# Patient Record
Sex: Female | Born: 1945
Health system: Southern US, Community
[De-identification: ages and names within clinical notes are randomized; demographics above are authoritative.]

## PROBLEM LIST (undated history)

## (undated) DIAGNOSIS — C801 Malignant (primary) neoplasm, unspecified: Secondary | ICD-10-CM

## (undated) DIAGNOSIS — E785 Hyperlipidemia, unspecified: Secondary | ICD-10-CM

## (undated) DIAGNOSIS — M1990 Unspecified osteoarthritis, unspecified site: Secondary | ICD-10-CM

## (undated) DIAGNOSIS — Z85828 Personal history of other malignant neoplasm of skin: Secondary | ICD-10-CM

## (undated) DIAGNOSIS — K219 Gastro-esophageal reflux disease without esophagitis: Secondary | ICD-10-CM

## (undated) DIAGNOSIS — K259 Gastric ulcer, unspecified as acute or chronic, without hemorrhage or perforation: Secondary | ICD-10-CM

## (undated) DIAGNOSIS — I499 Cardiac arrhythmia, unspecified: Secondary | ICD-10-CM

## (undated) DIAGNOSIS — J45909 Unspecified asthma, uncomplicated: Secondary | ICD-10-CM

## (undated) DIAGNOSIS — T7840XA Allergy, unspecified, initial encounter: Secondary | ICD-10-CM

## (undated) HISTORY — DX: Malignant (primary) neoplasm, unspecified: C80.1

## (undated) HISTORY — DX: Gastric ulcer, unspecified as acute or chronic, without hemorrhage or perforation: K25.9

## (undated) HISTORY — PX: ABDOMINAL HYSTERECTOMY: SHX81

## (undated) HISTORY — DX: Unspecified osteoarthritis, unspecified site: M19.90

## (undated) HISTORY — PX: COLONOSCOPY: SHX174

## (undated) HISTORY — DX: Allergy, unspecified, initial encounter: T78.40XA

## (undated) HISTORY — DX: Gastro-esophageal reflux disease without esophagitis: K21.9

## (undated) HISTORY — DX: Hyperlipidemia, unspecified: E78.5

## (undated) HISTORY — PX: CATARACT EXTRACTION: SUR2

## (undated) HISTORY — DX: Unspecified asthma, uncomplicated: J45.909

## (undated) HISTORY — DX: Personal history of other malignant neoplasm of skin: Z85.828

---

## 2010-03-05 DIAGNOSIS — M47812 Spondylosis without myelopathy or radiculopathy, cervical region: Secondary | ICD-10-CM | POA: Insufficient documentation

## 2014-12-12 ENCOUNTER — Encounter: Payer: Self-pay | Admitting: Internal Medicine

## 2014-12-12 ENCOUNTER — Ambulatory Visit (INDEPENDENT_AMBULATORY_CARE_PROVIDER_SITE_OTHER): Payer: Medicare PPO | Admitting: Internal Medicine

## 2014-12-12 ENCOUNTER — Telehealth: Payer: Self-pay | Admitting: Internal Medicine

## 2014-12-12 VITALS — BP 138/82 | HR 62 | Temp 98.0°F | Resp 14 | Ht 64.0 in | Wt 136.4 lb

## 2014-12-12 DIAGNOSIS — E785 Hyperlipidemia, unspecified: Secondary | ICD-10-CM

## 2014-12-12 DIAGNOSIS — Z780 Asymptomatic menopausal state: Secondary | ICD-10-CM | POA: Diagnosis not present

## 2014-12-12 DIAGNOSIS — Z23 Encounter for immunization: Secondary | ICD-10-CM | POA: Diagnosis not present

## 2014-12-12 DIAGNOSIS — K219 Gastro-esophageal reflux disease without esophagitis: Secondary | ICD-10-CM

## 2014-12-12 DIAGNOSIS — Z85828 Personal history of other malignant neoplasm of skin: Secondary | ICD-10-CM

## 2014-12-12 MED ORDER — TRETINOIN 0.025 % EX CREA: TOPICAL_CREAM | Freq: Every day | CUTANEOUS | Status: DC

## 2014-12-12 MED ORDER — ESTRADIOL 0.1 MG/GM VA CREA: TOPICAL_CREAM | VAGINAL | Status: DC

## 2014-12-12 MED ORDER — AZELAIC ACID 15 % EX GEL: CUTANEOUS | Status: DC

## 2014-12-12 MED ORDER — SIMVASTATIN 5 MG PO TABS: 5.0000 mg | ORAL_TABLET | Freq: Every day | ORAL | Status: DC

## 2014-12-12 MED ORDER — AZELAIC ACID 15 % EX FOAM: 15.0000 g | Freq: Two times a day (BID) | CUTANEOUS | Status: DC

## 2014-12-12 NOTE — Progress Notes (Signed)
Pre visit review using our clinic review tool, if applicable. No additional management support is needed unless otherwise documented below in the visit note. 

## 2014-12-12 NOTE — Telephone Encounter (Signed)
Pharmacy needs the prescription for Azelaic Acid (FINACEA) 15 % cream [id number] to be a gel or foam. She states the cream doesn't exist. [facility] [employer] [facility] [address]

## 2014-12-12 NOTE — Telephone Encounter (Signed)
Printed and signed.  

## 2014-12-12 NOTE — Patient Instructions (Signed)
We have sent in the refills and will geHealth Maintenance, Female Adopting a healthy lifestyle and getting preventive care can go a long way to promote health and wellness. Talk with your health care provider about what schedule of regular examinations is right for you. This is a good chance for you to check in with your provider about disease prevention and staying healthy. In between checkups, there are plenty of things you can do on your own. Experts have done a lot of research about which lifestyle changes and preventive measures are most likely to keep you healthy. Ask your health care provider for more information. WEIGHT AND DIET  Eat a healthy diet  Be sure to include plenty of vegetables, fruits, low-fat dairy products, and lean protein.  Do not eat a lot of foods high in solid fats, added sugars, or salt.  Get regular exercise. This is one of the most important things you can do for your health.  Most adults should exercise for at least 150 minutes each week. The exercise should increase your heart rate and make you sweat (moderate-intensity exercise).  Most adults should also do strengthening exercises at least twice a week. This is in addition to the moderate-intensity exercise.  Maintain a healthy weight  Body mass index (BMI) is a measurement that can be used to identify possible weight problems. It estimates body fat based on height and weight. Your health care provider can help determine your BMI and help you achieve or maintain a healthy weight.  For females 42 years of age and older:   A BMI below 18.5 is considered underweight.  A BMI of 18.5 to 24.9 is normal.  A BMI of 25 to 29.9 is considered overweight.  A BMI of 30 and above is considered obese.  Watch levels of cholesterol and blood lipids  You should start having your blood tested for lipids and cholesterol at 71 years of age, then have this test every 5 years.  You may need to have your cholesterol levels  checked more often if:  Your lipid or cholesterol levels are high.  You are older than 71 years of age.  You are at high risk for heart disease.  CANCER SCREENING   Lung Cancer  Lung cancer screening is recommended for adults 67-66 years old who are at high risk for lung cancer because of a history of smoking.  A yearly low-dose CT scan of the lungs is recommended for people who:  Currently smoke.  Have quit within the past 15 years.  Have at least a 30-pack-year history of smoking. A pack year is smoking an average of one pack of cigarettes a day for 1 year.  Yearly screening should continue until it has been 15 years since you quit.  Yearly screening should stop if you develop a health problem that would prevent you from having lung cancer treatment.  Breast Cancer  Practice breast self-awareness. This means understanding how your breasts normally appear and feel.  It also means doing regular breast self-exams. Let your health care provider know about any changes, no matter how small.  If you are in your 20s or 30s, you should have a clinical breast exam (CBE) by a health care provider every 1-3 years as part of a regular health exam.  If you are 18 or older, have a CBE every year. Also consider having a breast X-ray (mammogram) every year.  If you have a family history of breast cancer, talk to your health  care provider about genetic screening.  If you are at high risk for breast cancer, talk to your health care provider about having an MRI and a mammogram every year.  Breast cancer gene (BRCA) assessment is recommended for women who have family members with BRCA-related cancers. BRCA-related cancers include:  Breast.  Ovarian.  Tubal.  Peritoneal cancers.  Results of the assessment will determine the need for genetic counseling and BRCA1 and BRCA2 testing. Cervical Cancer Your health care provider may recommend that you be screened regularly for cancer of the  pelvic organs (ovaries, uterus, and vagina). This screening involves a pelvic examination, including checking for microscopic changes to the surface of your cervix (Pap test). You may be encouraged to have this screening done every 3 years, beginning at age 74.  For women ages 37-65, health care providers may recommend pelvic exams and Pap testing every 3 years, or they may recommend the Pap and pelvic exam, combined with testing for human papilloma virus (HPV), every 5 years. Some types of HPV increase your risk of cervical cancer. Testing for HPV may also be done on women of any age with unclear Pap test results.  Other health care providers may not recommend any screening for nonpregnant women who are considered low risk for pelvic cancer and who do not have symptoms. Ask your health care provider if a screening pelvic exam is right for you.  If you have had past treatment for cervical cancer or a condition that could lead to cancer, you need Pap tests and screening for cancer for at least 20 years after your treatment. If Pap tests have been discontinued, your risk factors (such as having a new sexual partner) need to be reassessed to determine if screening should resume. Some women have medical problems that increase the chance of getting cervical cancer. In these cases, your health care provider may recommend more frequent screening and Pap tests. Colorectal Cancer  This type of cancer can be detected and often prevented.  Routine colorectal cancer screening usually begins at 71 years of age and continues through 71 years of age.  Your health care provider may recommend screening at an earlier age if you have risk factors for colon cancer.  Your health care provider may also recommend using home test kits to check for hidden blood in the stool.  A small camera at the end of a tube can be used to examine your colon directly (sigmoidoscopy or colonoscopy). This is done to check for the earliest  forms of colorectal cancer.  Routine screening usually begins at age 61.  Direct examination of the colon should be repeated every 5-10 years through 71 years of age. However, you may need to be screened more often if early forms of precancerous polyps or small growths are found. Skin Cancer  Check your skin from head to toe regularly.  Tell your health care provider about any new moles or changes in moles, especially if there is a change in a mole's shape or color.  Also tell your health care provider if you have a mole that is larger than the size of a pencil eraser.  Always use sunscreen. Apply sunscreen liberally and repeatedly throughout the day.  Protect yourself by wearing long sleeves, pants, a wide-brimmed hat, and sunglasses whenever you are outside. HEART DISEASE, DIABETES, AND HIGH BLOOD PRESSURE   High blood pressure causes heart disease and increases the risk of stroke. High blood pressure is more likely to develop in:  People  who have blood pressure in the high end of the normal range (130-139/85-89 mm Hg).  People who are overweight or obese.  People who are African American.  If you are 73-54 years of age, have your blood pressure checked every 3-5 years. If you are 55 years of age or older, have your blood pressure checked every year. You should have your blood pressure measured twice--once when you are at a hospital or clinic, and once when you are not at a hospital or clinic. Record the average of the two measurements. To check your blood pressure when you are not at a hospital or clinic, you can use:  An automated blood pressure machine at a pharmacy.  A home blood pressure monitor.  If you are between 11 years and 71 years old, ask your health care provider if you should take aspirin to prevent strokes.  Have regular diabetes screenings. This involves taking a blood sample to check your fasting blood sugar level.  If you are at a normal weight and have a low  risk for diabetes, have this test once every three years after 71 years of age.  If you are overweight and have a high risk for diabetes, consider being tested at a younger age or more often. PREVENTING INFECTION  Hepatitis B  If you have a higher risk for hepatitis B, you should be screened for this virus. You are considered at high risk for hepatitis B if:  You were born in a country where hepatitis B is common. Ask your health care provider which countries are considered high risk.  Your parents were born in a high-risk country, and you have not been immunized against hepatitis B (hepatitis B vaccine).  You have HIV or AIDS.  You use needles to inject street drugs.  You live with someone who has hepatitis B.  You have had sex with someone who has hepatitis B.  You get hemodialysis treatment.  You take certain medicines for conditions, including cancer, organ transplantation, and autoimmune conditions. Hepatitis C  Blood testing is recommended for:  Everyone born from [birth year] through 1967.  Anyone with known risk factors for hepatitis C. Sexually transmitted infections (STIs)  You should be screened for sexually transmitted infections (STIs) including gonorrhea and chlamydia if:  You are sexually active and are younger than 71 years of age.  You are older than 71 years of age and your health care provider tells you that you are at risk for this type of infection.  Your sexual activity has changed since you were last screened and you are at an increased risk for chlamydia or gonorrhea. Ask your health care provider if you are at risk.  If you do not have HIV, but are at risk, it may be recommended that you take a prescription medicine daily to prevent HIV infection. This is called pre-exposure prophylaxis (PrEP). You are considered at risk if:  You are sexually active and do not regularly use condoms or know the HIV status of your partner(s).  You take drugs by  injection.  You are sexually active with a partner who has HIV. Talk with your health care provider about whether you are at high risk of being infected with HIV. If you choose to begin PrEP, you should first be tested for HIV. You should then be tested every 3 months for as long as you are taking PrEP.  PREGNANCY   If you are premenopausal and you may become pregnant, ask your health care  provider about preconception counseling.  If you may become pregnant, take 400 to 800 micrograms (mcg) of folic acid every day.  If you want to prevent pregnancy, talk to your health care provider about birth control (contraception). OSTEOPOROSIS AND MENOPAUSE   Osteoporosis is a disease in which the bones lose minerals and strength with aging. This can result in serious bone fractures. Your risk for osteoporosis can be identified using a bone density scan.  If you are 80 years of age or older, or if you are at risk for osteoporosis and fractures, ask your health care provider if you should be screened.  Ask your health care provider whether you should take a calcium or vitamin D supplement to lower your risk for osteoporosis.  Menopause may have certain physical symptoms and risks.  Hormone replacement therapy may reduce some of these symptoms and risks. Talk to your health care provider about whether hormone replacement therapy is right for you.  HOME CARE INSTRUCTIONS   Schedule regular health, dental, and eye exams.  Stay current with your immunizations.   Do not use any tobacco products including cigarettes, chewing tobacco, or electronic cigarettes.  If you are pregnant, do not drink alcohol.  If you are breastfeeding, limit how much and how often you drink alcohol.  Limit alcohol intake to no more than 1 drink per day for nonpregnant women. One drink equals 12 ounces of beer, 5 ounces of wine, or 1 ounces of hard liquor.  Do not use street drugs.  Do not share needles.  Ask your  health care provider for help if you need support or information about quitting drugs.  Tell your health care provider if you often feel depressed.  Tell your health care provider if you have ever been abused or do not feel safe at home.   This information is not intended to replace advice given to you by your health care provider. Make sure you discuss any questions you have with your health care provider.   Document Released: 07/06/2010 Document Revised: 01/11/2014 Document Reviewed: 11/22/2012 Elsevier Interactive Patient Education [employer]. t you in with dermatology.   We have ordered the mammogram and you will hear back about scheduling that.   Come back in about 6-12 months for a physical and please feel free to call us sooner if you have any problems or questions.

## 2014-12-13 DIAGNOSIS — K219 Gastro-esophageal reflux disease without esophagitis: Secondary | ICD-10-CM | POA: Insufficient documentation

## 2014-12-13 DIAGNOSIS — Z780 Asymptomatic menopausal state: Secondary | ICD-10-CM | POA: Insufficient documentation

## 2014-12-13 DIAGNOSIS — Z85828 Personal history of other malignant neoplasm of skin: Secondary | ICD-10-CM | POA: Insufficient documentation

## 2014-12-13 DIAGNOSIS — E785 Hyperlipidemia, unspecified: Secondary | ICD-10-CM | POA: Insufficient documentation

## 2014-12-13 NOTE — Progress Notes (Signed)
   Subjective:    Patient ID: Brittany Harper, female    DOB: 1945/10/16, 69 y.o.   MRN: [id number]  HPI The patient is a 69 YO female coming in 69 y.o. new for continuity of her medical concerns. She moved from [place] to live in [place]. She has had high cholesterol for some time. She is taking simvastatin with good success and no side effects. Last cholesterol checked in the spring of this year. Please see A/P for status and treatment of other chronic medical problems. No new concerns today.   PMH, [facility], social history reviewed and updated.   Review of Systems  Constitutional: Negative for fever, activity change, appetite change, fatigue and unexpected weight change.  HENT: Negative.   Eyes: Negative.   Respiratory: Negative for cough, chest tightness, shortness of breath and wheezing.   Cardiovascular: Negative for chest pain, palpitations and leg swelling.  Gastrointestinal: Negative for nausea, abdominal pain, diarrhea, constipation and abdominal distention.  Musculoskeletal: Negative.   Skin: Negative.   Neurological: Negative.   Psychiatric/Behavioral: Negative.       Objective:   Physical Exam  Constitutional: She is oriented to person, place, and time. She appears well-developed and well-nourished.  HENT:  Head: Normocephalic and atraumatic.  Eyes: EOM are normal.  Neck: Normal range of motion.  Cardiovascular: Normal rate and regular rhythm.   Pulmonary/Chest: Effort normal and breath sounds normal. No respiratory distress. She has no wheezes. She has no rales.  Abdominal: Soft. Bowel sounds are normal. She exhibits no distension. There is no tenderness. There is no rebound.  Musculoskeletal: She exhibits no edema.  Neurological: She is alert and oriented to person, place, and time. Coordination normal.  Skin: Skin is warm and dry.  Psychiatric: She has a normal mood and affect.   Filed Vitals:   12/12/14 0834  BP: 138/82  Pulse: 62  Temp: 98 F (36.7 C)  TempSrc: Oral    Resp: 14  Height: 5\' 4"  (1.626 m)  Weight: 136 lb 6.4 oz (61.871 kg)  SpO2: 98%      Assessment & Plan:  Prevnar 13 given at visit.

## 2014-12-13 NOTE — Assessment & Plan Note (Signed)
Estrogen vaginal cream prescribed at today's visit for urinary symptoms (helps with her incontinence).

## 2014-12-13 NOTE — Assessment & Plan Note (Signed)
Cholesterol controlled on simvastatin 5 mg daily, reviewed most recent labs. No side effects. Refilled at today's visit.

## 2014-12-13 NOTE — Assessment & Plan Note (Signed)
Previously on omeprazole with good results. Now using zantac once daily with okay results (she was concerned about the studies that came out recently about the omeprazole).

## 2014-12-13 NOTE — Assessment & Plan Note (Signed)
No new or worrisome moles but likes to get yearly skin exam, referred to dermatology. She does use sunscreen when outdoors and reminded her of the importance of this.

## 2014-12-20 ENCOUNTER — Other Ambulatory Visit: Payer: Self-pay

## 2014-12-20 DIAGNOSIS — Z1231 Encounter for screening mammogram for malignant neoplasm of breast: Secondary | ICD-10-CM

## 2015-01-17 ENCOUNTER — Ambulatory Visit
Admission: RE | Admit: 2015-01-17 | Discharge: 2015-01-17 | Disposition: A | Discharge status: Home / Self Care | Payer: Medicare PPO | Source: Ambulatory Visit

## 2015-01-17 DIAGNOSIS — Z1231 Encounter for screening mammogram for malignant neoplasm of breast: Secondary | ICD-10-CM | POA: Diagnosis not present

## 2015-01-24 DIAGNOSIS — D2262 Melanocytic nevi of left upper limb, including shoulder: Secondary | ICD-10-CM | POA: Diagnosis not present

## 2015-01-24 DIAGNOSIS — H01001 Unspecified blepharitis right upper eyelid: Secondary | ICD-10-CM | POA: Diagnosis not present

## 2015-01-24 DIAGNOSIS — L820 Inflamed seborrheic keratosis: Secondary | ICD-10-CM | POA: Diagnosis not present

## 2015-01-24 DIAGNOSIS — L309 Dermatitis, unspecified: Secondary | ICD-10-CM | POA: Diagnosis not present

## 2015-01-24 DIAGNOSIS — H04123 Dry eye syndrome of bilateral lacrimal glands: Secondary | ICD-10-CM | POA: Diagnosis not present

## 2015-01-24 DIAGNOSIS — Z85828 Personal history of other malignant neoplasm of skin: Secondary | ICD-10-CM | POA: Diagnosis not present

## 2015-01-24 DIAGNOSIS — D485 Neoplasm of uncertain behavior of skin: Secondary | ICD-10-CM | POA: Diagnosis not present

## 2015-01-24 DIAGNOSIS — L821 Other seborrheic keratosis: Secondary | ICD-10-CM | POA: Diagnosis not present

## 2015-01-24 DIAGNOSIS — L919 Hypertrophic disorder of the skin, unspecified: Secondary | ICD-10-CM | POA: Diagnosis not present

## 2015-01-24 DIAGNOSIS — H1851 Endothelial corneal dystrophy: Secondary | ICD-10-CM | POA: Diagnosis not present

## 2015-01-24 DIAGNOSIS — H43813 Vitreous degeneration, bilateral: Secondary | ICD-10-CM | POA: Diagnosis not present

## 2015-06-12 ENCOUNTER — Ambulatory Visit: Payer: Medicare PPO | Admitting: Internal Medicine

## 2015-06-23 ENCOUNTER — Ambulatory Visit: Payer: Medicare PPO | Admitting: Internal Medicine

## 2015-07-17 ENCOUNTER — Encounter: Payer: Self-pay | Admitting: Internal Medicine

## 2015-07-17 ENCOUNTER — Ambulatory Visit (INDEPENDENT_AMBULATORY_CARE_PROVIDER_SITE_OTHER): Payer: Medicare PPO | Admitting: Internal Medicine

## 2015-07-17 ENCOUNTER — Other Ambulatory Visit (INDEPENDENT_AMBULATORY_CARE_PROVIDER_SITE_OTHER): Payer: Medicare PPO

## 2015-07-17 VITALS — BP 128/76 | HR 62 | Temp 98.7°F | Resp 14 | Ht 64.0 in | Wt 144.0 lb

## 2015-07-17 DIAGNOSIS — E785 Hyperlipidemia, unspecified: Secondary | ICD-10-CM

## 2015-07-17 DIAGNOSIS — Z1159 Encounter for screening for other viral diseases: Secondary | ICD-10-CM

## 2015-07-17 DIAGNOSIS — K219 Gastro-esophageal reflux disease without esophagitis: Secondary | ICD-10-CM

## 2015-07-17 DIAGNOSIS — E2839 Other primary ovarian failure: Secondary | ICD-10-CM

## 2015-07-17 LAB — COMPREHENSIVE METABOLIC PANEL
ALK PHOS: 57 U/L (ref 39–117)
ALT: 13 U/L (ref 0–35)
AST: 16 U/L (ref 0–37)
Albumin: 4.4 g/dL (ref 3.5–5.2)
BUN: 17 mg/dL (ref 6–23)
CHLORIDE: 102 meq/L (ref 96–112)
CO2: 34 mEq/L — ABNORMAL HIGH (ref 19–32)
Calcium: 10.2 mg/dL (ref 8.4–10.5)
Creatinine, Ser: 0.92 mg/dL (ref 0.40–1.20)
GFR: 63.83 mL/min (ref >60.00)
GLUCOSE: 93 mg/dL (ref 70–99)
POTASSIUM: 4.4 meq/L (ref 3.5–5.1)
Sodium: 142 mEq/L (ref 135–145)
Total Bilirubin: 0.4 mg/dL (ref 0.2–1.2)
Total Protein: 7.5 g/dL (ref 6.0–8.3)

## 2015-07-17 LAB — LIPID PANEL
CHOL/HDL RATIO: 3
CHOLESTEROL: 189 mg/dL (ref 0–200)
HDL: 68 mg/dL (ref >39.00)
LDL Cholesterol: 94 mg/dL (ref 0–99)
NonHDL: 121.26
TRIGLYCERIDES: 135 mg/dL (ref 0.0–149.0)
VLDL: 27 mg/dL (ref 0.0–40.0)

## 2015-07-17 NOTE — Patient Instructions (Addendum)
It is okay to use tums or maalox also for the heartburn. It is okay to keep taking the pepcid twice a day. It would also be okay to try taking the omeprazole every 2nd or 3rd day to see if this helps with keeping the heartburn away since we do not want you to suffer daily.   The colon test is called cologuard and we recommend you call the insurance company to see how much it would cost you. This test uses DNA to check for polyps or cancer in the colon. It cannot tell us what a positive test means so if the test comes back positive we do get the colonoscopy to find the problem. It has about a 5% miss rate of not finding polyps that could be present and about a 5% false positive rate of being positive when there are really no problems.   We have ordered the bone density and we do those in the basement of our building down close to the lab.   Food Choices for Gastroesophageal Reflux Disease, Adult When you have gastroesophageal reflux disease (GERD), the foods you eat and your eating habits are very important. Choosing the right foods can help ease the discomfort of GERD. WHAT GENERAL GUIDELINES DO I NEED TO FOLLOW?  Choose fruits, vegetables, whole grains, low-fat dairy products, and low-fat meat, fish, and poultry.  Limit fats such as oils, salad dressings, butter, nuts, and avocado.  Keep a food diary to identify foods that cause symptoms.  Avoid foods that cause reflux. These may be different for different people.  Eat frequent small meals instead of three large meals each day.  Eat your meals slowly, in a relaxed setting.  Limit fried foods.  Cook foods using methods other than frying.  Avoid drinking alcohol.  Avoid drinking large amounts of liquids with your meals.  Avoid bending over or lying down until 2-3 hours after eating. WHAT FOODS ARE NOT RECOMMENDED? The following are some foods and drinks that may worsen your symptoms: Vegetables Tomatoes. Tomato juice. Tomato and  spaghetti sauce. Chili peppers. Onion and garlic. Horseradish. Fruits Oranges, grapefruit, and lemon (fruit and juice). Meats High-fat meats, fish, and poultry. This includes hot dogs, ribs, ham, sausage, salami, and bacon. Dairy Whole milk and chocolate milk. Sour cream. Cream. Butter. Ice cream. Cream cheese.  Beverages Coffee and tea, with or without caffeine. Carbonated beverages or energy drinks. Condiments Hot sauce. Barbecue sauce.  Sweets/Desserts Chocolate and cocoa. Donuts. Peppermint and spearmint. Fats and Oils High-fat foods, including [place] fries and potato chips. Other Vinegar. Strong spices, such as black pepper, white pepper, red pepper, cayenne, curry powder, cloves, ginger, and chili powder. The items listed above may not be a complete list of foods and beverages to avoid. Contact your dietitian for more information.   This information is not intended to replace advice given to you by your health care provider. Make sure you discuss any questions you have with your health care provider.   Document Released: 12/21/2004 Document Revised: 01/11/2014 Document Reviewed: 10/25/2012 Elsevier Interactive Patient Education [employer].

## 2015-07-17 NOTE — Assessment & Plan Note (Signed)
She is using pepcid BID and can use tums prn as well. She wishes to avoid omeprazole but we talked today about taking every 2nd or 3rd day to see if this helps.

## 2015-07-17 NOTE — Assessment & Plan Note (Signed)
Checking lipid panel and CMP today. Taking simvastatin 5 mg daily. Adjust as needed.

## 2015-07-17 NOTE — Progress Notes (Signed)
   Subjective:    Patient ID: Brittany Harper, female    DOB: [birth year], 70 y.o.   MRN: [id number]  HPI The patient is a 70 YO female coming in for follow up of her cholesterol. She normally gets it checked this time of year. Doing well with the simvastatin and no problems or side effects. No new complaints. No chest pains, headaches, muscle aches. Having some mild GERD symptoms. Has tried otc zantac and pepcid with partial relief. Does not want to go back on omeprazole due to negative perception of the medicine class and side effects.   Review of Systems  Constitutional: Negative for fever, activity change, appetite change, fatigue and unexpected weight change.  HENT: Negative.   Eyes: Negative.   Respiratory: Negative for cough, chest tightness, shortness of breath and wheezing.   Cardiovascular: Negative for chest pain, palpitations and leg swelling.  Gastrointestinal: Negative for nausea, abdominal pain, diarrhea, constipation and abdominal distention.  Musculoskeletal: Negative.   Skin: Negative.   Neurological: Negative.   Psychiatric/Behavioral: Negative.       Objective:   Physical Exam  Constitutional: She is oriented to person, place, and time. She appears well-developed and well-nourished.  HENT:  Head: Normocephalic and atraumatic.  Eyes: EOM are normal.  Neck: Normal range of motion.  Cardiovascular: Normal rate and regular rhythm.   Pulmonary/Chest: Effort normal and breath sounds normal. No respiratory distress. She has no wheezes. She has no rales.  Abdominal: Soft. Bowel sounds are normal. She exhibits no distension. There is no tenderness. There is no rebound.  Musculoskeletal: She exhibits no edema.  Neurological: She is alert and oriented to person, place, and time. Coordination normal.  Skin: Skin is warm and dry.   Filed Vitals:   07/17/15 0828  BP: 128/76  Pulse: 62  Temp: 98.7 F (37.1 C)  TempSrc: Oral  Resp: 14  Height: 5\' 4"  (1.626 m)  Weight: 144 lb  (65.318 kg)  SpO2: 97%      Assessment & Plan:

## 2015-07-17 NOTE — Progress Notes (Signed)
Pre visit review using our clinic review tool, if applicable. No additional management support is needed unless otherwise documented below in the visit note. 

## 2015-07-18 LAB — HEPATITIS C ANTIBODY: HCV Ab: NEGATIVE

## 2015-08-19 ENCOUNTER — Telehealth: Payer: Self-pay | Admitting: Internal Medicine

## 2015-08-19 NOTE — Telephone Encounter (Signed)
Pt stated Dr. Sharlet Salina suggested her to get cologaurd done. Her insurance will cover for this. Please order the cologaurd for pt.

## 2015-08-19 NOTE — Telephone Encounter (Signed)
Thanks, we will get that ordered.

## 2015-08-28 ENCOUNTER — Ambulatory Visit (INDEPENDENT_AMBULATORY_CARE_PROVIDER_SITE_OTHER)
Admission: RE | Admit: 2015-08-28 | Discharge: 2015-08-28 | Disposition: A | Discharge status: Home / Self Care | Payer: Medicare PPO | Source: Ambulatory Visit | Attending: Internal Medicine | Admitting: Internal Medicine

## 2015-08-28 DIAGNOSIS — E2839 Other primary ovarian failure: Secondary | ICD-10-CM | POA: Diagnosis not present

## 2015-09-01 DIAGNOSIS — Z1212 Encounter for screening for malignant neoplasm of rectum: Secondary | ICD-10-CM | POA: Diagnosis not present

## 2015-09-01 DIAGNOSIS — Z1211 Encounter for screening for malignant neoplasm of colon: Secondary | ICD-10-CM | POA: Diagnosis not present

## 2015-09-01 LAB — COLOGUARD: Cologuard: NEGATIVE

## 2015-10-24 ENCOUNTER — Telehealth: Payer: Self-pay | Admitting: Internal Medicine

## 2015-10-24 NOTE — Telephone Encounter (Signed)
Patient states that she has not gotten a call with cologuard results from August.  Please follow up in regard.

## 2015-10-28 NOTE — Telephone Encounter (Signed)
Tried to reach patient, no answer

## 2015-11-12 ENCOUNTER — Telehealth: Payer: Self-pay | Admitting: Emergency Medicine

## 2015-11-12 NOTE — Telephone Encounter (Signed)
Pt called wants to know if you can call her in regards to her cologuard test. Please advise thanks.

## 2015-11-14 NOTE — Telephone Encounter (Signed)
Tried to reach patient, no answer

## 2015-12-11 ENCOUNTER — Other Ambulatory Visit: Payer: Self-pay | Admitting: Internal Medicine

## 2015-12-11 DIAGNOSIS — Z1231 Encounter for screening mammogram for malignant neoplasm of breast: Secondary | ICD-10-CM

## 2015-12-15 ENCOUNTER — Ambulatory Visit (INDEPENDENT_AMBULATORY_CARE_PROVIDER_SITE_OTHER): Payer: Medicare PPO | Admitting: Internal Medicine

## 2015-12-15 ENCOUNTER — Encounter: Payer: Self-pay | Admitting: Internal Medicine

## 2015-12-15 VITALS — BP 132/78 | HR 59 | Temp 98.1°F | Resp 14 | Ht 63.5 in | Wt 142.8 lb

## 2015-12-15 DIAGNOSIS — E785 Hyperlipidemia, unspecified: Secondary | ICD-10-CM | POA: Diagnosis not present

## 2015-12-15 DIAGNOSIS — Z23 Encounter for immunization: Secondary | ICD-10-CM | POA: Diagnosis not present

## 2015-12-15 DIAGNOSIS — K219 Gastro-esophageal reflux disease without esophagitis: Secondary | ICD-10-CM | POA: Diagnosis not present

## 2015-12-15 DIAGNOSIS — Z0000 Encounter for general adult medical examination without abnormal findings: Secondary | ICD-10-CM | POA: Diagnosis not present

## 2015-12-15 DIAGNOSIS — Z780 Asymptomatic menopausal state: Secondary | ICD-10-CM | POA: Diagnosis not present

## 2015-12-15 MED ORDER — SIMVASTATIN 5 MG PO TABS: 5.0000 mg | ORAL_TABLET | Freq: Every day | ORAL | 3 refills | Status: DC

## 2015-12-15 MED ORDER — APOAEQUORIN 10 MG PO CAPS: 10.0000 mg | ORAL_CAPSULE | Freq: Every day | ORAL | 3 refills | Status: DC

## 2015-12-15 MED ORDER — RANITIDINE HCL 300 MG PO TABS: 300.0000 mg | ORAL_TABLET | Freq: Two times a day (BID) | ORAL | 3 refills | Status: DC

## 2015-12-15 NOTE — Assessment & Plan Note (Signed)
Using estrace cream twice a week for symptoms and this is doing well.

## 2015-12-15 NOTE — Assessment & Plan Note (Signed)
Taking simvastatin 5 mg daily and will continue, last LDL at goal and no side effects.

## 2015-12-15 NOTE — Progress Notes (Signed)
Pre visit review using our clinic review tool, if applicable. No additional management support is needed unless otherwise documented below in the visit note. 

## 2015-12-15 NOTE — Patient Instructions (Signed)
We have sent in the prescription strength of the zantac.   We have sent in the prevagen to try (1 pill daily) for 1 month to see if you notice a difference.   Keep up the good work with the walking.   Health Maintenance, Female Introduction Adopting a healthy lifestyle and getting preventive care can go a long way to promote health and wellness. Talk with your health care provider about what schedule of regular examinations is right for you. This is a good chance for you to check in with your provider about disease prevention and staying healthy. In between checkups, there are plenty of things you can do on your own. Experts have done a lot of research about which lifestyle changes and preventive measures are most likely to keep you healthy. Ask your health care provider for more information. Weight and diet Eat a healthy diet  Be sure to include plenty of vegetables, fruits, low-fat dairy products, and lean protein.  Do not eat a lot of foods high in solid fats, added sugars, or salt.  Get regular exercise. This is one of the most important things you can do for your health.  Most adults should exercise for at least 150 minutes each week. The exercise should increase your heart rate and make you sweat (moderate-intensity exercise).  Most adults should also do strengthening exercises at least twice a week. This is in addition to the moderate-intensity exercise. Maintain a healthy weight  Body mass index (BMI) is a measurement that can be used to identify possible weight problems. It estimates body fat based on height and weight. Your health care provider can help determine your BMI and help you achieve or maintain a healthy weight.  For females 50 years of age and older:  A BMI below 18.5 is considered underweight.  A BMI of 18.5 to 24.9 is normal.  A BMI of 25 to 29.9 is considered overweight.  A BMI of 30 and above is considered obese. Watch levels of cholesterol and blood  lipids  You should start having your blood tested for lipids and cholesterol at 72 years of age, then have this test every 5 years.  You may need to have your cholesterol levels checked more often if:  Your lipid or cholesterol levels are high.  You are older than 72 years of age.  You are at high risk for heart disease. Cancer screening Lung Cancer  Lung cancer screening is recommended for adults 77-80 years old who are at high risk for lung cancer because of a history of smoking.  A yearly low-dose CT scan of the lungs is recommended for people who:  Currently smoke.  Have quit within the past 15 years.  Have at least a 30-pack-year history of smoking. A pack year is smoking an average of one pack of cigarettes a day for 1 year.  Yearly screening should continue until it has been 15 years since you quit.  Yearly screening should stop if you develop a health problem that would prevent you from having lung cancer treatment. Breast Cancer  Practice breast self-awareness. This means understanding how your breasts normally appear and feel.  It also means doing regular breast self-exams. Let your health care provider know about any changes, no matter how small.  If you are in your 20s or 30s, you should have a clinical breast exam (CBE) by a health care provider every 1-3 years as part of a regular health exam.  If you are 40  or older, have a CBE every year. Also consider having a breast X-ray (mammogram) every year.  If you have a family history of breast cancer, talk to your health care provider about genetic screening.  If you are at high risk for breast cancer, talk to your health care provider about having an MRI and a mammogram every year.  Breast cancer gene (BRCA) assessment is recommended for women who have family members with BRCA-related cancers. BRCA-related cancers include:  Breast.  Ovarian.  Tubal.  Peritoneal cancers.  Results of the assessment will  determine the need for genetic counseling and BRCA1 and BRCA2 testing. Cervical Cancer  Your health care provider may recommend that you be screened regularly for cancer of the pelvic organs (ovaries, uterus, and vagina). This screening involves a pelvic examination, including checking for microscopic changes to the surface of your cervix (Pap test). You may be encouraged to have this screening done every 3 years, beginning at age 41.  For women ages 41-65, health care providers may recommend pelvic exams and Pap testing every 3 years, or they may recommend the Pap and pelvic exam, combined with testing for human papilloma virus (HPV), every 5 years. Some types of HPV increase your risk of cervical cancer. Testing for HPV may also be done on women of any age with unclear Pap test results.  Other health care providers may not recommend any screening for nonpregnant women who are considered low risk for pelvic cancer and who do not have symptoms. Ask your health care provider if a screening pelvic exam is right for you.  If you have had past treatment for cervical cancer or a condition that could lead to cancer, you need Pap tests and screening for cancer for at least 20 years after your treatment. If Pap tests have been discontinued, your risk factors (such as having a new sexual partner) need to be reassessed to determine if screening should resume. Some women have medical problems that increase the chance of getting cervical cancer. In these cases, your health care provider may recommend more frequent screening and Pap tests. Colorectal Cancer  This type of cancer can be detected and often prevented.  Routine colorectal cancer screening usually begins at 72 years of age and continues through 72 years of age.  Your health care provider may recommend screening at an earlier age if you have risk factors for colon cancer.  Your health care provider may also recommend using home test kits to check for  hidden blood in the stool.  A small camera at the end of a tube can be used to examine your colon directly (sigmoidoscopy or colonoscopy). This is done to check for the earliest forms of colorectal cancer.  Routine screening usually begins at age 33.  Direct examination of the colon should be repeated every 5-10 years through 72 years of age. However, you may need to be screened more often if early forms of precancerous polyps or small growths are found. Skin Cancer  Check your skin from head to toe regularly.  Tell your health care provider about any new moles or changes in moles, especially if there is a change in a mole's shape or color.  Also tell your health care provider if you have a mole that is larger than the size of a pencil eraser.  Always use sunscreen. Apply sunscreen liberally and repeatedly throughout the day.  Protect yourself by wearing long sleeves, pants, a wide-brimmed hat, and sunglasses whenever you are outside. Heart  disease, diabetes, and high blood pressure  High blood pressure causes heart disease and increases the risk of stroke. High blood pressure is more likely to develop in:  People who have blood pressure in the high end of the normal range (130-139/85-89 mm Hg).  People who are overweight or obese.  People who are African American.  If you are 52-58 years of age, have your blood pressure checked every 3-5 years. If you are 83 years of age or older, have your blood pressure checked every year. You should have your blood pressure measured twice-once when you are at a hospital or clinic, and once when you are not at a hospital or clinic. Record the average of the two measurements. To check your blood pressure when you are not at a hospital or clinic, you can use:  An automated blood pressure machine at a pharmacy.  A home blood pressure monitor.  If you are between 6 years and 76 years old, ask your health care provider if you should take aspirin to  prevent strokes.  Have regular diabetes screenings. This involves taking a blood sample to check your fasting blood sugar level.  If you are at a normal weight and have a low risk for diabetes, have this test once every three years after 72 years of age.  If you are overweight and have a high risk for diabetes, consider being tested at a younger age or more often. Preventing infection Hepatitis B  If you have a higher risk for hepatitis B, you should be screened for this virus. You are considered at high risk for hepatitis B if:  You were born in a country where hepatitis B is common. Ask your health care provider which countries are considered high risk.  Your parents were born in a high-risk country, and you have not been immunized against hepatitis B (hepatitis B vaccine).  You have HIV or AIDS.  You use needles to inject street drugs.  You live with someone who has hepatitis B.  You have had sex with someone who has hepatitis B.  You get hemodialysis treatment.  You take certain medicines for conditions, including cancer, organ transplantation, and autoimmune conditions. Hepatitis C  Blood testing is recommended for:  Everyone born from [birth year] through 1967.  Anyone with known risk factors for hepatitis C. Sexually transmitted infections (STIs)  You should be screened for sexually transmitted infections (STIs) including gonorrhea and chlamydia if:  You are sexually active and are younger than 72 years of age.  You are older than 72 years of age and your health care provider tells you that you are at risk for this type of infection.  Your sexual activity has changed since you were last screened and you are at an increased risk for chlamydia or gonorrhea. Ask your health care provider if you are at risk.  If you do not have HIV, but are at risk, it may be recommended that you take a prescription medicine daily to prevent HIV infection. This is called pre-exposure  prophylaxis (PrEP). You are considered at risk if:  You are sexually active and do not regularly use condoms or know the HIV status of your partner(s).  You take drugs by injection.  You are sexually active with a partner who has HIV. Talk with your health care provider about whether you are at high risk of being infected with HIV. If you choose to begin PrEP, you should first be tested for HIV. You should then be  tested every 3 months for as long as you are taking PrEP. Pregnancy  If you are premenopausal and you may become pregnant, ask your health care provider about preconception counseling.  If you may become pregnant, take 400 to 800 micrograms (mcg) of folic acid every day.  If you want to prevent pregnancy, talk to your health care provider about birth control (contraception). Osteoporosis and menopause  Osteoporosis is a disease in which the bones lose minerals and strength with aging. This can result in serious bone fractures. Your risk for osteoporosis can be identified using a bone density scan.  If you are 49 years of age or older, or if you are at risk for osteoporosis and fractures, ask your health care provider if you should be screened.  Ask your health care provider whether you should take a calcium or vitamin D supplement to lower your risk for osteoporosis.  Menopause may have certain physical symptoms and risks.  Hormone replacement therapy may reduce some of these symptoms and risks. Talk to your health care provider about whether hormone replacement therapy is right for you. Follow these instructions at home:  Schedule regular health, dental, and eye exams.  Stay current with your immunizations.  Do not use any tobacco products including cigarettes, chewing tobacco, or electronic cigarettes.  If you are pregnant, do not drink alcohol.  If you are breastfeeding, limit how much and how often you drink alcohol.  Limit alcohol intake to no more than 1 drink  per day for nonpregnant women. One drink equals 12 ounces of beer, 5 ounces of wine, or 1 ounces of hard liquor.  Do not use street drugs.  Do not share needles.  Ask your health care provider for help if you need support or information about quitting drugs.  Tell your health care provider if you often feel depressed.  Tell your health care provider if you have ever been abused or do not feel safe at home. This information is not intended to replace advice given to you by your health care provider. Make sure you discuss any questions you have with your health care provider. Document Released: 07/06/2010 Document Revised: 05/29/2015 Document Reviewed: 09/24/2014  2017 [employer]vier

## 2015-12-15 NOTE — Progress Notes (Signed)
   Subjective:    Patient ID: Brittany Harper, female    DOB: 1945/07/19, 70 y.o.   MRN: [id number]  HPI Here for medicare wellness and physical, no new complaints. Please see A/P for status and treatment of chronic medical problems.   Diet: heart healthy Physical activity: walks 2 miles per day Depression/mood screen: negative Hearing: intact to whispered voice Visual acuity: grossly normal with lens, performs annual eye exam  ADLs: capable Fall risk: none Home safety: good Cognitive evaluation: intact to orientation, naming, recall and repetition EOL planning: adv directives discussed, in place  I have personally reviewed and have noted 1. [name] medical and social history - reviewed today no changes 2. Their use of alcohol, tobacco or illicit drugs 3. Their current medications and supplements 4. [name] functional ability including ADL's, fall risks, home safety risks and hearing or visual impairment. 5. Diet and physical activities 6. Evidence for depression or mood disorders 7. Care team reviewed and updated (available in snapshot)  Review of Systems  Constitutional: Negative.   HENT: Negative.   Eyes: Negative.   Respiratory: Negative for cough, chest tightness and shortness of breath.   Cardiovascular: Negative for chest pain, palpitations and leg swelling.  Gastrointestinal: Negative for abdominal distention, abdominal pain, constipation, diarrhea, nausea and vomiting.       Mild GERD  Musculoskeletal: Negative.   Skin: Negative.   Neurological: Negative.   Psychiatric/Behavioral: Negative.       Objective:   Physical Exam  Constitutional: She is oriented to person, place, and time. She appears well-developed and well-nourished.  HENT:  Head: Normocephalic and atraumatic.  Eyes: EOM are normal.  Neck: Normal range of motion.  Cardiovascular: Normal rate and regular rhythm.   Pulmonary/Chest: Effort normal and breath sounds normal. No respiratory  distress. She has no wheezes. She has no rales.  Abdominal: Soft. Bowel sounds are normal. She exhibits no distension. There is no tenderness. There is no rebound.  Musculoskeletal: She exhibits no edema.  Neurological: She is alert and oriented to person, place, and time. Coordination normal.  Skin: Skin is warm and dry.  Psychiatric: She has a normal mood and affect.   Vitals:   12/15/15 0830  BP: 132/78  Pulse: (!) 59  Resp: 14  Temp: 98.1 F (36.7 C)  TempSrc: Oral  SpO2: 98%  Weight: 142 lb 12.8 oz (64.8 kg)  Height: 5' 3.5" (1.613 m)      Assessment & Plan:  Tdap given at visit.

## 2015-12-15 NOTE — Assessment & Plan Note (Signed)
Given tdap at visit. Flu shot up to date. Pneumonia and shingles completed. Mammogram up to date. Counseled on sun safety and mole surveillance as well as the dangers of distracted driving. Given 10 year screening recommendations.

## 2015-12-15 NOTE — Assessment & Plan Note (Signed)
Wants rx for zantac which she is using for GERD along with modification of her diet.

## 2016-01-19 ENCOUNTER — Ambulatory Visit
Admission: RE | Admit: 2016-01-19 | Discharge: 2016-01-19 | Disposition: A | Discharge status: Home / Self Care | Payer: Medicare PPO | Source: Ambulatory Visit | Attending: Internal Medicine | Admitting: Internal Medicine

## 2016-01-19 DIAGNOSIS — Z1231 Encounter for screening mammogram for malignant neoplasm of breast: Secondary | ICD-10-CM | POA: Diagnosis not present

## 2016-01-26 DIAGNOSIS — H1851 Endothelial corneal dystrophy: Secondary | ICD-10-CM | POA: Diagnosis not present

## 2016-01-26 DIAGNOSIS — Z961 Presence of intraocular lens: Secondary | ICD-10-CM | POA: Diagnosis not present

## 2016-01-26 DIAGNOSIS — H43813 Vitreous degeneration, bilateral: Secondary | ICD-10-CM | POA: Diagnosis not present

## 2016-01-26 DIAGNOSIS — H52203 Unspecified astigmatism, bilateral: Secondary | ICD-10-CM | POA: Diagnosis not present

## 2016-02-03 ENCOUNTER — Encounter: Payer: Self-pay | Admitting: Internal Medicine

## 2016-02-03 ENCOUNTER — Ambulatory Visit (INDEPENDENT_AMBULATORY_CARE_PROVIDER_SITE_OTHER): Payer: Medicare PPO | Admitting: Internal Medicine

## 2016-02-03 VITALS — BP 144/90 | HR 62 | Temp 97.9°F | Resp 12 | Ht 63.5 in | Wt 146.0 lb

## 2016-02-03 DIAGNOSIS — M25511 Pain in right shoulder: Secondary | ICD-10-CM | POA: Insufficient documentation

## 2016-02-03 MED ORDER — TIZANIDINE HCL 2 MG PO TABS: 2.0000 mg | ORAL_TABLET | Freq: Four times a day (QID) | ORAL | 0 refills | Status: DC | PRN

## 2016-02-03 NOTE — Patient Instructions (Signed)
We have sent in tizanidine for the pain which you can take up to 3 times per day as needed.   It is okay to keep taking the advil as well on top of the tizandine.

## 2016-02-03 NOTE — Progress Notes (Signed)
Pre visit review using our clinic review tool, if applicable. No additional management support is needed unless otherwise documented below in the visit note. 

## 2016-02-03 NOTE — Progress Notes (Signed)
   Subjective:    Patient ID: Brittany Harper, female    DOB: Jan 20, 1945, 71 y.o.   MRN: [id number]  HPI The patient is a 71 YO female coming in for right shoulder pain s/p fall. She was walking on brick sidewalk over the weekend and was drinking coffee instead of watching the road and she tripped and fell. She landed on her right knee and right shoulder. She denies significant head injury or LOC. She was able to ambulate afterwards. She is not having problems with her knee. She is having pain in her shoulder. Limiting her ROM as it hurts significantly to move. Especially with over the head motions. She denies neck pain or limitation of ROM in her neck. No numbness or weakness in her arm. No swelling now or after the incident. No rash or bruising on her shoulder.   Review of Systems  Constitutional: Positive for activity change. Negative for appetite change, chills, fatigue, fever and unexpected weight change.  Respiratory: Negative.   Cardiovascular: Negative.   Gastrointestinal: Negative.   Musculoskeletal: Positive for arthralgias and myalgias. Negative for back pain, gait problem, joint swelling, neck pain and neck stiffness.  Skin: Positive for color change.       Some bruising on the right elbow and right knee.       Objective:   Physical Exam  Constitutional: She is oriented to person, place, and time. She appears well-developed and well-nourished.  HENT:  Head: Normocephalic and atraumatic.  Eyes: EOM are normal.  Cardiovascular: Normal rate and regular rhythm.   Pulmonary/Chest: Effort normal and breath sounds normal.  Abdominal: Soft.  Musculoskeletal: She exhibits tenderness.  Pain in the right shoulder, no radiation down the arm. Some pain as well in the right scapular region. No pain in the neck or breast muscle.   Neurological: She is alert and oriented to person, place, and time.  Skin: Skin is warm and dry.   Vitals:   02/03/16 1030  BP: (!) 144/90  Pulse: 62  Resp: 12   Temp: 97.9 F (36.6 C)  TempSrc: Oral  SpO2: 99%  Weight: 146 lb (66.2 kg)  Height: 5' 3.5" (1.613 m)      Assessment & Plan:

## 2016-02-03 NOTE — Assessment & Plan Note (Signed)
Suspect muscular strain but she is concerned about rotator cuff. She is having limitation of ROM in the arm. Preserved ROM of the elbow and wrist without pain with stabilization of the shoulder. No numbness or weakness in the arm.

## 2016-02-14 NOTE — Progress Notes (Signed)
[name] [address], [place] [address] Phone: [phone] Subjective:    I'm seeing this patient by the request  of:  Hoyt Koch, MD   CC: right shoulder pain    [id number]  Brittany Harper is a 71 y.o. female coming in with complaint of right shoulder pain.  Patient discussed the pain as more of a dull, throbbing aching pain. Fell 2 weeks. Patient try to get her separate the right arm. Discuss it as a dull, throbbing aching sensation still. Does have weakness. States that certain movements cause a severe 9 out of 10 pain. States that cannot lift above her shoulder at this time without severe pain. Waking her up at night. No significant radiation down the arm. Does respond well to anti-inflammatories.     Past Medical History:  Diagnosis Date  . Allergy   . Arthritis   . Asthma   . Cancer ([place])    basal cell  . GERD (gastroesophageal reflux disease)   . Hyperlipidemia    Past Surgical History:  Procedure Laterality Date  . ABDOMINAL HYSTERECTOMY     Social History   Social History  . Marital status: Widowed    Spouse name: N/A  . Number of children: N/A  . Years of education: N/A   Social History Main Topics  . Smoking status: Never Smoker  . Smokeless tobacco: Never Used  . Alcohol use No  . Drug use: No  . Sexual activity: Not on file   Other Topics Concern  . Not on file   Social History Narrative  . No narrative on file   Allergies  Allergen Reactions  . Augmentin [Amoxicillin-Pot Clavulanate]   . Demerol [Meperidine]   . Ivp Dye [Iodinated Diagnostic Agents]   . Morphine And Related    Family History  Problem Relation Age of Onset  . Arthritis Mother   . Cancer Mother     breast and ovarian, and basal cell  . Arthritis Father   . Cancer Father     basal cell  . Diabetes Sister   . Diabetes Paternal Uncle   . Diabetes Paternal Grandmother   . Diabetes Paternal Grandfather     Past  medical history, social, surgical and family history all reviewed in electronic medical record.  No pertanent information unless stated regarding to the chief complaint.   Review of Systems:Review of systems updated and as accurate as of 02/14/16  No headache, visual changes, nausea, vomiting, diarrhea, constipation, dizziness, abdominal pain, skin rash, fevers, chills, night sweats, weight loss, swollen lymph nodes, body aches, joint swelling, , chest pain, shortness of breath, mood changes.  Positive for muscle aches  Objective  There were no vitals taken for this visit. Systems examined below as of 02/14/16   General: No apparent distress alert and oriented x3 mood and affect normal, dressed appropriately.  HEENT: Pupils equal, extraocular movements intact  Respiratory: Patient's speak in full sentences and does not appear short of breath  Cardiovascular: No lower extremity edema, non tender, no erythema  Skin: Warm dry intact with no signs of infection or rash on extremities or on axial skeleton.  Abdomen: Soft nontender  Neuro: Cranial nerves II through XII are intact, neurovascularly intact in all extremities with 2+ DTRs and 2+ pulses.  Lymph: No lymphadenopathy of posterior or anterior cervical chain or axillae bilaterally.  Gait normal with good balance and coordination.  MSK:  Non tender with full range of motion  and good stability and symmetric strength and tone of  elbows, wrist, hip, knee and ankles bilaterally.  Shoulder: Right Inspection reveals no abnormalities, atrophy or asymmetry. Palpation is normal with no tenderness over AC joint or bicipital groove. ROM is full in all planes passively. Rotator cuff strength normal throughout. signs of impingement with positive Neer and Hawkin's tests, but negative empty can sign. Speeds and Yergason's tests normal. No labral pathology noted with negative Obrien's, negative clunk and good stability. Normal scapular function  observed. No painful arc and no drop arm sign. No apprehension sign  MSK US performed of: Right This study was ordered, performed, and interpreted by Charlann Boxer D.O.  Shoulder:   Supraspinatus:  Intersubstance tearing noted but no true retraction Bursal bulge seen with shoulder abduction on impingement view. Infraspinatus:  Appears normal on long and transverse views. Significant increase in Doppler flow Subscapularis:  Appears normal on long and transverse views. Positive bursa. AC joint:  Capsule undistended, no geyser sign. Glenohumeral Joint:  Appears normal without effusion. 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  This included exercises focusing on stretching, strengthening, with significant focus on eccentric aspects.  Shoulder Exercises that included:  Basic scapular stabilization to include adduction and depression of scapula Scaption, focusing on proper movement and good control Internal and External rotation utilizing a theraband, with elbow tucked at side entire time Rows with theraband   Proper technique shown and discussed handout in great detail with ATC.  All questions were discussed and answered.      Impression and Recommendations:     This case required medical decision making of moderate complexity.      Note: This dictation was prepared with Dragon dictation along with smaller phrase technology. Any transcriptional errors that result from this process are unintentional.

## 2016-02-16 ENCOUNTER — Ambulatory Visit (INDEPENDENT_AMBULATORY_CARE_PROVIDER_SITE_OTHER): Payer: Medicare PPO | Admitting: Family Medicine

## 2016-02-16 ENCOUNTER — Ambulatory Visit: Payer: Self-pay

## 2016-02-16 ENCOUNTER — Encounter: Payer: Self-pay | Admitting: Family Medicine

## 2016-02-16 VITALS — BP 140/90 | HR 62 | Ht 63.5 in | Wt 146.0 lb

## 2016-02-16 DIAGNOSIS — M75111 Incomplete rotator cuff tear or rupture of right shoulder, not specified as traumatic: Secondary | ICD-10-CM

## 2016-02-16 DIAGNOSIS — M25519 Pain in unspecified shoulder: Secondary | ICD-10-CM

## 2016-02-16 DIAGNOSIS — M75101 Unspecified rotator cuff tear or rupture of right shoulder, not specified as traumatic: Secondary | ICD-10-CM | POA: Insufficient documentation

## 2016-02-16 NOTE — Assessment & Plan Note (Signed)
Asian has what appears to be an intersubstance tear but no true retraction. Seems to be incomplete. Patient does have some strength but hopefully patient will respond well to conservative therapy. We discussed icing regimen, home exercises. Patient work with Product/process development scientist. Topical anti-inflammatories given. Discussed which activities to avoid. Patient will come back and see me in 4 weeks.

## 2016-02-16 NOTE — Patient Instructions (Signed)
Good to see you.  Ice 20 minutes 2 times daily. Usually after activity and before bed. pennsaid pinkie amount topically 2 times daily as needed.  Exercises 3 times a week.  No lifting heavy with that arm  2000 IU daily of vitamin D Try to keep hands within peripheral vision.  See me again in 3 weeks to make sure you are doing well and no injection is needed Have a great trip!

## 2016-03-06 NOTE — Progress Notes (Signed)
[name] [address] [place], [place] [address] Phone: [phone] Subjective:    I'm seeing this patient by the request  of:  Hoyt Koch, MD   CC: right shoulder pain f/u   [id number]  Brittany Harper is a 71 y.o. female coming in with complaint of right shoulder pain.  Patient discussed the pain as more of a dull, throbbing aching pain.Patient did have a traumatic fall and in counseling intersubstance tear of the rotator cuff. Patient states that she has made some improvement. States that she is 60% better. Pain is not as severe. Still though seems to be waking her up at night. Patient denies any numbness down the fingertips. Patient states though that she has been doing the exercises with some moderate improvement.    Past Medical History:  Diagnosis Date  . Allergy   . Arthritis   . Asthma   . Cancer ([place])    basal cell  . GERD (gastroesophageal reflux disease)   . Hyperlipidemia    Past Surgical History:  Procedure Laterality Date  . ABDOMINAL HYSTERECTOMY     Social History   Social History  . Marital status: Widowed    Spouse name: N/A  . Number of children: N/A  . Years of education: N/A   Social History Main Topics  . Smoking status: Never Smoker  . Smokeless tobacco: Never Used  . Alcohol use No  . Drug use: No  . Sexual activity: Not Asked   Other Topics Concern  . None   Social History Narrative  . None   Allergies  Allergen Reactions  . Augmentin [Amoxicillin-Pot Clavulanate]   . Demerol [Meperidine]   . Ivp Dye [Iodinated Diagnostic Agents]   . Morphine And Related    Family History  Problem Relation Age of Onset  . Arthritis Mother   . Cancer Mother     breast and ovarian, and basal cell  . Arthritis Father   . Cancer Father     basal cell  . Diabetes Sister   . Diabetes Paternal Uncle   . Diabetes Paternal Grandmother   . Diabetes Paternal Grandfather     Past medical history, social,  surgical and family history all reviewed in electronic medical record.  No pertanent information unless stated regarding to the chief complaint.   Review of Systems: No headache, visual changes, nausea, vomiting, diarrhea, constipation, dizziness, abdominal pain, skin rash, fevers, chills, night sweats, weight loss, swollen lymph nodes, body aches, joint swelling, muscle aches, chest pain, shortness of breath, mood changes.    Objective  Blood pressure 128/84, pulse 60, height 5' 3.5" (1.613 m), weight 143 lb (64.9 kg), SpO2 98 %.   Systems examined below as of 03/08/16 General: NAD A&O x3 mood, affect normal  HEENT: Pupils equal, extraocular movements intact no nystagmus Respiratory: not short of breath at rest or with speaking Cardiovascular: No lower extremity edema, non tender Skin: Warm dry intact with no signs of infection or rash on extremities or on axial skeleton. Abdomen: Soft nontender, no masses Neuro: Cranial nerves  intact, neurovascularly intact in all extremities with 2+ DTRs and 2+ pulses. Lymph: No lymphadenopathy appreciated today  Gait normal with good balance and coordination.  MSK: Non tender with full range of motion and good stability and symmetric strength and tone of  elbows, wrist,  knee hips and ankles bilaterally.   Shoulder: Right Inspection reveals no abnormalities, atrophy or asymmetry. Palpation is normal with no tenderness  over [facility] joint or bicipital groove. ROM is full in all planes passively. Rotator cuff strength normal throughout. Mild impingement sign still remaining. Possibly positive empty can Speeds and Yergason's tests normal. No labral pathology noted with negative Obrien's, negative clunk and good stability. Normal scapular function observed. No painful arc and no drop arm sign. No apprehension sign Hunter lateral shoulder unremarkable.  MSK US performed of: Right This study was ordered, performed, and interpreted by Charlann Boxer  D.O.  Shoulder:   Supraspinatus: Intersubstance tearing still noted. No significant retraction noted. Patient does have what appears to be a cortical defect now seen that there is not as much hypoechoic changes. Infraspinatus:  Appears normal on long and transverse views. Subscapularis:  Appears normal on long and transverse views. Teres Minor:  Appears normal on long and transverse views. AC joint:  Capsule undistended, no geyser sign. Glenohumeral Joint:  Trace arthritis and trace effusion Glenoid Labrum:  Intact without visualized tears. Biceps Tendon:  Appears normal on long and transverse views, no fraying of tendon, tendon located in intertubercular groove, no subluxation with shoulder internal or external rotation. No increased power doppler signal. 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      Impression and Recommendations:     This case required medical decision making of moderate complexity.      Note: This dictation was prepared with Dragon dictation along with smaller phrase technology. Any transcriptional errors that result from this process are unintentional.

## 2016-03-08 ENCOUNTER — Ambulatory Visit: Payer: Self-pay

## 2016-03-08 ENCOUNTER — Encounter: Payer: Self-pay | Admitting: Family Medicine

## 2016-03-08 ENCOUNTER — Ambulatory Visit (INDEPENDENT_AMBULATORY_CARE_PROVIDER_SITE_OTHER): Payer: Medicare PPO | Admitting: Family Medicine

## 2016-03-08 VITALS — BP 128/84 | HR 60 | Ht 63.5 in | Wt 143.0 lb

## 2016-03-08 DIAGNOSIS — M75111 Incomplete rotator cuff tear or rupture of right shoulder, not specified as traumatic: Secondary | ICD-10-CM

## 2016-03-08 MED ORDER — VITAMIN D (ERGOCALCIFEROL) 1.25 MG (50000 UNIT) PO CAPS: 50000.0000 [IU] | ORAL_CAPSULE | ORAL | 0 refills | Status: DC

## 2016-03-08 MED ORDER — NITROGLYCERIN 0.2 MG/HR TD PT24: MEDICATED_PATCH | TRANSDERMAL | 1 refills | Status: DC

## 2016-03-08 NOTE — Patient Instructions (Signed)
Great to see you  Brittany Harper is your friend.  Keep doing what you are doing.  Once weekly vitamin D Nitroglycerin Protocol   Apply 1/4 nitroglycerin patch to affected area daily.  Change position of patch within the affected area every 24 hours.  You may experience a headache during the first 1-2 weeks of using the patch, these should subside.  If you experience headaches after beginning nitroglycerin patch treatment, you may take your preferred over the counter pain reliever.  Another side effect of the nitroglycerin patch is skin irritation or rash related to patch adhesive.  Please notify our office if you develop more severe headaches or rash, and stop the patch.  Tendon healing with nitroglycerin patch may require 12 to 24 weeks depending on the extent of injury.  Men should not use if taking Viagra, Cialis, or Levitra.   Do not use if you have migraines or rosacea.  Start PT 2 times a week at [facility].  See em again in 3-4 weeks.

## 2016-03-08 NOTE — Assessment & Plan Note (Signed)
Patient does have a rotator cuff tear but has good strength and has made some improvement. Will be sent to formal physical therapy. We also discussed different treatment options and patient elected to try the nitroglycerin. Patient was started on this and warned tremendously in multiple times of different potential side effects. We discussed icing regimen, home exercises, discussed which activities to avoid. Follow-up again in 4 weeks. Worsening symptoms I do believe that an MRI would be necessary.  Spent  25 minutes with patient face-to-face and had greater than 50% of counseling including as described above in assessment and plan.

## 2016-03-18 DIAGNOSIS — W101XXS Fall (on)(from) sidewalk curb, sequela: Secondary | ICD-10-CM | POA: Diagnosis not present

## 2016-03-18 DIAGNOSIS — M75111 Incomplete rotator cuff tear or rupture of right shoulder, not specified as traumatic: Secondary | ICD-10-CM | POA: Diagnosis not present

## 2016-03-18 DIAGNOSIS — M62511 Muscle wasting and atrophy, not elsewhere classified, right shoulder: Secondary | ICD-10-CM | POA: Diagnosis not present

## 2016-03-18 DIAGNOSIS — M25511 Pain in right shoulder: Secondary | ICD-10-CM | POA: Diagnosis not present

## 2016-03-22 DIAGNOSIS — M75111 Incomplete rotator cuff tear or rupture of right shoulder, not specified as traumatic: Secondary | ICD-10-CM | POA: Diagnosis not present

## 2016-03-22 DIAGNOSIS — W101XXS Fall (on)(from) sidewalk curb, sequela: Secondary | ICD-10-CM | POA: Diagnosis not present

## 2016-03-22 DIAGNOSIS — M25511 Pain in right shoulder: Secondary | ICD-10-CM | POA: Diagnosis not present

## 2016-03-22 DIAGNOSIS — M62511 Muscle wasting and atrophy, not elsewhere classified, right shoulder: Secondary | ICD-10-CM | POA: Diagnosis not present

## 2016-03-24 DIAGNOSIS — M25511 Pain in right shoulder: Secondary | ICD-10-CM | POA: Diagnosis not present

## 2016-03-24 DIAGNOSIS — W101XXS Fall (on)(from) sidewalk curb, sequela: Secondary | ICD-10-CM | POA: Diagnosis not present

## 2016-03-24 DIAGNOSIS — M62511 Muscle wasting and atrophy, not elsewhere classified, right shoulder: Secondary | ICD-10-CM | POA: Diagnosis not present

## 2016-03-24 DIAGNOSIS — M75111 Incomplete rotator cuff tear or rupture of right shoulder, not specified as traumatic: Secondary | ICD-10-CM | POA: Diagnosis not present

## 2016-03-26 DIAGNOSIS — M25511 Pain in right shoulder: Secondary | ICD-10-CM | POA: Diagnosis not present

## 2016-03-26 DIAGNOSIS — M75111 Incomplete rotator cuff tear or rupture of right shoulder, not specified as traumatic: Secondary | ICD-10-CM | POA: Diagnosis not present

## 2016-03-26 DIAGNOSIS — M62511 Muscle wasting and atrophy, not elsewhere classified, right shoulder: Secondary | ICD-10-CM | POA: Diagnosis not present

## 2016-03-26 DIAGNOSIS — W101XXS Fall (on)(from) sidewalk curb, sequela: Secondary | ICD-10-CM | POA: Diagnosis not present

## 2016-03-29 DIAGNOSIS — M62511 Muscle wasting and atrophy, not elsewhere classified, right shoulder: Secondary | ICD-10-CM | POA: Diagnosis not present

## 2016-03-29 DIAGNOSIS — M75111 Incomplete rotator cuff tear or rupture of right shoulder, not specified as traumatic: Secondary | ICD-10-CM | POA: Diagnosis not present

## 2016-03-29 DIAGNOSIS — W101XXS Fall (on)(from) sidewalk curb, sequela: Secondary | ICD-10-CM | POA: Diagnosis not present

## 2016-03-29 DIAGNOSIS — M25511 Pain in right shoulder: Secondary | ICD-10-CM | POA: Diagnosis not present

## 2016-03-31 DIAGNOSIS — W101XXS Fall (on)(from) sidewalk curb, sequela: Secondary | ICD-10-CM | POA: Diagnosis not present

## 2016-03-31 DIAGNOSIS — M25511 Pain in right shoulder: Secondary | ICD-10-CM | POA: Diagnosis not present

## 2016-03-31 DIAGNOSIS — M62511 Muscle wasting and atrophy, not elsewhere classified, right shoulder: Secondary | ICD-10-CM | POA: Diagnosis not present

## 2016-03-31 DIAGNOSIS — M75111 Incomplete rotator cuff tear or rupture of right shoulder, not specified as traumatic: Secondary | ICD-10-CM | POA: Diagnosis not present

## 2016-04-02 DIAGNOSIS — M75111 Incomplete rotator cuff tear or rupture of right shoulder, not specified as traumatic: Secondary | ICD-10-CM | POA: Diagnosis not present

## 2016-04-02 DIAGNOSIS — W101XXS Fall (on)(from) sidewalk curb, sequela: Secondary | ICD-10-CM | POA: Diagnosis not present

## 2016-04-02 DIAGNOSIS — M25511 Pain in right shoulder: Secondary | ICD-10-CM | POA: Diagnosis not present

## 2016-04-02 DIAGNOSIS — M62511 Muscle wasting and atrophy, not elsewhere classified, right shoulder: Secondary | ICD-10-CM | POA: Diagnosis not present

## 2016-04-03 NOTE — Progress Notes (Signed)
[name] [address] [place], [place] [address] Phone: [phone] Subjective:    I'm seeing this patient by the request  of:  Hoyt Koch, MD   CC: right shoulder pain f/u   [id number]  Brittany Harper is a 71 y.o. female coming in with complaint of right shoulder pain.  Patient discussed the pain as more of a dull, throbbing aching pain.Patient did have a traumatic fall and in counseling intersubstance tear of the rotator cuff. Patient at last follow-up was doing 60% better and we did started on nitroglycerin. Patient was to do other conservative therapy measures. Patient states Shoulder is feeling approximately 80-90% better. Still having some mild discomfort with certain range of motion but overall seems to be doing well.  Patient is concerned though because she is also having some memory difficulty. Patient states that this seemed to be since the fall as well. Concern that she may be having some type of bleed in her head. Denies any visual changes, just having trouble finding the right words, denies any true headaches but does have headaches intermittently. Nothing that significantly worse than her baseline though.    Past Medical History:  Diagnosis Date  . Allergy   . Arthritis   . Asthma   . Cancer ([place])    basal cell  . GERD (gastroesophageal reflux disease)   . Hyperlipidemia    Past Surgical History:  Procedure Laterality Date  . ABDOMINAL HYSTERECTOMY     Social History   Social History  . Marital status: Widowed    Spouse name: N/A  . Number of children: N/A  . Years of education: N/A   Social History Main Topics  . Smoking status: Never Smoker  . Smokeless tobacco: Never Used  . Alcohol use No  . Drug use: No  . Sexual activity: Not on file   Other Topics Concern  . Not on file   Social History Narrative  . No narrative on file   Allergies  Allergen Reactions  . Augmentin [Amoxicillin-Pot Clavulanate]     . Demerol [Meperidine]   . Ivp Dye [Iodinated Diagnostic Agents]   . Morphine And Related    Family History  Problem Relation Age of Onset  . Arthritis Mother   . Cancer Mother     breast and ovarian, and basal cell  . Arthritis Father   . Cancer Father     basal cell  . Diabetes Sister   . Diabetes Paternal Uncle   . Diabetes Paternal Grandmother   . Diabetes Paternal Grandfather     Past medical history, social, surgical and family history all reviewed in electronic medical record.  No pertanent information unless stated regarding to the chief complaint.   Review of Systems: No headache, visual changes, nausea, vomiting, diarrhea, constipation, dizziness, abdominal pain, skin rash, fevers, chills, night sweats, weight loss, swollen lymph nodes, body aches, joint swelling, muscle aches, chest pain, shortness of breath, mood changes.     Objective  There were no vitals taken for this visit.   Systems examined below as of 04/05/16 General: NAD A&O x3 mood, affect normal  HEENT: Pupils equal, extraocular movements intact no nystagmus Respiratory: not short of breath at rest or with speaking Cardiovascular: No lower extremity edema, non tender Skin: Warm dry intact with no signs of infection or rash on extremities or on axial skeleton. Abdomen: Soft nontender, no masses Neuro: Cranial nerves  intact, neurovascularly intact in all extremities with 2+  DTRs and 2+ pulses. Lymph: No lymphadenopathy appreciated today  Gait normal with good balance and coordination.  MSK: Non tender with full range of motion and good stability and symmetric strength and tone of  elbows, wrist,  knee hips and ankles bilaterally.  Arthritic changes of multiple joints Shoulder: Right Inspection reveals no abnormalities, atrophy or asymmetry. Palpation is normal with no tenderness over AC joint or bicipital groove. ROM is full in all planes passively. Rotator cuff strength normal throughout. Very mild  impingement still remaining.. No labral pathology noted with negative Obrien's, negative clunk and good stability. Normal scapular function observed. No painful arc and no drop arm sign. No apprehension sign Contralateral shoulder unremarkable        Impression and Recommendations:     This case required medical decision making of moderate complexity.      Note: This dictation was prepared with Dragon dictation along with smaller phrase technology. Any transcriptional errors that result from this process are unintentional.

## 2016-04-05 ENCOUNTER — Encounter: Payer: Self-pay | Admitting: Family Medicine

## 2016-04-05 ENCOUNTER — Ambulatory Visit (INDEPENDENT_AMBULATORY_CARE_PROVIDER_SITE_OTHER): Payer: Medicare PPO | Admitting: Family Medicine

## 2016-04-05 ENCOUNTER — Other Ambulatory Visit (INDEPENDENT_AMBULATORY_CARE_PROVIDER_SITE_OTHER): Payer: Medicare PPO

## 2016-04-05 DIAGNOSIS — M2550 Pain in unspecified joint: Secondary | ICD-10-CM

## 2016-04-05 DIAGNOSIS — R413 Other amnesia: Secondary | ICD-10-CM | POA: Insufficient documentation

## 2016-04-05 DIAGNOSIS — W101XXS Fall (on)(from) sidewalk curb, sequela: Secondary | ICD-10-CM | POA: Diagnosis not present

## 2016-04-05 DIAGNOSIS — M25511 Pain in right shoulder: Secondary | ICD-10-CM

## 2016-04-05 DIAGNOSIS — M62511 Muscle wasting and atrophy, not elsewhere classified, right shoulder: Secondary | ICD-10-CM | POA: Diagnosis not present

## 2016-04-05 DIAGNOSIS — M75111 Incomplete rotator cuff tear or rupture of right shoulder, not specified as traumatic: Secondary | ICD-10-CM | POA: Diagnosis not present

## 2016-04-05 HISTORY — DX: Other amnesia: R41.3

## 2016-04-05 LAB — CBC WITH DIFFERENTIAL/PLATELET
BASOS PCT: 0.8 % (ref 0.0–3.0)
Basophils Absolute: 0 10*3/uL (ref 0.0–0.1)
EOS ABS: 0.2 10*3/uL (ref 0.0–0.7)
EOS PCT: 3.6 % (ref 0.0–5.0)
HCT: 45 % (ref 36.0–46.0)
HEMOGLOBIN: 15.3 g/dL — AB (ref 12.0–15.0)
LYMPHS PCT: 28.4 % (ref 12.0–46.0)
Lymphs Abs: 1.7 10*3/uL (ref 0.7–4.0)
MCHC: 34 g/dL (ref 30.0–36.0)
MCV: 90.5 fl (ref 78.0–100.0)
MONO ABS: 0.5 10*3/uL (ref 0.1–1.0)
MONOS PCT: 8.3 % (ref 3.0–12.0)
NEUTROS ABS: 3.5 10*3/uL (ref 1.4–7.7)
NEUTROS PCT: 58.9 % (ref 43.0–77.0)
Platelets: 262 10*3/uL (ref 150.0–400.0)
RBC: 4.97 Mil/uL (ref 3.87–5.11)
RDW: 12.8 % (ref 11.5–15.5)
WBC: 5.9 10*3/uL (ref 4.0–10.5)

## 2016-04-05 LAB — VITAMIN B12: Vitamin B-12: 637 pg/mL (ref 211–911)

## 2016-04-05 LAB — COMPREHENSIVE METABOLIC PANEL
ALT: 20 U/L (ref 0–35)
AST: 21 U/L (ref 0–37)
Albumin: 4.6 g/dL (ref 3.5–5.2)
Alkaline Phosphatase: 57 U/L (ref 39–117)
BUN: 16 mg/dL (ref 6–23)
CALCIUM: 10.2 mg/dL (ref 8.4–10.5)
CO2: 33 meq/L — AB (ref 19–32)
Chloride: 103 mEq/L (ref 96–112)
Creatinine, Ser: 0.86 mg/dL (ref 0.40–1.20)
GFR: 68.85 mL/min (ref >60.00)
GLUCOSE: 101 mg/dL — AB (ref 70–99)
POTASSIUM: 4.3 meq/L (ref 3.5–5.1)
Sodium: 143 mEq/L (ref 135–145)
Total Bilirubin: 0.4 mg/dL (ref 0.2–1.2)
Total Protein: 7.6 g/dL (ref 6.0–8.3)

## 2016-04-05 LAB — TSH: TSH: 1.36 u[IU]/mL (ref 0.35–4.50)

## 2016-04-05 LAB — CALCIUM, IONIZED: Calcium, Ion: 5.4 mg/dL (ref 4.8–5.6)

## 2016-04-05 LAB — IBC PANEL
Iron: 139 ug/dL (ref 42–145)
SATURATION RATIOS: 36.5 % (ref 20.0–50.0)
TRANSFERRIN: 272 mg/dL (ref 212.0–360.0)

## 2016-04-05 LAB — FOLATE

## 2016-04-05 LAB — SEDIMENTATION RATE: SED RATE: 12 mm/h (ref 0–30)

## 2016-04-05 NOTE — Assessment & Plan Note (Signed)
Improve with conservative therapy. Declined any type of injection. Patient will make adjustments still with ergonomics. Worsening symptoms we'll consider injection as well as formal physical therapy.

## 2016-04-05 NOTE — Progress Notes (Signed)
Pre-visit discussion using our clinic review tool. No additional management support is needed unless otherwise documented below in the visit note.  

## 2016-04-05 NOTE — Assessment & Plan Note (Signed)
Improvement with conservative therapy. We'll continue to monitor.

## 2016-04-05 NOTE — Assessment & Plan Note (Signed)
Patient past quick screening test but is concern for potential memory issues. We will get labs to light any other organic causes. Patient knows if any worsening headache, visual changes, nausea or vomiting that she needs to seek medical attention immediately. Do not feel that advance imaging is warranted at this time but if continuing to have difficult he will consider it as well as potential referral to neurology.

## 2016-04-05 NOTE — Progress Notes (Unsigned)
So far all labs are great.  We will await 2 more.

## 2016-04-05 NOTE — Patient Instructions (Signed)
Good to see you  Ice on the shoulder still  For the nitro patch still daily for 1 week then every other day for 2 weeks then stop  We will get labs today and make sure everything is ok.  See me again in 3-4 weeks and we will make sure the memory is doing better Tart cherry extract any dose at night

## 2016-04-06 LAB — PTH, INTACT AND CALCIUM
CALCIUM: 10.1 mg/dL (ref 8.6–10.4)
PTH: 22 pg/mL (ref 14–64)

## 2016-04-07 DIAGNOSIS — M75111 Incomplete rotator cuff tear or rupture of right shoulder, not specified as traumatic: Secondary | ICD-10-CM | POA: Diagnosis not present

## 2016-04-07 DIAGNOSIS — W101XXS Fall (on)(from) sidewalk curb, sequela: Secondary | ICD-10-CM | POA: Diagnosis not present

## 2016-04-07 DIAGNOSIS — M25511 Pain in right shoulder: Secondary | ICD-10-CM | POA: Diagnosis not present

## 2016-04-07 DIAGNOSIS — M62511 Muscle wasting and atrophy, not elsewhere classified, right shoulder: Secondary | ICD-10-CM | POA: Diagnosis not present

## 2016-04-12 DIAGNOSIS — W101XXS Fall (on)(from) sidewalk curb, sequela: Secondary | ICD-10-CM | POA: Diagnosis not present

## 2016-04-12 DIAGNOSIS — M62511 Muscle wasting and atrophy, not elsewhere classified, right shoulder: Secondary | ICD-10-CM | POA: Diagnosis not present

## 2016-04-12 DIAGNOSIS — M75111 Incomplete rotator cuff tear or rupture of right shoulder, not specified as traumatic: Secondary | ICD-10-CM | POA: Diagnosis not present

## 2016-04-12 DIAGNOSIS — M25511 Pain in right shoulder: Secondary | ICD-10-CM | POA: Diagnosis not present

## 2016-04-15 DIAGNOSIS — M25511 Pain in right shoulder: Secondary | ICD-10-CM | POA: Diagnosis not present

## 2016-04-15 DIAGNOSIS — M75111 Incomplete rotator cuff tear or rupture of right shoulder, not specified as traumatic: Secondary | ICD-10-CM | POA: Diagnosis not present

## 2016-04-15 DIAGNOSIS — W101XXS Fall (on)(from) sidewalk curb, sequela: Secondary | ICD-10-CM | POA: Diagnosis not present

## 2016-04-15 DIAGNOSIS — M62511 Muscle wasting and atrophy, not elsewhere classified, right shoulder: Secondary | ICD-10-CM | POA: Diagnosis not present

## 2016-04-20 ENCOUNTER — Other Ambulatory Visit: Payer: Self-pay | Admitting: Internal Medicine

## 2016-04-22 ENCOUNTER — Telehealth: Payer: Self-pay

## 2016-04-22 DIAGNOSIS — M62511 Muscle wasting and atrophy, not elsewhere classified, right shoulder: Secondary | ICD-10-CM | POA: Diagnosis not present

## 2016-04-22 DIAGNOSIS — M25511 Pain in right shoulder: Secondary | ICD-10-CM | POA: Diagnosis not present

## 2016-04-22 DIAGNOSIS — W101XXS Fall (on)(from) sidewalk curb, sequela: Secondary | ICD-10-CM | POA: Diagnosis not present

## 2016-04-22 DIAGNOSIS — M75111 Incomplete rotator cuff tear or rupture of right shoulder, not specified as traumatic: Secondary | ICD-10-CM | POA: Diagnosis not present

## 2016-04-22 NOTE — Telephone Encounter (Signed)
Trying to start PA for tretinoin, what is the reson for prescribing this? Please advise

## 2016-04-22 NOTE — Telephone Encounter (Signed)
Wrinkles

## 2016-04-22 NOTE — Telephone Encounter (Signed)
PA Started for Tretinoin Key: [id number]

## 2016-04-23 ENCOUNTER — Telehealth: Payer: Self-pay

## 2016-04-23 NOTE — Telephone Encounter (Signed)
PA for Tretinoin denied

## 2016-04-30 DIAGNOSIS — M62511 Muscle wasting and atrophy, not elsewhere classified, right shoulder: Secondary | ICD-10-CM | POA: Diagnosis not present

## 2016-04-30 DIAGNOSIS — W101XXS Fall (on)(from) sidewalk curb, sequela: Secondary | ICD-10-CM | POA: Diagnosis not present

## 2016-04-30 DIAGNOSIS — M75111 Incomplete rotator cuff tear or rupture of right shoulder, not specified as traumatic: Secondary | ICD-10-CM | POA: Diagnosis not present

## 2016-04-30 DIAGNOSIS — M25511 Pain in right shoulder: Secondary | ICD-10-CM | POA: Diagnosis not present

## 2016-05-03 ENCOUNTER — Encounter: Payer: Self-pay | Admitting: Family Medicine

## 2016-05-03 ENCOUNTER — Ambulatory Visit (INDEPENDENT_AMBULATORY_CARE_PROVIDER_SITE_OTHER): Payer: Medicare PPO | Admitting: Family Medicine

## 2016-05-03 DIAGNOSIS — M75111 Incomplete rotator cuff tear or rupture of right shoulder, not specified as traumatic: Secondary | ICD-10-CM

## 2016-05-03 NOTE — Progress Notes (Signed)
[facility] [address] [place], [place] [address] Phone: [phone] Subjective:     CC: right shoulder pain f/u   [id number]  Brittany Harper is a 71 y.o. female coming in with complaint of right shoulder pain.  Patient discussed the pain as more of a dull, throbbing aching pain.Patient was found to have a intersubstance tear of the rotator cuff. Seem to be traumatic. Patient has been doing formal physical therapy and states that she is 95% better. Patient is very happy with the results of far. Patient states that very minimal discomfort from time to time but nothing severe.  Patient is concerned though because she is also having some memory difficulty. Patient did have laboratory workup that was fairly unremarkable. Patient states that from time to time does still have some difficulty with word finding. States that maybe it is getting somewhat better.    Past Medical History:  Diagnosis Date  . Allergy   . Arthritis   . Asthma   . Cancer ([place])    basal cell  . GERD (gastroesophageal reflux disease)   . Hyperlipidemia    Past Surgical History:  Procedure Laterality Date  . ABDOMINAL HYSTERECTOMY     Social History   Social History  . Marital status: Widowed    Spouse name: N/A  . Number of children: N/A  . Years of education: N/A   Social History Main Topics  . Smoking status: Never Smoker  . Smokeless tobacco: Never Used  . Alcohol use No  . Drug use: No  . Sexual activity: Not Asked   Other Topics Concern  . None   Social History Narrative  . None   Allergies  Allergen Reactions  . Augmentin [Amoxicillin-Pot Clavulanate]   . Demerol [Meperidine]   . Ivp Dye [Iodinated Diagnostic Agents]   . Morphine And Related    Family History  Problem Relation Age of Onset  . Arthritis Mother   . Cancer Mother     breast and ovarian, and basal cell  . Arthritis Father   . Cancer Father     basal cell  . Diabetes Sister   .  Diabetes Paternal Uncle   . Diabetes Paternal Grandmother   . Diabetes Paternal Grandfather     Past medical history, social, surgical and family history all reviewed in electronic medical record.  No pertanent information unless stated regarding to the chief complaint.   Review of Systems: No headache, visual changes, nausea, vomiting, diarrhea, constipation, dizziness, abdominal pain, skin rash, fevers, chills, night sweats, weight loss, swollen lymph nodes, body aches, joint swelling, muscle aches, chest pain, shortness of breath, mood changes.  Positive difficulty with memory   Objective  Blood pressure 126/72, pulse 71, resp. rate 16, weight 146 lb 6 oz (66.4 kg), SpO2 97 %.   Systems examined below as of 05/03/16 General: NAD A&O x3 mood, affect normal  HEENT: Pupils equal, extraocular movements intact no nystagmus Respiratory: not short of breath at rest or with speaking Cardiovascular: No lower extremity edema, non tender Skin: Warm dry intact with no signs of infection or rash on extremities or on axial skeleton. Abdomen: Soft nontender, no masses Neuro: Cranial nerves  intact, neurovascularly intact in all extremities with 2+ DTRs and 2+ pulses. Lymph: No lymphadenopathy appreciated today  Gait normal with good balance and coordination.  MSK: Non tender with full range of motion and good stability and symmetric strength and tone of  elbows, wrist,  knee  hips and ankles bilaterally.  Arthritic changes of multiple joints Shoulder:Right Inspection reveals no abnormalities, atrophy or asymmetry. Palpation is normal with no tenderness over AC joint or bicipital groove. ROM is full in all planes. Rotator cuff strength normal throughout. No signs of impingement with negative Neer and Hawkin's tests, empty can sign. Speeds and Yergason's tests normal. No labral pathology noted with negative Obrien's, negative clunk and good stability. Normal scapular function observed. No painful  arc and no drop arm sign. No apprehension sign Contralateral shoulder unremarkable.          Impression and Recommendations:     This case required medical decision making of moderate complexity.      Note: This dictation was prepared with Dragon dictation along with smaller phrase technology. Any transcriptional errors that result from this process are unintentional.

## 2016-05-03 NOTE — Assessment & Plan Note (Signed)
Patient is mostly fully healed at this time. We discussed icing regimen and home exercises. We discussed as long as patient does well follow-up as needed.

## 2016-05-03 NOTE — Patient Instructions (Signed)
Good to see you  You are doing great and give yourself a pat on the back  I would consider choline 500mg  daily for 2 months and see how it goes.  All the other vitamins are safe Continue home exercises on the shoulder 1-2 times a week.  See me again in 6-8 weeks if not perfect or otherwise as needed.

## 2016-05-06 DIAGNOSIS — L821 Other seborrheic keratosis: Secondary | ICD-10-CM | POA: Diagnosis not present

## 2016-05-06 DIAGNOSIS — L728 Other follicular cysts of the skin and subcutaneous tissue: Secondary | ICD-10-CM | POA: Diagnosis not present

## 2016-05-06 DIAGNOSIS — D1800 Hemangioma unspecified site: Secondary | ICD-10-CM | POA: Diagnosis not present

## 2016-05-06 DIAGNOSIS — D225 Melanocytic nevi of trunk: Secondary | ICD-10-CM | POA: Diagnosis not present

## 2016-05-06 DIAGNOSIS — L814 Other melanin hyperpigmentation: Secondary | ICD-10-CM | POA: Diagnosis not present

## 2016-05-07 DIAGNOSIS — W101XXS Fall (on)(from) sidewalk curb, sequela: Secondary | ICD-10-CM | POA: Diagnosis not present

## 2016-05-07 DIAGNOSIS — M62511 Muscle wasting and atrophy, not elsewhere classified, right shoulder: Secondary | ICD-10-CM | POA: Diagnosis not present

## 2016-05-07 DIAGNOSIS — M25511 Pain in right shoulder: Secondary | ICD-10-CM | POA: Diagnosis not present

## 2016-05-07 DIAGNOSIS — M75111 Incomplete rotator cuff tear or rupture of right shoulder, not specified as traumatic: Secondary | ICD-10-CM | POA: Diagnosis not present

## 2016-05-12 DIAGNOSIS — M62511 Muscle wasting and atrophy, not elsewhere classified, right shoulder: Secondary | ICD-10-CM | POA: Diagnosis not present

## 2016-05-12 DIAGNOSIS — W101XXS Fall (on)(from) sidewalk curb, sequela: Secondary | ICD-10-CM | POA: Diagnosis not present

## 2016-05-12 DIAGNOSIS — M25511 Pain in right shoulder: Secondary | ICD-10-CM | POA: Diagnosis not present

## 2016-05-12 DIAGNOSIS — M75111 Incomplete rotator cuff tear or rupture of right shoulder, not specified as traumatic: Secondary | ICD-10-CM | POA: Diagnosis not present

## 2016-06-12 ENCOUNTER — Other Ambulatory Visit: Payer: Self-pay | Admitting: Internal Medicine

## 2016-06-14 ENCOUNTER — Ambulatory Visit: Payer: Medicare PPO | Admitting: Family Medicine

## 2016-06-24 ENCOUNTER — Telehealth: Payer: Self-pay | Admitting: *Deleted

## 2016-06-24 MED ORDER — ESTROGENS, CONJUGATED 0.625 MG/GM VA CREA: 1.0000 | TOPICAL_CREAM | VAGINAL | 1 refills | Status: DC

## 2016-06-24 NOTE — Telephone Encounter (Signed)
New prescription sent to pharmacy 

## 2016-06-24 NOTE — Telephone Encounter (Signed)
Rec'd call from pharmacist stating pt is wanting to change her Estradiol cream to the Premarin Vaginal cream due to cost. MD is out of office pls advise,.../lmb

## 2016-10-10 ENCOUNTER — Other Ambulatory Visit: Payer: Self-pay | Admitting: Family

## 2016-12-09 ENCOUNTER — Other Ambulatory Visit: Payer: Self-pay | Admitting: Internal Medicine

## 2016-12-16 ENCOUNTER — Other Ambulatory Visit: Payer: Self-pay | Admitting: Internal Medicine

## 2016-12-20 ENCOUNTER — Encounter: Payer: Medicare PPO | Admitting: Internal Medicine

## 2016-12-24 ENCOUNTER — Encounter: Payer: Self-pay | Admitting: Internal Medicine

## 2016-12-24 ENCOUNTER — Ambulatory Visit (INDEPENDENT_AMBULATORY_CARE_PROVIDER_SITE_OTHER): Payer: Medicare PPO | Admitting: Internal Medicine

## 2016-12-24 ENCOUNTER — Other Ambulatory Visit (INDEPENDENT_AMBULATORY_CARE_PROVIDER_SITE_OTHER): Payer: Medicare PPO

## 2016-12-24 VITALS — BP 130/80 | HR 55 | Temp 97.9°F | Ht 63.5 in | Wt 147.0 lb

## 2016-12-24 DIAGNOSIS — E785 Hyperlipidemia, unspecified: Secondary | ICD-10-CM | POA: Diagnosis not present

## 2016-12-24 DIAGNOSIS — Z780 Asymptomatic menopausal state: Secondary | ICD-10-CM

## 2016-12-24 DIAGNOSIS — Z0000 Encounter for general adult medical examination without abnormal findings: Secondary | ICD-10-CM | POA: Diagnosis not present

## 2016-12-24 DIAGNOSIS — K219 Gastro-esophageal reflux disease without esophagitis: Secondary | ICD-10-CM

## 2016-12-24 LAB — COMPREHENSIVE METABOLIC PANEL
ALT: 13 U/L (ref 0–35)
AST: 16 U/L (ref 0–37)
Albumin: 4.4 g/dL (ref 3.5–5.2)
Alkaline Phosphatase: 57 U/L (ref 39–117)
BILIRUBIN TOTAL: 0.3 mg/dL (ref 0.2–1.2)
BUN: 19 mg/dL (ref 6–23)
CALCIUM: 9.3 mg/dL (ref 8.4–10.5)
CO2: 31 meq/L (ref 19–32)
Chloride: 104 mEq/L (ref 96–112)
Creatinine, Ser: 0.87 mg/dL (ref 0.40–1.20)
GFR: 67.8 mL/min (ref >60.00)
GLUCOSE: 92 mg/dL (ref 70–99)
POTASSIUM: 3.8 meq/L (ref 3.5–5.1)
Sodium: 142 mEq/L (ref 135–145)
Total Protein: 7.3 g/dL (ref 6.0–8.3)

## 2016-12-24 LAB — CBC
HEMATOCRIT: 44.3 % (ref 36.0–46.0)
HEMOGLOBIN: 14.8 g/dL (ref 12.0–15.0)
MCHC: 33.4 g/dL (ref 30.0–36.0)
MCV: 91.6 fl (ref 78.0–100.0)
PLATELETS: 251 10*3/uL (ref 150.0–400.0)
RBC: 4.84 Mil/uL (ref 3.87–5.11)
RDW: 12.8 % (ref 11.5–15.5)
WBC: 6.8 10*3/uL (ref 4.0–10.5)

## 2016-12-24 LAB — LIPID PANEL
CHOL/HDL RATIO: 3
Cholesterol: 161 mg/dL (ref 0–200)
HDL: 61.8 mg/dL (ref >39.00)
LDL Cholesterol: 81 mg/dL (ref 0–99)
NONHDL: 98.88
TRIGLYCERIDES: 88 mg/dL (ref 0.0–149.0)
VLDL: 17.6 mg/dL (ref 0.0–40.0)

## 2016-12-24 LAB — VITAMIN D 25 HYDROXY (VIT D DEFICIENCY, FRACTURES): VITD: 37.28 ng/mL (ref 30.00–100.00)

## 2016-12-24 MED ORDER — ESTROGENS, CONJUGATED 0.625 MG/GM VA CREA: 1.0000 | TOPICAL_CREAM | VAGINAL | 11 refills | Status: DC

## 2016-12-24 MED ORDER — ZOSTER VAC RECOMB ADJUVANTED 50 MCG/0.5ML IM SUSR: 0.5000 mL | Freq: Once | INTRAMUSCULAR | 1 refills | Status: AC

## 2016-12-24 MED ORDER — SIMVASTATIN 5 MG PO TABS: 5.0000 mg | ORAL_TABLET | Freq: Every day | ORAL | 3 refills | Status: DC

## 2016-12-24 NOTE — Assessment & Plan Note (Signed)
Cologuard done in 2017 and we will get a copy of results. Tetanus and flu up to date. Counseled about shingrix and given a prescription for that today. Counseled about sun safety and mole surveillance. Given 10 year screening recommendations.

## 2016-12-24 NOTE — Progress Notes (Signed)
Abstracted and sent to scan  

## 2016-12-24 NOTE — Patient Instructions (Signed)

## 2016-12-24 NOTE — Progress Notes (Signed)
   Subjective:    Patient ID: Brittany Harper, female    DOB: 01-Aug-1945, 71 y.o.   MRN: [id number]  HPI Here for medicare wellness and physical, no new complaints. Please see A/P for status and treatment of chronic medical problems.   Diet: heart healthy Physical activity: sedentary Depression/mood screen: negative Hearing: intact to whispered voice Visual acuity: grossly normal with lens, performs annual eye exam  ADLs: capable Fall risk: none Home safety: good Cognitive evaluation: intact to orientation, naming, recall and repetition EOL planning: adv directives discussed, in place MOST and DNR done today  I have personally reviewed and have noted 1. [name] medical and social history - reviewed today no changes 2. Their use of alcohol, tobacco or illicit drugs 3. Their current medications and supplements 4. [name] functional ability including ADL's, fall risks, home safety risks and hearing or visual impairment. 5. Diet and physical activities 6. Evidence for depression or mood disorders 7. Care team reviewed and updated (available in snapshot)  Review of Systems  Constitutional: Negative.   HENT: Negative.   Eyes: Negative.   Respiratory: Negative for cough, chest tightness and shortness of breath.   Cardiovascular: Negative for chest pain, palpitations and leg swelling.  Gastrointestinal: Negative for abdominal distention, abdominal pain, constipation, diarrhea, nausea and vomiting.  Musculoskeletal: Negative.   Skin: Negative.   Neurological: Negative.   Psychiatric/Behavioral: Negative.       Objective:   Physical Exam  Constitutional: She is oriented to person, place, and time. She appears well-developed and well-nourished.  HENT:  Head: Normocephalic and atraumatic.  Eyes: EOM are normal.  Neck: Normal range of motion.  Cardiovascular: Normal rate and regular rhythm.  Pulmonary/Chest: Effort normal and breath sounds normal. No respiratory distress.  She has no wheezes. She has no rales.  Abdominal: Soft. Bowel sounds are normal. She exhibits no distension. There is no tenderness. There is no rebound.  Musculoskeletal: She exhibits no edema.  Neurological: She is alert and oriented to person, place, and time. Coordination normal.  Skin: Skin is warm and dry.  Psychiatric: She has a normal mood and affect.   Vitals:   12/24/16 0757  BP: 130/80  Pulse: (!) 55  Temp: 97.9 F (36.6 C)  TempSrc: Oral  SpO2: 99%  Weight: 147 lb (66.7 kg)  Height: 5' 3.5" (1.613 m)      Assessment & Plan:

## 2016-12-24 NOTE — Assessment & Plan Note (Signed)
Refilled premarin today.

## 2016-12-24 NOTE — Assessment & Plan Note (Signed)
Checking lipid panel, simvastatin refilled today. Adjust as needed.

## 2016-12-24 NOTE — Assessment & Plan Note (Signed)
Taking zantac when needed and tries to follow the diet. Doing well overall.

## 2017-01-17 MED FILL — SHINGRIX VIAL KIT: 50 | 1 days supply | Qty: 1 | Fill #0

## 2017-01-18 ENCOUNTER — Telehealth: Payer: Self-pay | Admitting: *Deleted

## 2017-01-18 NOTE — Telephone Encounter (Signed)
error 

## 2017-03-03 DIAGNOSIS — H26493 Other secondary cataract, bilateral: Secondary | ICD-10-CM | POA: Diagnosis not present

## 2017-03-03 DIAGNOSIS — H04123 Dry eye syndrome of bilateral lacrimal glands: Secondary | ICD-10-CM | POA: Diagnosis not present

## 2017-03-03 DIAGNOSIS — H1851 Endothelial corneal dystrophy: Secondary | ICD-10-CM | POA: Diagnosis not present

## 2017-03-03 DIAGNOSIS — H52203 Unspecified astigmatism, bilateral: Secondary | ICD-10-CM | POA: Diagnosis not present

## 2017-03-07 ENCOUNTER — Other Ambulatory Visit: Payer: Self-pay | Admitting: Internal Medicine

## 2017-03-07 DIAGNOSIS — Z1231 Encounter for screening mammogram for malignant neoplasm of breast: Secondary | ICD-10-CM

## 2017-03-10 ENCOUNTER — Ambulatory Visit
Admission: RE | Admit: 2017-03-10 | Discharge: 2017-03-10 | Disposition: A | Discharge status: Home / Self Care | Payer: Medicare PPO | Source: Ambulatory Visit | Attending: Internal Medicine | Admitting: Internal Medicine

## 2017-03-10 DIAGNOSIS — Z1231 Encounter for screening mammogram for malignant neoplasm of breast: Secondary | ICD-10-CM

## 2017-03-21 MED FILL — SHINGRIX VIAL KIT: 50 | 1 days supply | Qty: 1 | Fill #1

## 2017-04-01 ENCOUNTER — Encounter: Payer: Self-pay | Admitting: Internal Medicine

## 2017-04-01 ENCOUNTER — Ambulatory Visit: Payer: Medicare PPO | Admitting: Internal Medicine

## 2017-04-01 DIAGNOSIS — L719 Rosacea, unspecified: Secondary | ICD-10-CM | POA: Insufficient documentation

## 2017-04-01 MED ORDER — METRONIDAZOLE 0.75 % EX GEL: 1.0000 "application " | Freq: Two times a day (BID) | CUTANEOUS | 0 refills | Status: DC

## 2017-04-01 NOTE — Assessment & Plan Note (Signed)
Rx for metronidazole gel to use on the face for likely rosacea.

## 2017-04-01 NOTE — Progress Notes (Signed)
   Subjective:    Patient ID: Brittany Harper, female    DOB: April 16, 1945, 72 y.o.   MRN: [id number]  HPI The patient is a 72 YO female coming in for facial rash. Going on for about 6 weeks. Called her dermatologist and they could not get her in until almost May. It has worsened and she wanted to be seen sooner. Has history of rosacea but stopped using her meds for this (retin a) worried about reaction. She denies change to linens, soaps, detergents. She did have rash start around the time of getting shingrix but does not think it is related. Got the second one recently. Denies fevers or chills. Some red spots which are itchy and then come to a head and then open and heal. Denies anything that makes it better. Avoiding sunlight and alcohol since this started.   Review of Systems  Constitutional: Negative.   Respiratory: Negative for cough, chest tightness and shortness of breath.   Cardiovascular: Negative for chest pain, palpitations and leg swelling.  Gastrointestinal: Negative for abdominal distention, abdominal pain, constipation, diarrhea, nausea and vomiting.  Musculoskeletal: Negative.   Skin: Positive for rash.  Neurological: Negative.       Objective:   Physical Exam  Constitutional: She is oriented to person, place, and time. She appears well-developed and well-nourished.  HENT:  Head: Normocephalic and atraumatic.  Face with various stages of lesion including healed, white head, scab, lesions are pinpoint and scattered on the face with redness, no cellulitis appearing, blanchable.   Eyes: EOM are normal.  Neck: Normal range of motion.  Cardiovascular: Normal rate and regular rhythm.  Pulmonary/Chest: Effort normal and breath sounds normal. No respiratory distress. She has no wheezes. She has no rales.  Abdominal: Soft.  Neurological: She is alert and oriented to person, place, and time. Coordination normal.  Skin: Skin is warm and dry.   Vitals:   04/01/17 1600  BP: 118/82    Pulse: (!) 59  Temp: 98 F (36.7 C)  TempSrc: Oral  SpO2: 96%  Weight: 146 lb (66.2 kg)  Height: 5' 3.5" (1.613 m)      Assessment & Plan:

## 2017-04-01 NOTE — Patient Instructions (Signed)
We have sent in the metronidazole cream to use on the face twice a day for 1-2 weeks.  If this does not help let us know and we can try the pill medicine to help.

## 2017-04-27 DIAGNOSIS — L929 Granulomatous disorder of the skin and subcutaneous tissue, unspecified: Secondary | ICD-10-CM | POA: Diagnosis not present

## 2017-04-27 DIAGNOSIS — L718 Other rosacea: Secondary | ICD-10-CM | POA: Diagnosis not present

## 2017-05-09 DIAGNOSIS — Z85828 Personal history of other malignant neoplasm of skin: Secondary | ICD-10-CM | POA: Diagnosis not present

## 2017-05-09 DIAGNOSIS — D1801 Hemangioma of skin and subcutaneous tissue: Secondary | ICD-10-CM | POA: Diagnosis not present

## 2017-05-09 DIAGNOSIS — L814 Other melanin hyperpigmentation: Secondary | ICD-10-CM | POA: Diagnosis not present

## 2017-05-09 DIAGNOSIS — L821 Other seborrheic keratosis: Secondary | ICD-10-CM | POA: Diagnosis not present

## 2017-06-12 ENCOUNTER — Other Ambulatory Visit: Payer: Self-pay | Admitting: Internal Medicine

## 2017-08-11 ENCOUNTER — Other Ambulatory Visit: Payer: Self-pay | Admitting: Internal Medicine

## 2018-01-06 ENCOUNTER — Other Ambulatory Visit (INDEPENDENT_AMBULATORY_CARE_PROVIDER_SITE_OTHER): Payer: Medicare PPO

## 2018-01-06 ENCOUNTER — Encounter: Payer: Self-pay | Admitting: Internal Medicine

## 2018-01-06 ENCOUNTER — Ambulatory Visit (INDEPENDENT_AMBULATORY_CARE_PROVIDER_SITE_OTHER): Payer: Medicare PPO | Admitting: Internal Medicine

## 2018-01-06 VITALS — BP 118/82 | HR 66 | Temp 97.9°F | Ht 63.5 in | Wt 143.0 lb

## 2018-01-06 DIAGNOSIS — K449 Diaphragmatic hernia without obstruction or gangrene: Secondary | ICD-10-CM

## 2018-01-06 DIAGNOSIS — Z0000 Encounter for general adult medical examination without abnormal findings: Secondary | ICD-10-CM

## 2018-01-06 DIAGNOSIS — K219 Gastro-esophageal reflux disease without esophagitis: Secondary | ICD-10-CM | POA: Diagnosis not present

## 2018-01-06 DIAGNOSIS — Z780 Asymptomatic menopausal state: Secondary | ICD-10-CM

## 2018-01-06 DIAGNOSIS — E785 Hyperlipidemia, unspecified: Secondary | ICD-10-CM | POA: Diagnosis not present

## 2018-01-06 LAB — CBC
HCT: 44.5 % (ref 36.0–46.0)
Hemoglobin: 15.1 g/dL — ABNORMAL HIGH (ref 12.0–15.0)
MCHC: 34 g/dL (ref 30.0–36.0)
MCV: 89.6 fl (ref 78.0–100.0)
PLATELETS: 247 10*3/uL (ref 150.0–400.0)
RBC: 4.96 Mil/uL (ref 3.87–5.11)
RDW: 12.5 % (ref 11.5–15.5)
WBC: 6.4 10*3/uL (ref 4.0–10.5)

## 2018-01-06 LAB — COMPREHENSIVE METABOLIC PANEL
ALBUMIN: 4.4 g/dL (ref 3.5–5.2)
ALK PHOS: 61 U/L (ref 39–117)
ALT: 14 U/L (ref 0–35)
AST: 15 U/L (ref 0–37)
BUN: 18 mg/dL (ref 6–23)
CHLORIDE: 104 meq/L (ref 96–112)
CO2: 28 mEq/L (ref 19–32)
CREATININE: 0.92 mg/dL (ref 0.40–1.20)
Calcium: 9.5 mg/dL (ref 8.4–10.5)
GFR: 63.39 mL/min (ref >60.00)
Glucose, Bld: 97 mg/dL (ref 70–99)
Potassium: 3.9 mEq/L (ref 3.5–5.1)
Sodium: 141 mEq/L (ref 135–145)
TOTAL PROTEIN: 7.1 g/dL (ref 6.0–8.3)
Total Bilirubin: 0.5 mg/dL (ref 0.2–1.2)

## 2018-01-06 LAB — LIPID PANEL
CHOLESTEROL: 178 mg/dL (ref 0–200)
HDL: 62.4 mg/dL (ref >39.00)
LDL CALC: 93 mg/dL (ref 0–99)
NonHDL: 115.75
TRIGLYCERIDES: 115 mg/dL (ref 0.0–149.0)
Total CHOL/HDL Ratio: 3
VLDL: 23 mg/dL (ref 0.0–40.0)

## 2018-01-06 NOTE — Assessment & Plan Note (Signed)
Flu shot up to date. Pneumonia complete. Shingrix complete. Tetanus up to date. Colonoscopy needs cologuard but she deferred until next year. Mammogram up to date, pap smear aged out and dexa up to date. Counseled about sun safety and mole surveillance. Counseled about the dangers of distracted driving. Given 10 year screening recommendations.

## 2018-01-06 NOTE — Assessment & Plan Note (Signed)
Takes simvastatin 5 mg daily. Checking lipid panel and adjust as needed.

## 2018-01-06 NOTE — Patient Instructions (Signed)

## 2018-01-06 NOTE — Assessment & Plan Note (Signed)
Premarin cream continued.

## 2018-01-06 NOTE — Assessment & Plan Note (Signed)
Has hiatal hernia in the past and wants to see GI as symptoms are much worse and sister recently had hers fixed and is doing very well.

## 2018-01-06 NOTE — Progress Notes (Signed)
   Subjective:   Patient ID: Brittany Harper, female    DOB: 05/29/1945, 73 y.o.   MRN: [id number]  HPI Here for medicare wellness and physical, no new complaints. Please see A/P for status and treatment of chronic medical problems.   Diet: heart healthy Physical activity: sedentary Depression/mood screen: negative Hearing: intact to whispered voice Visual acuity: grossly normal, performs annual eye exam  ADLs: capable Fall risk: none Home safety: good Cognitive evaluation: intact to orientation, naming, recall and repetition EOL planning: adv directives discussed, MOST updated  I have personally reviewed and have noted 1. [name] medical and social history - reviewed today no changes 2. Their use of alcohol, tobacco or illicit drugs 3. Their current medications and supplements 4. [name] functional ability including ADL's, fall risks, home safety risks and hearing or visual impairment. 5. Diet and physical activities 6. Evidence for depression or mood disorders 7. Care team reviewed and updated (available in snapshot)  Review of Systems  Constitutional: Negative.   HENT: Negative.   Eyes: Negative.   Respiratory: Negative for cough, chest tightness and shortness of breath.   Cardiovascular: Negative for chest pain, palpitations and leg swelling.  Gastrointestinal: Negative for abdominal distention, abdominal pain, constipation, diarrhea, nausea and vomiting.       GERD  Musculoskeletal: Negative.   Skin: Negative.   Neurological: Negative.   Psychiatric/Behavioral: Negative.     Objective:  Physical Exam Constitutional:      Appearance: She is well-developed.  HENT:     Head: Normocephalic and atraumatic.  Neck:     Musculoskeletal: Normal range of motion.  Cardiovascular:     Rate and Rhythm: Normal rate and regular rhythm.  Pulmonary:     Effort: Pulmonary effort is normal. No respiratory distress.     Breath sounds: Normal breath sounds. No wheezing  or rales.  Abdominal:     General: Bowel sounds are normal. There is no distension.     Palpations: Abdomen is soft.     Tenderness: There is no abdominal tenderness. There is no rebound.  Skin:    General: Skin is warm and dry.  Neurological:     Mental Status: She is alert and oriented to person, place, and time.     Coordination: Coordination normal.     Vitals:   01/06/18 0757  BP: 118/82  Pulse: 66  Temp: 97.9 F (36.6 C)  TempSrc: Oral  SpO2: 98%  Weight: 143 lb (64.9 kg)  Height: 5' 3.5" (1.613 m)    Assessment & Plan:

## 2018-01-13 ENCOUNTER — Encounter: Payer: Self-pay | Admitting: Gastroenterology

## 2018-02-20 ENCOUNTER — Other Ambulatory Visit: Payer: Self-pay | Admitting: Internal Medicine

## 2018-02-20 DIAGNOSIS — Z1231 Encounter for screening mammogram for malignant neoplasm of breast: Secondary | ICD-10-CM

## 2018-02-22 NOTE — Progress Notes (Signed)
Brittany Harper:  History: Brittany Harper 02/23/2018  Referring physician: Hoyt Koch, MD  Reason for consult/chief complaint: Gastroesophageal Reflux (last egd in 2007, has been on several PPIs throughout the last 20 years. Has hiatal hernia she is also concerned about. )   Subjective  HPI:  Brittany Harper is a very pleasant 73 year old woman referred by primary care for longstanding GERD.  She has had at least 15 years of symptoms with regurgitation and intermittent chest pain, often after meals.  She had an upper endoscopy in [place] in 2007, and brought me photos from that report.  It appears to show a small hiatal hernia, widely patent Schatzki ring, and otherwise normal exam. She was initially on Nexium, then omeprazole, which she then stopped out of concerns that it may increase the risk of dementia.  She was then on ranitidine for many years until it was taken off the market, and she switched to famotidine.  That like the PPIs, has given fair improvement in symptoms that has waned over time.  She denies dysphagia or odynophagia, nausea, vomiting, early satiety or weight loss.  She will get either epigastric or lower sternal discomfort, sometimes simultaneously, usually after meals but sometimes at night.  Recent routine primary care appointment reviewed.  Colon cancer screening advised, patient deferred until a 3-year interval August of this year.  ROS:  Review of Systems  Constitutional: Negative for appetite change and unexpected weight change.  HENT: Negative for mouth sores and voice change.   Eyes: Negative for pain and redness.  Respiratory: Negative for cough and shortness of breath.   Cardiovascular: Negative for chest pain and palpitations.  Genitourinary: Negative for dysuria and hematuria.  Musculoskeletal: Negative for arthralgias and myalgias.  Skin: Negative for pallor and rash.  Neurological: Negative for weakness and headaches.    Hematological: Negative for adenopathy.   She denies change in bowel habits or rectal bleeding.  Past Medical History: Past Medical History:  Diagnosis Date  . Allergy   . Arthritis   . Asthma   . Cancer ([place])    basal cell  . GERD (gastroesophageal reflux disease)   . Hyperlipidemia      Past Surgical History: Past Surgical History:  Procedure Laterality Date  . ABDOMINAL HYSTERECTOMY       Family History: Family History  Problem Relation Age of Onset  . Arthritis Mother   . Breast cancer Mother        breast and ovarian, and basal cell  . Ovarian cancer Mother   . Basal cell carcinoma Mother   . Arthritis Father   . Basal cell carcinoma Father        basal cell  . Diabetes Sister   . Diabetes Paternal Uncle   . Diabetes Paternal Grandmother   . Diabetes Paternal Grandfather   . Colon cancer Neg Hx   . Stomach cancer Neg Hx   . Pancreatic cancer Neg Hx     Social History: Social History   Socioeconomic History  . Marital status: Widowed    Spouse name: Not on file  . Number of children: 4  . Years of education: Not on file  . Highest education level: Not on file  Occupational History  . Occupation: retired Management consultant  . Financial resource strain: Not on file  . Food insecurity:    Worry: Not on file    Inability: Not on file  . Transportation needs:    Medical: Not  on file    Non-medical: Not on file  Tobacco Use  . Smoking status: Never Smoker  . Smokeless tobacco: Never Used  Substance and Sexual Activity  . Alcohol use: No    Alcohol/week: 0.0 standard drinks  . Drug use: No  . Sexual activity: Not on file  Lifestyle  . Physical activity:    Days per week: Not on file    Minutes per session: Not on file  . Stress: Not on file  Relationships  . Social connections:    Talks on phone: Not on file    Gets together: Not on file    Attends religious service: Not on file    Active member of club or organization: Not on file     Attends meetings of clubs or organizations: Not on file    Relationship status: Not on file  Other Topics Concern  . Not on file  Social History Narrative  . Not on file   Originally from [place], she and her husband had a summer place in [place], spent 2 years in [place] and then moved to this area.  She lost her husband to brain cancer several years ago. She is a member of [employer].  Allergies: Allergies  Allergen Reactions  . Augmentin [Amoxicillin-Pot Clavulanate]   . Demerol [Meperidine]   . Ivp Dye [Iodinated Diagnostic Agents]   . Morphine And Related     Outpatient Meds: Current Outpatient Medications  Medication Sig Dispense Refill  . Azelaic Acid 15 % cream After skin is thoroughly washed and patted dry, gently but thoroughly massage a thin film of azelaic acid cream into the affected area twice daily, in the morning and evening. 150 g 3  . Calcium Citrate-Vitamin D (CITRACAL + D PO) Take by mouth.    . conjugated estrogens (PREMARIN) vaginal cream Place 1 Applicatorful vaginally once a week. 30 g 11  . famotidine (PEPCID) 10 MG tablet Take 10 mg by mouth 2 (two) times daily.    . fexofenadine (ALLEGRA) 180 MG tablet Take 180 mg by mouth daily.    . fluticasone (FLONASE) 50 MCG/ACT nasal spray Place into both nostrils daily.    . metroNIDAZOLE (METROGEL) 0.75 % gel Apply 1 application topically 2 (two) times daily. 45 g 0  . Misc Natural Products (OSTEO BI-FLEX ADV JOINT SHIELD PO) Take by mouth.    . Multiple Vitamins-Minerals (CENTRUM SILVER ULTRA WOMENS PO) Take by mouth.    Marland Kitchen PREMARIN vaginal cream PLACE 1 APPLICATORFUL VAGINALLY ONCE A WEEK. 30 g 1  . simvastatin (ZOCOR) 5 MG tablet TAKE 1 TABLET BY MOUTH EVERY DAY 90 tablet 3   No current facility-administered medications for this visit.       ___________________________________________________________________ Objective   Exam:  BP 122/70   Pulse 60   Ht 5'  3.5" (1.613 m)   Wt 142 lb (64.4 kg)   BMI 24.76 kg/m    General: Well-appearing, normal vocal quality  Eyes: sclera anicteric, no redness  ENT: oral mucosa moist without lesions, no cervical or supraclavicular lymphadenopathy  CV: RRR without murmur, S1/S2, no JVD, no peripheral edema  Resp: clear to auscultation bilaterally, normal RR and effort noted  GI: soft, no tenderness, with active bowel sounds. No guarding or palpable organomegaly noted.  Skin; warm and dry, no rash or jaundice noted  Neuro: awake, alert and oriented x 3. Normal gross motor function and fluent speech  Labs:  Negative Cologuard August 2017.  Assessment: Encounter Diagnoses  Name Primary?  . Gastroesophageal reflux disease without esophagitis Yes  . Hiatal hernia   . Schatzki's ring     Many years of reflux symptoms, though it is not entirely clear if the upper abdominal pain is also from that.  Previously small hiatal hernia, unknown if changed in the interval.  Known widely patent Schatzki ring, no dysphagia at present. She feels that she has engaged in usual diet and lifestyle measures, but has persistent symptoms despite that and acid suppression medicine.  Plan:  Upper endoscopy.  She is agreeable after discussion of procedure and risks.  The benefits and risks of the planned procedure were described in detail with the patient or (when appropriate) their health care proxy.  Risks were outlined as including, but not limited to, bleeding, infection, perforation, adverse medication reaction leading to cardiac or pulmonary decompensation, or pancreatitis (if ERCP).  The limitation of incomplete mucosal visualization was also discussed.  No guarantees or warranties were given.  She was given possible dates because she needs to arrange a ride, and plans to contact us soon about that.  Continue current management in the meantime.  I also reassured her that initial studies suggesting a correlation  between PPI and dementia have been disproven.  We may consider that seems she needs increased acid suppression therapy depending on endoscopic findings.  Thank you for the courtesy of this consult.  Please call me with any questions or concerns.  Nelida Meuse III  CC: Referring provider noted above

## 2018-02-23 ENCOUNTER — Encounter: Payer: Self-pay | Admitting: Gastroenterology

## 2018-02-23 ENCOUNTER — Ambulatory Visit: Payer: Medicare PPO | Admitting: Gastroenterology

## 2018-02-23 VITALS — BP 122/70 | HR 60 | Ht 63.5 in | Wt 142.0 lb

## 2018-02-23 DIAGNOSIS — K222 Esophageal obstruction: Secondary | ICD-10-CM

## 2018-02-23 DIAGNOSIS — K219 Gastro-esophageal reflux disease without esophagitis: Secondary | ICD-10-CM | POA: Diagnosis not present

## 2018-02-23 DIAGNOSIS — K449 Diaphragmatic hernia without obstruction or gangrene: Secondary | ICD-10-CM | POA: Diagnosis not present

## 2018-02-23 NOTE — Patient Instructions (Signed)
If you are age 75 or older, your body mass index should be between 23-30. Your Body mass index is 24.76 kg/m. If this is out of the aforementioned range listed, please consider follow up with your Primary Care Provider.  If you are age 42 or younger, your body mass index should be between 19-25. Your Body mass index is 24.76 kg/m. If this is out of the aformentioned range listed, please consider follow up with your Primary Care Provider.   You have been scheduled for an endoscopy. Please follow written instructions given to you at your visit today. If you use inhalers (even only as needed), please bring them with you on the day of your procedure. Your physician has requested that you go to www.startemmi.com and enter the access code given to you at your visit today. This web site gives a general overview about your procedure. However, you should still follow specific instructions given to you by our office regarding your preparation for the procedure.  It was a pleasure to see you today!  Dr. Loletha Carrow

## 2018-03-06 DIAGNOSIS — H26493 Other secondary cataract, bilateral: Secondary | ICD-10-CM | POA: Diagnosis not present

## 2018-03-06 DIAGNOSIS — H04123 Dry eye syndrome of bilateral lacrimal glands: Secondary | ICD-10-CM | POA: Diagnosis not present

## 2018-03-06 DIAGNOSIS — H52203 Unspecified astigmatism, bilateral: Secondary | ICD-10-CM | POA: Diagnosis not present

## 2018-03-06 DIAGNOSIS — H1851 Endothelial corneal dystrophy: Secondary | ICD-10-CM | POA: Diagnosis not present

## 2018-03-22 ENCOUNTER — Ambulatory Visit: Payer: Medicare PPO

## 2018-03-30 ENCOUNTER — Encounter: Payer: Medicare PPO | Admitting: Gastroenterology

## 2018-04-20 ENCOUNTER — Ambulatory Visit: Payer: Medicare PPO

## 2018-05-01 ENCOUNTER — Encounter: Payer: Medicare PPO | Admitting: Gastroenterology

## 2018-05-09 ENCOUNTER — Telehealth: Payer: Self-pay | Admitting: *Deleted

## 2018-05-09 NOTE — Telephone Encounter (Signed)
Called Brittany Harper To RS her EGD with Dr Loletha Carrow for GERD_ she states she wishes to wait until June to RS- I informed her the schedule for June will be out later this week or early next and I will CB then to RS her- she verbalized understanding   Lelan Pons pV

## 2018-05-15 NOTE — Telephone Encounter (Signed)
RS EGD for GERD with pt today for  WED 06-07-2018 at 10 am - verified address to mail new instructions to pt- pt aware here 9 am for 10 am EGD_ Lelan Pons PV

## 2018-06-05 ENCOUNTER — Other Ambulatory Visit: Payer: Self-pay

## 2018-06-05 ENCOUNTER — Ambulatory Visit
Admission: RE | Admit: 2018-06-05 | Discharge: 2018-06-05 | Disposition: A | Discharge status: Home / Self Care | Payer: Medicare PPO | Source: Ambulatory Visit | Attending: Internal Medicine | Admitting: Internal Medicine

## 2018-06-05 DIAGNOSIS — Z1231 Encounter for screening mammogram for malignant neoplasm of breast: Secondary | ICD-10-CM | POA: Diagnosis not present

## 2018-06-07 ENCOUNTER — Encounter: Payer: Medicare PPO | Admitting: Gastroenterology

## 2018-06-23 ENCOUNTER — Telehealth: Payer: Self-pay | Admitting: Gastroenterology

## 2018-06-23 NOTE — Telephone Encounter (Signed)

## 2018-06-26 ENCOUNTER — Ambulatory Visit (AMBULATORY_SURGERY_CENTER): Payer: Medicare PPO | Admitting: Gastroenterology

## 2018-06-26 ENCOUNTER — Other Ambulatory Visit: Payer: Self-pay

## 2018-06-26 ENCOUNTER — Encounter: Payer: Self-pay | Admitting: Gastroenterology

## 2018-06-26 VITALS — BP 149/75 | HR 66 | Temp 98.3°F | Resp 16 | Ht 63.5 in | Wt 142.0 lb

## 2018-06-26 DIAGNOSIS — K2100 Gastro-esophageal reflux disease with esophagitis, without bleeding: Secondary | ICD-10-CM

## 2018-06-26 DIAGNOSIS — K222 Esophageal obstruction: Secondary | ICD-10-CM | POA: Diagnosis not present

## 2018-06-26 DIAGNOSIS — J45909 Unspecified asthma, uncomplicated: Secondary | ICD-10-CM | POA: Diagnosis not present

## 2018-06-26 DIAGNOSIS — K449 Diaphragmatic hernia without obstruction or gangrene: Secondary | ICD-10-CM

## 2018-06-26 DIAGNOSIS — K317 Polyp of stomach and duodenum: Secondary | ICD-10-CM

## 2018-06-26 DIAGNOSIS — K219 Gastro-esophageal reflux disease without esophagitis: Secondary | ICD-10-CM | POA: Diagnosis not present

## 2018-06-26 DIAGNOSIS — E7800 Pure hypercholesterolemia, unspecified: Secondary | ICD-10-CM | POA: Diagnosis not present

## 2018-06-26 MED ORDER — SODIUM CHLORIDE 0.9 % IV SOLN: 500.0000 mL | Freq: Once | INTRAVENOUS | Status: DC

## 2018-06-26 NOTE — Progress Notes (Signed)
Report given to PACU, vss 

## 2018-06-26 NOTE — Op Note (Signed)
[facility] Patient Name: Brittany Harper Procedure Date: 06/26/2018 9:12 AM MRN: [id number] Endoscopist: Mallie Mussel L. Loletha Carrow , MD Age: 73 Referring MD:  Date of Birth: April 18, 1945 Gender: Female Account #: [id number] Procedure:                Upper GI endoscopy Indications:              Esophageal reflux symptoms that persist despite                            appropriate therapy (patient on H2RA, had concerns                            about possible long-term PPI side effects) Medicines:                Monitored Anesthesia Care Procedure:                Pre-Anesthesia Assessment:                           - Prior to the procedure, a History and Physical                            was performed, and patient medications and                            allergies were reviewed. [name] tolerance of                            previous anesthesia was also reviewed. The risks                            and benefits of the procedure and the sedation                            options and risks were discussed with the patient.                            All questions were answered, and informed consent                            was obtained. Prior Anticoagulants: The patient has                            taken no previous anticoagulant or antiplatelet                            agents. ASA Grade Assessment: II - A patient with                            mild systemic disease. After reviewing the risks                            and benefits, the patient was deemed in  satisfactory condition to undergo the procedure.                           After obtaining informed consent, the endoscope was                            passed under direct vision. Throughout the                            procedure, the patient's blood pressure, pulse, and                            oxygen saturations were monitored continuously. The                            Endoscope was  introduced through the mouth, and                            advanced to the second part of duodenum. The upper                            GI endoscopy was accomplished without difficulty.                            The patient tolerated the procedure well. Scope In: Scope Out: Findings:                 Savary-Miller Grade II (multiple lesions and folds,                            noncircumferential, with or without confluence)                            esophagitis with no bleeding was found at the                            gastroesophageal junction. Small, clean-based                            reflux ulcers.                           A widely patent Schatzki ring was found at the                            gastroesophageal junction.                           A 2-3 cm hiatal hernia was present.                           The exam of the esophagus was otherwise normal.                           Multiple diminutive sessile fundic gland polyps  were found in the gastric body.                           The exam of the stomach was otherwise normal,                            including on retroflexion.                           The examined duodenum was normal. Complications:            No immediate complications. Estimated Blood Loss:     Estimated blood loss: none. Impression:               Common and benign finding from acid-suppression                            medicine.                           - Savary-Miller Grade II reflux esophagitis.                           - Widely patent Schatzki ring.                           - 2 cm hiatal hernia.                           - Multiple fundic gland polyps.                           - Normal examined duodenum.                           - No specimens collected. Recommendation:           - Patient has a contact number available for                            emergencies. The signs and symptoms of potential                             delayed complications were discussed with the                            patient. Return to normal activities tomorrow.                            Written discharge instructions were provided to the                            patient.                           - Resume previous diet.                           -  Continue present medications.                           Patient advised to consider PPI. Anti-reflux                            therapy such as TIF or fundoplication could also be                            considered. Henry L. Loletha Carrow, MD 06/26/2018 9:48:45 AM This report has been signed electronically.

## 2018-06-26 NOTE — Progress Notes (Signed)
Pt's states no medical or surgical changes since previsit or office visit.  Temp-cw  Vital signs-jb 

## 2018-06-26 NOTE — Patient Instructions (Signed)
Handouts given for esophagitis and hiatal hernia.  YOU HAD AN ENDOSCOPIC PROCEDURE TODAY AT [facility]:   Refer to the procedure report that was given to you for any specific questions about what was found during the examination.  If the procedure report does not answer your questions, please call your gastroenterologist to clarify.  If you requested that your care partner not be given the details of your procedure findings, then the procedure report has been included in a sealed envelope for you to review at your convenience later.  YOU SHOULD EXPECT: Some feelings of bloating in the abdomen. Passage of more gas than usual.  Walking can help get rid of the air that was put into your GI tract during the procedure and reduce the bloating. If you had a lower endoscopy (such as a colonoscopy or flexible sigmoidoscopy) you may notice spotting of blood in your stool or on the toilet paper. If you underwent a bowel prep for your procedure, you may not have a normal bowel movement for a few days.  Please Note:  You might notice some irritation and congestion in your nose or some drainage.  This is from the oxygen used during your procedure.  There is no need for concern and it should clear up in a day or so.  SYMPTOMS TO REPORT IMMEDIATELY:   Following upper endoscopy (EGD)  Vomiting of blood or coffee ground material  New chest pain or pain under the shoulder blades  Painful or persistently difficult swallowing  New shortness of breath  Fever of 100F or higher  Black, tarry-looking stools  For urgent or emergent issues, a gastroenterologist can be reached at any hour by calling [phone].   DIET:  We do recommend a small meal at first, but then you may proceed to your regular diet.  Drink plenty of fluids but you should avoid alcoholic beverages for 24 hours.  ACTIVITY:  You should plan to take it easy for the rest of today and you should NOT DRIVE or use heavy machinery  until tomorrow (because of the sedation medicines used during the test).    FOLLOW UP: Our staff will call the number listed on your records 48-72 hours following your procedure to check on you and address any questions or concerns that you may have regarding the information given to you following your procedure. If we do not reach you, we will leave a message.  We will attempt to reach you two times.  During this call, we will ask if you have developed any symptoms of COVID 19. If you develop any symptoms (ie: fever, flu-like symptoms, shortness of breath, cough etc.) before then, please call [phone].  If you test positive for Covid 19 in the 2 weeks post procedure, please call and report this information to [place].    If any biopsies were taken you will be contacted by phone or by letter within the next 1-3 weeks.  Please call us at [phone] if you have not heard about the biopsies in 3 weeks.    SIGNATURES/CONFIDENTIALITY: You and/or your care partner have signed paperwork which will be entered into your electronic medical record.  These signatures attest to the fact that that the information above on your After Visit Summary has been reviewed and is understood.  Full responsibility of the confidentiality of this discharge information lies with you and/or your care-partner.

## 2018-06-28 ENCOUNTER — Telehealth: Payer: Self-pay | Admitting: *Deleted

## 2018-06-28 NOTE — Telephone Encounter (Signed)
  Follow up Call-  Call back number 06/26/2018  Post procedure Call Back phone  # [phone]  Permission to leave phone message Yes  Some recent data might be hidden     Patient questions:  Do you have a fever, pain , or abdominal swelling? No. Pain Score  0 *  Have you tolerated food without any problems? Yes.    Have you been able to return to your normal activities? Yes.    Do you have any questions about your discharge instructions: Diet   No. Medications  No. Follow up visit  No.  Do you have questions or concerns about your Care? No.  1. Actions:Have you developed a fever since your procedure? no  2.   Have you had an respiratory symptoms (SOB or cough) since your procedure? no  3.   Have you tested positive for COVID 19 since your procedure no  4.   Have you had any family members/close contacts diagnosed with the COVID 19 since your procedure?  no   If yes to any of these questions please route to Joylene John, RN and Alphonsa Gin, RN.  * If pain score is 4 or above: No action needed, pain <4.

## 2018-06-28 NOTE — Telephone Encounter (Signed)
Left message for first post procedure call will call back. SM

## 2018-07-31 ENCOUNTER — Encounter

## 2018-07-31 ENCOUNTER — Encounter: Payer: Self-pay | Admitting: Gastroenterology

## 2018-07-31 ENCOUNTER — Ambulatory Visit (INDEPENDENT_AMBULATORY_CARE_PROVIDER_SITE_OTHER): Payer: Medicare PPO | Admitting: Gastroenterology

## 2018-07-31 VITALS — Ht 63.5 in | Wt 142.0 lb

## 2018-07-31 DIAGNOSIS — K449 Diaphragmatic hernia without obstruction or gangrene: Secondary | ICD-10-CM | POA: Diagnosis not present

## 2018-07-31 DIAGNOSIS — K219 Gastro-esophageal reflux disease without esophagitis: Secondary | ICD-10-CM

## 2018-07-31 DIAGNOSIS — K2100 Gastro-esophageal reflux disease with esophagitis, without bleeding: Secondary | ICD-10-CM

## 2018-07-31 NOTE — Addendum Note (Signed)
Addended by: Elias Else on: 07/31/2018 03:18 PM   Modules accepted: Orders

## 2018-07-31 NOTE — Progress Notes (Signed)
This patient contacted our office requesting a physician telemedicine consultation regarding clinical questions and/or test results. Due to COVID restrictions, this was felt to be the most appropriate method of patient evaluation.  Participants on the conference : myself and patient   The patient consented to this consultation and was aware that a charge will be placed through their insurance.  They were also made aware of the limitations of telemedicine.  I was in my office and the patient was at home.   Encounter time:  Total time 22 minutes, with 18 minutes spent with patient on telephone   _____________________________________________________________________________________________   Chronic GERD symptoms.  Patient has been on H2 blocker, reluctant to take PPI due to concern for side effects.  Upper endoscopy recently showed 2 to 3 cm hiatal hernia, widely patent Schatzki ring, fundic gland polyps and reflux esophagitis.  Brittany Harper took Prilosec for about 2 weeks after the upper endoscopy, and had improvement in heartburn, but still persistent regurgitation and epigastric discomfort.  She also reports chronic early satiety and nausea unless she has very small portions.  She has rare dysphagia that is usually relieved after drinking water.  We discussed GERD, and how she has esophagitis and persistent symptoms despite acid suppression therapy.  She has had either insufficient relief from, or intolerance to, PPI therapy.  Her epigastric discomfort and early satiety are nonspecific, but suggest the need to rule out gastroparesis, especially if endoscopic or surgical therapy for reflux is to be considered.  We discussed the possibility of traditional surgical hernia repair and fundoplication or, if she is a good candidate, TIF procedure.  I explained the basics of both of those, and she is interested to learn more.   My office staff will schedule a gastric emptying study.  After those results,  I will speak with my partner Dr. Bryan Lemma about a consultation to consider TIF. Meanwhile, Brittany Harper will stay on her H2 blocker since it seems to be the best tolerated therapy for her.  Diet and lifestyle antireflux measures were reviewed.   Wilfrid Lund, MD    [facility]

## 2018-07-31 NOTE — Patient Instructions (Signed)
If you are age 75 or older, your body mass index should be between 23-30. Your Body mass index is 24.76 kg/m. If this is out of the aforementioned range listed, please consider follow up with your Primary Care Provider.  If you are age 5 or younger, your body mass index should be between 19-25. Your Body mass index is 24.76 kg/m. If this is out of the aformentioned range listed, please consider follow up with your Primary Care Provider.   You have been scheduled for a gastric emptying scan at [facility] on 08-15-2018 at 830am. Please arrive at least 15 minutes prior to your appointment for registration. Please make certain not to have anything to eat or drink after midnight the night before your test. Hold all stomach medications (ex: Zofran, phenergan, Reglan) 48 hours prior to your test. If you need to reschedule your appointment, please contact radiology scheduling at [phone]. _____________________________________________________________________ A gastric-emptying study measures how long it takes for food to move through your stomach. There are several ways to measure stomach emptying. In the most common test, you eat food that contains a small amount of radioactive material. A scanner that detects the movement of the radioactive material is placed over your abdomen to monitor the rate at which food leaves your stomach. This test normally takes about 4 hours to complete. _____________________________________________________________________

## 2018-08-15 ENCOUNTER — Other Ambulatory Visit: Payer: Self-pay

## 2018-08-15 ENCOUNTER — Ambulatory Visit (HOSPITAL_COMMUNITY)
Admission: RE | Admit: 2018-08-15 | Discharge: 2018-08-15 | Disposition: A | Discharge status: Home / Self Care | Payer: Medicare PPO | Source: Ambulatory Visit | Attending: Gastroenterology | Admitting: Gastroenterology

## 2018-08-15 DIAGNOSIS — R110 Nausea: Secondary | ICD-10-CM | POA: Diagnosis not present

## 2018-08-15 DIAGNOSIS — K2100 Gastro-esophageal reflux disease with esophagitis, without bleeding: Secondary | ICD-10-CM

## 2018-08-15 DIAGNOSIS — K449 Diaphragmatic hernia without obstruction or gangrene: Secondary | ICD-10-CM | POA: Diagnosis not present

## 2018-08-15 DIAGNOSIS — R1013 Epigastric pain: Secondary | ICD-10-CM | POA: Diagnosis not present

## 2018-08-15 DIAGNOSIS — R6881 Early satiety: Secondary | ICD-10-CM | POA: Diagnosis not present

## 2018-08-15 MED ORDER — TECHNETIUM TC 99M SULFUR COLLOID
2.1000 | Freq: Once | INTRAVENOUS | Status: AC | PRN
  Administered 2018-08-15: 2.1 via INTRAVENOUS

## 2018-08-16 ENCOUNTER — Telehealth: Payer: Self-pay

## 2018-08-16 NOTE — Telephone Encounter (Signed)
-----   Message from Dalene Seltzer, RN sent at 08/16/2018  1:36 PM EDT ----- Lorenza Cambridge, this is from a result note that I copied and pasted into a staff message. I didn't want to schedule the patient if Dr. Loletha Grayer wanted to speak to Dr. Loletha Carrow first about what's been going on with the patient. I just wanted you to be aware in case she needed to be scheduled with Dr. Loletha Grayer for the TIF procedure.    Doran Stabler, MD  Dalene Seltzer, RN  Cc: Lavena Bullion, DO    Brittani,   Please let her know that the stomach empties normally on this study. So, per our recent discussion, I would like her to have a consultation with Dr. Bryan Lemma to discuss possible TIF procedure.   Vito,  Let me know if you would like to have a dialogue about this patient before she meets with you.   Thanks very much.   - HD

## 2018-08-16 NOTE — Telephone Encounter (Signed)
Please review previous messages and advise on next step for RN to complete for patient care;

## 2018-08-17 NOTE — Telephone Encounter (Signed)
RN has spoken with the patient-patient has been scheduled for an appt to discuss TIF procedure with Dr. Bryan Lemma on 08/31/2018 at 8:50 am; please review previous documentation concerning this patient if needed;

## 2018-08-17 NOTE — Telephone Encounter (Signed)
Please see my separate forwarded note, but yes, okay to schedule an appointment with me to discuss TIF.

## 2018-08-22 LAB — FECAL OCCULT BLOOD, IMMUNOCHEMICAL: IFOBT: NEGATIVE

## 2018-08-31 ENCOUNTER — Encounter: Payer: Self-pay | Admitting: Gastroenterology

## 2018-08-31 ENCOUNTER — Ambulatory Visit (INDEPENDENT_AMBULATORY_CARE_PROVIDER_SITE_OTHER): Payer: Medicare PPO | Admitting: Gastroenterology

## 2018-08-31 ENCOUNTER — Other Ambulatory Visit: Payer: Self-pay

## 2018-08-31 VITALS — BP 138/84 | HR 76 | Temp 98.3°F | Ht 63.75 in | Wt 149.1 lb

## 2018-08-31 DIAGNOSIS — K2100 Gastro-esophageal reflux disease with esophagitis, without bleeding: Secondary | ICD-10-CM

## 2018-08-31 DIAGNOSIS — K449 Diaphragmatic hernia without obstruction or gangrene: Secondary | ICD-10-CM

## 2018-08-31 DIAGNOSIS — R110 Nausea: Secondary | ICD-10-CM

## 2018-08-31 NOTE — Progress Notes (Addendum)
P  Chief Complaint:    GERD with erosive esophagitis, evaluation for Transoral Incisionless Fundoplication (TIF)  Referring physician: Dr. Wilfrid Lund  GI History: Brittany Harper is a 73 year old female referred to me by Dr. Loletha Carrow for evaluation of possible antireflux intervention with Transoral Incisionless Fundoplication (TIF) with a goal to stop or significantly reduce acid suppression therapy.  She has a longstanding history of reflux for 20+ years, characterized by heartburn, regurgitation, epigastric discomfort.  Will now have postprandial heartburn with most meals despite acid suppression therapy.  Additionally with chronic early satiety and nausea.  She has rare episodes of dysphagia which resolve with water.    Has trialed multiple H2 RA and PPI medications in the past, starting with Omeprazole for 12 years, then eventually lost efficacy. Has also trialed ranitidine (no improvement), Tums, Pepcid. Retrialed Prilosec recently with improvement, but again eventual loss of efficacy.  Additionally, she does not want to take medications long-term, especially PPIs.  Recent GES was normal through 3 hours, with mildly delayed emptying at 85% (normal >90%) at 4 hours.  GERD history: -Index symptoms: Heartburn, regurgitation, epigastric discomfort, nausea -Medications trialed: Prilosec, Tums, Pepcid -Current medications: Pepcid 10 mg bid -Complications: Erosive esophagitis -GERD-HRQL score: 33/50  GERD evaluation: -EGD (6/20, Dr. Loletha Carrow): Grade 2 esophagitis, patent Schatzki's ring, 2 to 3 cm HH, fundic gland polyps - Prior EGD in [place] approx 12 years ago also with EE per patient   HPI:    Patient is a 73 y.o. female presenting to the Gastroenterology Clinic to discuss ongoing management of her reflux ad discuss TIF. She has a long standing hx of GERD as outlined above, currently incompletely controlled with Pepcid. She is very interested in TIF as a means to control her reflux and stop or  significantly reduce need for acid suppression therapy.   Review of systems:     No chest pain, no SOB, no fevers, no urinary sx   Past Medical History:  Diagnosis Date   Allergy    Arthritis    Asthma    Cancer ([place])    basal cell   GERD (gastroesophageal reflux disease)    Hyperlipidemia     Patient's surgical history, family medical history, social history, medications and allergies were all reviewed in Epic    Current Outpatient Medications  Medication Sig Dispense Refill   AMBULATORY NON FORMULARY MEDICATION 1 tablet 2 (two) times daily. Viviscal     Azelaic Acid 15 % cream After skin is thoroughly washed and patted dry, gently but thoroughly massage a thin film of azelaic acid cream into the affected area twice daily, in the morning and evening. 150 g 3   Calcium Citrate-Vitamin D (CITRACAL + D PO) Take by mouth.     conjugated estrogens (PREMARIN) vaginal cream Place 1 Applicatorful vaginally once a week. 30 g 11   famotidine (PEPCID) 10 MG tablet Take 10 mg by mouth 2 (two) times daily.     fexofenadine (ALLEGRA) 180 MG tablet Take 180 mg by mouth daily.     metroNIDAZOLE (METROGEL) 0.75 % gel Apply 1 application topically 2 (two) times daily. 45 g 0   Misc Natural Products (OSTEO BI-FLEX ADV JOINT SHIELD PO) Take by mouth.     Multiple Vitamins-Minerals (CENTRUM SILVER ULTRA WOMENS PO) Take by mouth.     simvastatin (ZOCOR) 5 MG tablet TAKE 1 TABLET BY MOUTH EVERY DAY 90 tablet 3   No current facility-administered medications for this visit.  Physical Exam:     BP 138/84    Pulse 76    Temp 98.3 F (36.8 C)    Ht 5' 3.75" (1.619 m)    Wt 149 lb 2 oz (67.6 kg)    BMI 25.80 kg/m   GENERAL:  Pleasant female in NAD PSYCH: : Cooperative, normal affect EENT:  conjunctiva pink, mucous membranes moist, neck supple without masses CARDIAC:  RRR, no murmur heard, no peripheral edema PULM: Normal respiratory effort, lungs CTA bilaterally, no  wheezing ABDOMEN:  Nondistended, soft, nontender. No obvious masses, no hepatomegaly,  normal bowel sounds SKIN:  turgor, no lesions seen Musculoskeletal:  Normal muscle tone, normal strength NEURO: Alert and oriented x 3, no focal neurologic deficits   IMPRESSION and PLAN:    1) GERD with Erosive Esophagitis 2) Hiatal Hernia 3) Heartburn 4) Nausea without vomiting  Brittany Harper is a 72 y.o. female with a long-standing history of GERD and erosive esophagitis, incompletely responsive to acid suppression therapy.  She is requesting antireflux surgery with goal of improved/resolved symptoms, resolution of esophagitis, and stopping acid suppression medications. Discussed the pathophysiology of GERD at length, to include the risks of untreated reflux (ie, strictures, Barretts Esophagus, EAC, etc) as well as the possible treatment with medications vs antireflux surgery. In particular, we discussed the risks, benefits, and alternatives of Transoral Incisionless Fundoplication (TIF), to include Nissen fundoplication, and the patient wishes to proceed with TIF.  - Will schedule for TIF at [facility] she currently resides there is a small inpatient rehabilitation facility.  Given her concerns regarding overnight hospital admission and COVID-19, she is requesting consideration of same-day discharge with overnight observation in her local rehabilitation facility.  There is certainly literature to support same-day discharge and select patient population.  If that facility is able to manage an IV, provide IV antiemetics, and can call me directly with any issues, I think this is a reasonable request.  She will get back to me with those answers. -2-3 cm HH measured on recent EGD.  Did discuss the possibility that if the hernia is larger than previously measured, could preclude TIF, and would instead need cTIF.  Discussed further evaluation with either repeat EGD, esophagram, but  she would like to proceed with scheduling for TIF. - NPO at [place] prior to the procedure - Has an allergy to Augmentin.  Plan for for Levaquin 750 mg IV x1 - Has donewell with Codeine - Reviewed postoperative dietary and activity restrictions with patient and provided handout - Additional recommendations regarding medications and post-operative diet to follow TIF completion - Additionally discussed her DNR status.  She has a clear DNR, and discussed this today.  Explained that the DNR will be temporarily suspended in the perioperative period.  Additionally plan for intubation with general anesthesia.  She understands and is okay with proceeding. - All questions answered          As above, the indications, risks, and benefits of EGD and TIF were explained to the patient in detail. Risks include but are not limited to bleeding, perforation, adverse reaction to medications, and cardiopulmonary compromise. Sequelae include but are not limited to the possibility of surgery, hositalization, and mortality. The patient verbalized understanding and wished to proceed.   I spent over 40 minutes of face-to-face time with the patient. Greater than 50% of the time was spent counseling and coordinating care.    Brittany Harper ,DO, FACG 08/31/2018, 9:18 AM  Cc: Wilfrid Lund,  MD

## 2018-08-31 NOTE — Patient Instructions (Addendum)
If you are age 75 or older, your body mass index should be between 23-30. Your Body mass index is 25.8 kg/m. If this is out of the aforementioned range listed, please consider follow up with your Primary Care Provider.  If you are age 64 or younger, your body mass index should be between 19-25. Your Body mass index is 25.8 kg/m. If this is out of the aformentioned range listed, please consider follow up with your Primary Care Provider.   To help prevent the possible spread of infection to our patients, communities, and staff; we will be implementing the following measures:  As of now we are not allowing any visitors/family members to accompany you to any upcoming appointments with [facility]. If you have any concerns about this please contact our office to discuss prior to the appointment.   It has been recommended to you by your physician that you have a(n) TIF completed. We are planning to schedule your TIF for October 2020 after speaking with your insurance and benefits.  For more information on the TIF procedure please visit the website at [email or web address]  It was a pleasure to see you today!  Vito Cirigliano, D.O.

## 2018-09-01 ENCOUNTER — Telehealth: Payer: Self-pay | Admitting: Gastroenterology

## 2018-09-01 DIAGNOSIS — K449 Diaphragmatic hernia without obstruction or gangrene: Secondary | ICD-10-CM

## 2018-09-01 DIAGNOSIS — R110 Nausea: Secondary | ICD-10-CM

## 2018-09-01 DIAGNOSIS — K219 Gastro-esophageal reflux disease without esophagitis: Secondary | ICD-10-CM

## 2018-09-01 DIAGNOSIS — K2100 Gastro-esophageal reflux disease with esophagitis, without bleeding: Secondary | ICD-10-CM

## 2018-09-04 ENCOUNTER — Other Ambulatory Visit: Payer: Self-pay | Admitting: Internal Medicine

## 2018-09-04 NOTE — Telephone Encounter (Signed)
Great, thank you!

## 2018-09-04 NOTE — Telephone Encounter (Signed)
Called and spoke with patient- patient is requesting to know what kind of diet she will be needing post TIF procedure as she is needing to notify the dietary department at the facility she lives at currently;  Patient also wanting to let Dr. Bryan Lemma know that she feels staying the night at the hospital after her surgery is the best option for her at this time;

## 2018-09-04 NOTE — Telephone Encounter (Signed)
Patient returning phone call states she needs to speak with someone today regarding her AVS and has questions about Tif.

## 2018-09-04 NOTE — Telephone Encounter (Signed)
Post TIF diet printed and mailed to patient; address verified with patient;

## 2018-09-06 ENCOUNTER — Other Ambulatory Visit: Payer: Self-pay

## 2018-09-06 DIAGNOSIS — R110 Nausea: Secondary | ICD-10-CM

## 2018-09-06 DIAGNOSIS — K449 Diaphragmatic hernia without obstruction or gangrene: Secondary | ICD-10-CM

## 2018-09-06 DIAGNOSIS — K2100 Gastro-esophageal reflux disease with esophagitis, without bleeding: Secondary | ICD-10-CM

## 2018-09-06 NOTE — Telephone Encounter (Signed)
Great. Thank you.

## 2018-09-06 NOTE — Telephone Encounter (Signed)
Called and spoke with patient concerning TIF procedure; patient is agreeable to have procedure on 10/16/2018 at 9:30 am; patient was informed that instructions for COVID screening, prep prior to procedure, and post op diet and f/u appt would be mailed to her and sent through [place]; patient verbalized understanding and was also informed to call back to the office should questions/concerns arise;

## 2018-09-13 DIAGNOSIS — D1801 Hemangioma of skin and subcutaneous tissue: Secondary | ICD-10-CM | POA: Diagnosis not present

## 2018-09-13 DIAGNOSIS — L821 Other seborrheic keratosis: Secondary | ICD-10-CM | POA: Diagnosis not present

## 2018-09-13 DIAGNOSIS — D225 Melanocytic nevi of trunk: Secondary | ICD-10-CM | POA: Diagnosis not present

## 2018-09-13 DIAGNOSIS — L814 Other melanin hyperpigmentation: Secondary | ICD-10-CM | POA: Diagnosis not present

## 2018-09-13 DIAGNOSIS — L570 Actinic keratosis: Secondary | ICD-10-CM | POA: Diagnosis not present

## 2018-09-13 DIAGNOSIS — L718 Other rosacea: Secondary | ICD-10-CM | POA: Diagnosis not present

## 2018-09-21 ENCOUNTER — Encounter: Payer: Self-pay | Admitting: Internal Medicine

## 2018-10-12 ENCOUNTER — Other Ambulatory Visit: Payer: Self-pay

## 2018-10-12 ENCOUNTER — Encounter (HOSPITAL_COMMUNITY): Payer: Self-pay | Admitting: *Deleted

## 2018-10-12 NOTE — Progress Notes (Signed)
Pre-procedureal phone call attempted for pt's 10-16-18 procedure with Dr. Bryan Lemma. Voice message left.

## 2018-10-13 ENCOUNTER — Other Ambulatory Visit (HOSPITAL_COMMUNITY)
Admission: RE | Admit: 2018-10-13 | Discharge: 2018-10-13 | Disposition: A | Discharge status: Home / Self Care | Payer: Medicare PPO | Source: Ambulatory Visit | Attending: Gastroenterology | Admitting: Gastroenterology

## 2018-10-13 DIAGNOSIS — Z20828 Contact with and (suspected) exposure to other viral communicable diseases: Secondary | ICD-10-CM | POA: Insufficient documentation

## 2018-10-13 DIAGNOSIS — Z01812 Encounter for preprocedural laboratory examination: Secondary | ICD-10-CM | POA: Insufficient documentation

## 2018-10-14 LAB — NOVEL CORONAVIRUS, NAA (HOSP ORDER, SEND-OUT TO REF LAB; TAT 18-24 HRS): SARS-CoV-2, NAA: NOT DETECTED

## 2018-10-16 ENCOUNTER — Encounter (HOSPITAL_COMMUNITY): Payer: Self-pay | Admitting: *Deleted

## 2018-10-16 ENCOUNTER — Encounter (HOSPITAL_COMMUNITY)
Admission: AD | Disposition: A | Discharge status: Home / Self Care | Payer: Self-pay | Source: Home / Self Care | Attending: Gastroenterology

## 2018-10-16 ENCOUNTER — Ambulatory Visit (HOSPITAL_COMMUNITY): Payer: Medicare PPO | Admitting: Certified Registered Nurse Anesthetist

## 2018-10-16 ENCOUNTER — Other Ambulatory Visit: Payer: Self-pay

## 2018-10-16 ENCOUNTER — Observation Stay (HOSPITAL_COMMUNITY)
Admission: AD | Admit: 2018-10-16 | Discharge: 2018-10-17 | Disposition: A | Discharge status: Home / Self Care | Payer: Medicare PPO | Attending: Gastroenterology | Admitting: Gastroenterology

## 2018-10-16 DIAGNOSIS — K2100 Gastro-esophageal reflux disease with esophagitis, without bleeding: Secondary | ICD-10-CM | POA: Diagnosis not present

## 2018-10-16 DIAGNOSIS — K317 Polyp of stomach and duodenum: Secondary | ICD-10-CM | POA: Insufficient documentation

## 2018-10-16 DIAGNOSIS — Z85828 Personal history of other malignant neoplasm of skin: Secondary | ICD-10-CM | POA: Insufficient documentation

## 2018-10-16 DIAGNOSIS — Z9889 Other specified postprocedural states: Secondary | ICD-10-CM

## 2018-10-16 DIAGNOSIS — R110 Nausea: Secondary | ICD-10-CM

## 2018-10-16 DIAGNOSIS — K208 Other esophagitis without bleeding: Secondary | ICD-10-CM | POA: Diagnosis not present

## 2018-10-16 DIAGNOSIS — E785 Hyperlipidemia, unspecified: Secondary | ICD-10-CM | POA: Diagnosis not present

## 2018-10-16 DIAGNOSIS — Z79899 Other long term (current) drug therapy: Secondary | ICD-10-CM | POA: Diagnosis not present

## 2018-10-16 DIAGNOSIS — K449 Diaphragmatic hernia without obstruction or gangrene: Secondary | ICD-10-CM

## 2018-10-16 DIAGNOSIS — J45909 Unspecified asthma, uncomplicated: Secondary | ICD-10-CM | POA: Diagnosis not present

## 2018-10-16 DIAGNOSIS — K259 Gastric ulcer, unspecified as acute or chronic, without hemorrhage or perforation: Secondary | ICD-10-CM

## 2018-10-16 DIAGNOSIS — K228 Other specified diseases of esophagus: Secondary | ICD-10-CM | POA: Insufficient documentation

## 2018-10-16 DIAGNOSIS — K3189 Other diseases of stomach and duodenum: Secondary | ICD-10-CM | POA: Diagnosis not present

## 2018-10-16 HISTORY — DX: Gastro-esophageal reflux disease with esophagitis, without bleeding: K21.00

## 2018-10-16 HISTORY — PX: BIOPSY: SHX5522

## 2018-10-16 HISTORY — PX: TRANSORAL INCISIONLESS FUNDOPLICATION: SHX6840

## 2018-10-16 HISTORY — DX: Diaphragmatic hernia without obstruction or gangrene: K44.9

## 2018-10-16 HISTORY — PX: ESOPHAGOGASTRODUODENOSCOPY (EGD) WITH PROPOFOL: SHX5813

## 2018-10-16 SURGERY — ESOPHAGOGASTRODUODENOSCOPY (EGD) WITH PROPOFOL
Anesthesia: General

## 2018-10-16 MED ORDER — FENTANYL CITRATE (PF) 100 MCG/2ML IJ SOLN
INTRAMUSCULAR | Status: AC
  Filled 2018-10-16: qty 2

## 2018-10-16 MED ORDER — SODIUM CHLORIDE 0.9 % IV SOLN
INTRAVENOUS | Status: DC
  Administered 2018-10-16 (×2): via INTRAVENOUS

## 2018-10-16 MED ORDER — FENTANYL CITRATE (PF) 100 MCG/2ML IJ SOLN
25.0000 ug | INTRAMUSCULAR | Status: DC | PRN
  Administered 2018-10-16: 50 ug via INTRAVENOUS
  Filled 2018-10-16: qty 2

## 2018-10-16 MED ORDER — ACETAMINOPHEN 10 MG/ML IV SOLN
1000.0000 mg | Freq: Once | INTRAVENOUS | Status: AC | PRN
  Filled 2018-10-16: qty 100

## 2018-10-16 MED ORDER — SCOPOLAMINE 1 MG/3DAYS TD PT72
1.0000 | MEDICATED_PATCH | Freq: Once | TRANSDERMAL | Status: DC
  Administered 2018-10-16: 07:00:00 1.5 mg via TRANSDERMAL

## 2018-10-16 MED ORDER — DEXAMETHASONE SODIUM PHOSPHATE 10 MG/ML IJ SOLN
8.0000 mg | Freq: Four times a day (QID) | INTRAMUSCULAR | Status: DC
  Administered 2018-10-16 – 2018-10-17 (×4): 8 mg via INTRAVENOUS
  Filled 2018-10-16 (×4): qty 1

## 2018-10-16 MED ORDER — DEXAMETHASONE SODIUM PHOSPHATE 10 MG/ML IJ SOLN
INTRAMUSCULAR | Status: DC | PRN
  Administered 2018-10-16: 10 mg via INTRAVENOUS

## 2018-10-16 MED ORDER — ONDANSETRON HCL 4 MG/2ML IJ SOLN
INTRAMUSCULAR | Status: DC | PRN
  Administered 2018-10-16: 4 mg via INTRAVENOUS

## 2018-10-16 MED ORDER — ONDANSETRON HCL 4 MG/2ML IJ SOLN
4.0000 mg | Freq: Four times a day (QID) | INTRAMUSCULAR | Status: DC
  Administered 2018-10-16 – 2018-10-17 (×4): 4 mg via INTRAVENOUS
  Filled 2018-10-16 (×4): qty 2

## 2018-10-16 MED ORDER — ONDANSETRON HCL 4 MG/2ML IJ SOLN
INTRAMUSCULAR | Status: AC
  Filled 2018-10-16: qty 2

## 2018-10-16 MED ORDER — SUGAMMADEX SODIUM 200 MG/2ML IV SOLN
INTRAVENOUS | Status: DC | PRN
  Administered 2018-10-16: 200 mg via INTRAVENOUS

## 2018-10-16 MED ORDER — FAMOTIDINE IN NACL 20-0.9 MG/50ML-% IV SOLN
20.0000 mg | Freq: Once | INTRAVENOUS | Status: AC
  Administered 2018-10-16: 07:00:00 20 mg via INTRAVENOUS
  Filled 2018-10-16: qty 50

## 2018-10-16 MED ORDER — SODIUM CHLORIDE 0.9 % IV SOLN: INTRAVENOUS | Status: DC

## 2018-10-16 MED ORDER — PANTOPRAZOLE SODIUM 40 MG PO TBEC
40.0000 mg | DELAYED_RELEASE_TABLET | Freq: Two times a day (BID) | ORAL | Status: DC
  Administered 2018-10-16 (×2): 40 mg via ORAL
  Filled 2018-10-16 (×2): qty 1

## 2018-10-16 MED ORDER — ACETAMINOPHEN 500 MG PO TABS: 1000.0000 mg | ORAL_TABLET | Freq: Once | ORAL | Status: DC | PRN

## 2018-10-16 MED ORDER — LACTATED RINGERS IV SOLN
INTRAVENOUS | Status: DC
  Administered 2018-10-16: 1000 mL via INTRAVENOUS

## 2018-10-16 MED ORDER — PROPOFOL 10 MG/ML IV BOLUS
INTRAVENOUS | Status: DC | PRN
  Administered 2018-10-16: 50 mg via INTRAVENOUS
  Administered 2018-10-16: 110 mg via INTRAVENOUS

## 2018-10-16 MED ORDER — LIDOCAINE VISCOUS HCL 2 % MT SOLN
5.0000 mL | Freq: Three times a day (TID) | OROMUCOSAL | Status: DC | PRN
  Administered 2018-10-16: 5 mL via OROMUCOSAL
  Filled 2018-10-16: qty 15

## 2018-10-16 MED ORDER — SCOPOLAMINE 1 MG/3DAYS TD PT72
MEDICATED_PATCH | TRANSDERMAL | Status: AC
  Filled 2018-10-16: qty 1

## 2018-10-16 MED ORDER — METOCLOPRAMIDE HCL 5 MG/ML IJ SOLN
10.0000 mg | Freq: Four times a day (QID) | INTRAMUSCULAR | Status: DC
  Administered 2018-10-16 – 2018-10-17 (×4): 10 mg via INTRAVENOUS
  Filled 2018-10-16 (×4): qty 2

## 2018-10-16 MED ORDER — ACETAMINOPHEN 160 MG/5ML PO SOLN
1000.0000 mg | Freq: Once | ORAL | Status: DC | PRN
  Filled 2018-10-16: qty 40.6

## 2018-10-16 MED ORDER — LIDOCAINE 2% (20 MG/ML) 5 ML SYRINGE
INTRAMUSCULAR | Status: DC | PRN
  Administered 2018-10-16: 40 mg via INTRAVENOUS

## 2018-10-16 MED ORDER — CLINDAMYCIN PHOSPHATE 900 MG/50ML IV SOLN
900.0000 mg | Freq: Once | INTRAVENOUS | Status: AC
  Administered 2018-10-16: 900 mg via INTRAVENOUS
  Filled 2018-10-16: qty 50

## 2018-10-16 MED ORDER — OXYCODONE HCL 5 MG PO TABS: 5.0000 mg | ORAL_TABLET | Freq: Once | ORAL | Status: DC | PRN

## 2018-10-16 MED ORDER — SUCCINYLCHOLINE CHLORIDE 200 MG/10ML IV SOSY
PREFILLED_SYRINGE | INTRAVENOUS | Status: DC | PRN
  Administered 2018-10-16: 60 mg via INTRAVENOUS

## 2018-10-16 MED ORDER — EPHEDRINE SULFATE-NACL 50-0.9 MG/10ML-% IV SOSY
PREFILLED_SYRINGE | INTRAVENOUS | Status: DC | PRN
  Administered 2018-10-16: 5 mg via INTRAVENOUS

## 2018-10-16 MED ORDER — OXYCODONE HCL 5 MG/5ML PO SOLN: 5.0000 mg | Freq: Once | ORAL | Status: DC | PRN

## 2018-10-16 MED ORDER — ONDANSETRON HCL 4 MG/2ML IJ SOLN
4.0000 mg | Freq: Once | INTRAMUSCULAR | Status: AC
  Administered 2018-10-16: 4 mg via INTRAVENOUS

## 2018-10-16 MED ORDER — ACETAMINOPHEN 10 MG/ML IV SOLN
650.0000 mg | Freq: Four times a day (QID) | INTRAVENOUS | Status: DC | PRN
  Administered 2018-10-16: 650 mg via INTRAVENOUS
  Filled 2018-10-16: qty 65

## 2018-10-16 MED ORDER — SIMETHICONE 80 MG PO CHEW: 80.0000 mg | CHEWABLE_TABLET | Freq: Four times a day (QID) | ORAL | Status: DC | PRN

## 2018-10-16 MED ORDER — ROCURONIUM BROMIDE 50 MG/5ML IV SOSY
PREFILLED_SYRINGE | INTRAVENOUS | Status: DC | PRN
  Administered 2018-10-16: 50 mg via INTRAVENOUS

## 2018-10-16 MED ORDER — PROPOFOL 10 MG/ML IV BOLUS
INTRAVENOUS | Status: AC
  Filled 2018-10-16: qty 20

## 2018-10-16 MED ORDER — FENTANYL CITRATE (PF) 100 MCG/2ML IJ SOLN
INTRAMUSCULAR | Status: DC | PRN
  Administered 2018-10-16: 100 ug via INTRAVENOUS
  Administered 2018-10-16: 50 ug via INTRAVENOUS

## 2018-10-16 SURGICAL SUPPLY — 15 items
BLOCK BITE 60FR ADLT L/F BLUE (MISCELLANEOUS) ×3 IMPLANT
ELECT REM PT RETURN 9FT ADLT (ELECTROSURGICAL)
ELECTRODE REM PT RTRN 9FT ADLT (ELECTROSURGICAL) IMPLANT
FORCEP RJ3 GP 1.8X160 W-NEEDLE (CUTTING FORCEPS) IMPLANT
FORCEPS BIOP RAD 4 LRG CAP 4 (CUTTING FORCEPS) IMPLANT
NEEDLE SCLEROTHERAPY 25GX240 (NEEDLE) IMPLANT
PROBE APC STR FIRE (PROBE) IMPLANT
PROBE INJECTION GOLD (MISCELLANEOUS)
PROBE INJECTION GOLD 7FR (MISCELLANEOUS) IMPLANT
SNARE SHORT THROW 13M SML OVAL (MISCELLANEOUS) IMPLANT
SYR 50ML LL SCALE MARK (SYRINGE) IMPLANT
Serosafuse Implantable Fastener ×3 IMPLANT
TUBING ENDO SMARTCAP PENTAX (MISCELLANEOUS) ×6 IMPLANT
TUBING IRRIGATION ENDOGATOR (MISCELLANEOUS) ×3 IMPLANT
WATER STERILE IRR 1000ML POUR (IV SOLUTION) IMPLANT

## 2018-10-16 NOTE — H&P (Signed)
P  Chief Complaint:    GERD with erosive esophagitis, Transoral Incisionless Fundoplication (TIF)  GI History: Brittany Harper is a 73 year old female referred to me by Dr. Loletha Carrow for evaluation of possible antireflux intervention with Transoral Incisionless Fundoplication (TIF) with a goal to stop or significantly reduce acid suppression therapy.  She has a longstanding history of reflux for 20+ years, characterized by heartburn, regurgitation, epigastric discomfort.  Will now have postprandial heartburn with most meals despite acid suppression therapy.  Additionally with chronic early satiety and nausea.  She has rare episodes of dysphagia which resolve with water.    Has trialed multiple H2 RA and PPI medications in the past, starting with Omeprazole for 12 years, then eventually lost efficacy. Has also trialed ranitidine (no improvement), Tums, Pepcid. Retrialed Prilosec recently with improvement, but again eventual loss of efficacy.  Additionally, she does not want to take medications long-term, especially PPIs.  Recent GES was normal through 3 hours, with mildly delayed emptying at 85% (normal >90%) at 4 hours.  GERD history: -Index symptoms: Heartburn, regurgitation, epigastric discomfort, nausea -Medications trialed: Prilosec, Tums, Pepcid -Current medications: Pepcid 10 mg bid -Complications: Erosive esophagitis -GERD-HRQL score: 33/50  GERD evaluation: -EGD (6/20, Dr. Loletha Carrow): Grade 2 esophagitis, patent Schatzki's ring, 2 to 3 cm HH, fundic gland polyps - Prior EGD in [place] approx 12 years ago also with EE per patient  HPI:     Patient is a 73 y.o. female presenting today for TIF as a means to control her reflux and stop or significantly reduce need for acid suppression therapy.     Review of systems:     No chest pain, no SOB, no fevers, no urinary sx   Past Medical History:  Diagnosis Date  . Allergy   . Arthritis   . Asthma   . Cancer ([place])    basal cell  . GERD  (gastroesophageal reflux disease)   . Hyperlipidemia     Patient's surgical history, family medical history, social history, medications and allergies were all reviewed in Epic    Current Facility-Administered Medications  Medication Dose Route Frequency Provider Last Rate Last Dose  . 0.9 %  sodium chloride infusion   Intravenous Continuous Shizuo Biskup V, DO      . clindamycin (CLEOCIN) IVPB 900 mg  900 mg Intravenous Once Joahan Swatzell V, DO      . lactated ringers infusion   Intravenous Continuous Sameul Tagle V, DO 10 mL/hr at 10/16/18 0710 1,000 mL at 10/16/18 0710  . scopolamine (TRANSDERM-SCOP) 1 MG/3DAYS 1.5 mg  1 patch Transdermal Once Trever Streater V, DO   1.5 mg at 10/16/18 0706    Physical Exam:     BP (!) 142/68   Pulse 70   Temp 98.1 F (36.7 C) (Oral)   Resp 11   Ht 5' 3.5" (1.613 m)   Wt 66.2 kg   SpO2 98%   BMI 25.46 kg/m   GENERAL:  Pleasant female in NAD PSYCH: : Cooperative, normal affect EENT:  conjunctiva pink, mucous membranes moist, neck supple without masses CARDIAC:  RRR, no murmur heard, no peripheral edema PULM: Normal respiratory effort, lungs CTA bilaterally, no wheezing ABDOMEN:  Nondistended, soft, nontender. No obvious masses, no hepatomegaly,  normal bowel sounds SKIN:  turgor, no lesions seen Musculoskeletal:  Normal muscle tone, normal strength NEURO: Alert and oriented x 3, no focal neurologic deficits   IMPRESSION and PLAN:     1) GERD with Erosive Esophagitis 2) Hiatal  Hernia 3) Heartburn 4) Nausea without vomiting  Brittany Harper is a 73 y.o. female with a long-standing history of GERD and erosive esophagitis, incompletely responsive to acid suppression therapy.  She is requesting antireflux surgery with goal of improved/resolved symptoms, resolution of esophagitis, and stopping acid suppression medications.  - Scheduled for TIF today in End[facility] Suite.  - Anticipated 23 hour stay  - Additional recommendations  regarding medications and post-operative diet to follow TIF completion  - Sincerely appreciate the assistance by the Hospitalist Service for the inpatient management and overnight observation of this patient  As above, the indications, risks, and benefits of EGD and TIF were explained to the patient in detail. Risks include but are not limited to bleeding, perforation, adverse reaction to medications, and cardiopulmonary compromise. Sequelae include but are not limited to the possibility of surgery, hositalization, and mortality. The patient verbalized understanding and wishes to proceed.           Lavena Bullion ,DO, FACG 10/16/2018, 7:44 AM

## 2018-10-16 NOTE — Progress Notes (Signed)
[facility] ROUNDING NOTE   Subjective:  Completed EGD with Transoral Incisionless Fundoplication (TIF) earlier today without complications.  She did have some postoperative nausea/vomiting, which has improved with antiemetics.  Additionally with some expected postoperative sore throat/substernal discomfort.  Improved with APAP.  Otherwise, tolerating liquids and no additional complaints.  Daughter at bedside.  Objective: Vital signs in last 24 hours: Temp:  [97.6 F (36.4 C)-98.2 F (36.8 C)] 97.6 F (36.4 C) (10/12 1303) Pulse Rate:  [66-83] 67 (10/12 1348) Resp:  [11-16] 16 (10/12 1303) BP: (107-146)/(55-89) 107/55 (10/12 1348) SpO2:  [96 %-100 %] 99 % (10/12 1348) Weight:  [66.2 kg] 66.2 kg (10/12 0642) Last BM Date: 10/15/18 General: NAD Abdomen: Soft, ND, mild postoperative TTP without rebound or guarding Ext: no c/c/e    Intake/Output from previous day: No intake/output data recorded. Intake/Output this shift: Total I/O In: 1475.8 [P.O.:480; I.V.:945.8; IV Piggyback:50] Out: 5 [Blood:5]   Lab Results: No results for input(s): WBC, HGB, PLT, MCV in the last 72 hours. BMET No results for input(s): NA, K, CL, CO2, GLUCOSE, BUN, CREATININE, CALCIUM in the last 72 hours. LFT No results for input(s): PROT, ALBUMIN, AST, ALT, ALKPHOS, BILITOT, BILIDIR, IBILI in the last 72 hours. PT/INR No results for input(s): INR in the last 72 hours.    Imaging/Other results: No results found.    Assessment and Plan  Brittany Harper is a 73 y.o. female s/p EGD with Transoral Incisionless Fundoplication (TIF) today.  -Plan for overnight observation -Zofran 4 mg IV every 6 hours x24 hours, then prn -Reglan 10 mg every 6 hours x24 hours, then prn -Resume scopolamine patch x3 days (applied preop) -Protonix 40 mg p.o. BID x2 weeks, then 40 mg daily x2 weeks, then 20 mg daily x1 week then prn -Decadron 8 mg every 6 hours times max 5 doses -Gas-X (simethicone) 4225 mg  p.o. prn every 6 hours gas pain, abdominal discomfort -Viscous Lidocaine every 4 hours as needed for esophageal pain -Tylenol Q6 hours prn pain -Colace 100 mg p.o. twice daily if taking pain medications -Clear liquid diet okay overnight -Okay to ambulate with assist around the ward -Please do not hesitate to contact me directly with any postoperative questions or concerns [phone])    Lavena Bullion, DO  10/16/2018, 2:11 PM [facility] Pager [phone]

## 2018-10-16 NOTE — Interval H&P Note (Signed)
History and Physical Interval Note:  10/16/2018 7:47 AM  Brittany Harper  has presented today for surgery, with the diagnosis of GERD/nausea w/o vomiting.  The various methods of treatment have been discussed with the patient and family. After consideration of risks, benefits and other options for treatment, the patient has consented to  Procedure(s): ESOPHAGOGASTRODUODENOSCOPY (EGD) WITH PROPOFOL (N/A) TRANSORAL INCISIONLESS FUNDOPLICATION (N/A) as a surgical intervention.  [name] history has been reviewed, patient examined, no change in status, stable for surgery.  I have reviewed the patient's chart and labs.  Questions were answered to the patient's satisfaction.     Dominic Pea Cirigliano

## 2018-10-16 NOTE — Transfer of Care (Signed)
Immediate Anesthesia Transfer of Care Note  Patient: Brittany Harper  Procedure(s) Performed: ESOPHAGOGASTRODUODENOSCOPY (EGD) WITH PROPOFOL (N/A ) TRANSORAL INCISIONLESS FUNDOPLICATION (N/A )  Patient Location: PACU  Anesthesia Type:General  Level of Consciousness: drowsy and patient cooperative  Airway & Oxygen Therapy: Patient Spontanous Breathing and Patient connected to face mask oxygen  Post-op Assessment: Report given to RN and Post -op Vital signs reviewed and stable  Post vital signs: Reviewed and stable  Last Vitals:  Vitals Value Taken Time  BP    Temp    Pulse    Resp    SpO2      Last Pain:  Vitals:   10/16/18 0754  TempSrc:   PainSc: 0-No pain         Complications: No apparent anesthesia complications

## 2018-10-16 NOTE — Anesthesia Procedure Notes (Signed)
Procedure Name: Intubation Date/Time: 10/16/2018 8:00 AM Performed by: Montel Clock, CRNA Pre-anesthesia Checklist: Patient identified, Emergency Drugs available, Suction available, Patient being monitored and Timeout performed Patient Re-evaluated:Patient Re-evaluated prior to induction Oxygen Delivery Method: Circle system utilized Preoxygenation: Pre-oxygenation with 100% oxygen Induction Type: IV induction and Rapid sequence Laryngoscope Size: Mac and 3 Grade View: Grade II Tube type: Oral Tube size: 7.0 mm Number of attempts: 1 Airway Equipment and Method: Stylet Placement Confirmation: ETT inserted through vocal cords under direct vision,  positive ETCO2 and breath sounds checked- equal and bilateral Secured at: 21 cm Tube secured with: Tape Dental Injury: Teeth and Oropharynx as per pre-operative assessment

## 2018-10-16 NOTE — Anesthesia Preprocedure Evaluation (Addendum)
Anesthesia Evaluation  Patient identified by MRN, date of birth, ID band Patient awake    Reviewed: Allergy & Precautions, NPO status , Patient's Chart, lab work & pertinent test results  History of Anesthesia Complications Negative for: history of anesthetic complications  Airway Mallampati: III  TM Distance: >3 FB Neck ROM: Full    Dental  (+) Dental Advisory Given   Pulmonary asthma , neg recent URI,    breath sounds clear to auscultation       Cardiovascular negative cardio ROS   Rhythm:Regular     Neuro/Psych negative neurological ROS  negative psych ROS   GI/Hepatic Neg liver ROS, GERD  ,  Endo/Other  negative endocrine ROS  Renal/GU negative Renal ROS     Musculoskeletal   Abdominal   Peds  Hematology negative hematology ROS (+)   Anesthesia Other Findings   Reproductive/Obstetrics                            Anesthesia Physical Anesthesia Plan  ASA: II  Anesthesia Plan: General   Post-op Pain Management:    Induction: Intravenous  PONV Risk Score and Plan: 3 and Ondansetron and Dexamethasone  Airway Management Planned: Oral ETT  Additional Equipment: None  Intra-op Plan:   Post-operative Plan: Extubation in OR  Informed Consent: I have reviewed the patients History and Physical, chart, labs and discussed the procedure including the risks, benefits and alternatives for the proposed anesthesia with the patient or authorized representative who has indicated his/her understanding and acceptance.     Dental advisory given  Plan Discussed with: CRNA and Surgeon  Anesthesia Plan Comments:         Anesthesia Quick Evaluation

## 2018-10-16 NOTE — Op Note (Signed)
[facility] Patient Name: Brittany Harper Procedure Date: 10/16/2018 MRN: [id number] Attending MD: Gerrit Heck , MD Date of Birth: 03/20/1945 CSN: [id number] Age: 73 Admit Type: Outpatient Procedure:                Upper GI endoscopy Indications:              Heartburn, For therapy of reflux esophagitis, For                            therapy of hiatal hernia                           73 yo female with a long standing history of GERD                            presents for Transoral Incisionless Fundoplication                            (TIF) for treatment of reflux. Providers:                Gerrit Heck, MD, Jobe Igo, RN, Marguerita Merles, Technician Referring MD:              Medicines:                General Anesthesia Complications:            No immediate complications. Estimated Blood Loss:     Estimated blood loss was minimal. Procedure:                Pre-Anesthesia Assessment:                           - Prior to the procedure, a History and Physical                            was performed, and patient medications and                            allergies were reviewed. [name] tolerance of                            previous anesthesia was also reviewed. The risks                            and benefits of the procedure and the sedation                            options and risks were discussed with the patient.                            All questions were answered, and informed consent                            was obtained.  Prior Anticoagulants: The patient has                            taken no previous anticoagulant or antiplatelet                            agents. ASA Grade Assessment: II - A patient with                            mild systemic disease. After reviewing the risks                            and benefits, the patient was deemed in                            satisfactory condition to  undergo the procedure.                           After obtaining informed consent, the endoscope was                            passed under direct vision. Throughout the                            procedure, the patient's blood pressure, pulse, and                            oxygen saturations were monitored continuously. The                            [id number] Olympus gastroscope was                            introduced through the mouth, and advanced to the                            second part of duodenum. The upper GI endoscopy was                            accomplished without difficulty. The patient                            tolerated the procedure well. Scope In: Scope Out: Findings:      LA Grade B (one or more mucosal breaks greater than 5 mm, not extending       between the tops of two mucosal folds) esophagitis with no bleeding was       found 41 cm from the incisors. There was a small area of mildly nodular       mucosa at the GEJ. Biopsies were taken with a cold forceps for       histology. Estimated blood loss was minimal.      The decision was made to perform transoral fundoplication with the       EsophyX Z+ system. Before the procedure, the gastroesophageal flap valve  was classified as Hill Grade II (fold present, opens with respiration).       The endoscope was withdrawn, placed through the plication device,       reinserted into the patient and advanced past the level of the GE       junction at 41 cm from the incisors and into the stomach. Next, the       endoscope was advanced beyond the device and retroflexed. The first       plication site was identified at the 11 o'clock position. With the       device in the proper position, the helical retractor was deployed and       tissue was pulled into the mold before it was closed. The device was       rotated, suction was applied using the invaginator, then the device was       advanced slightly and  two H-shaped fasteners were placed. The device was       reloaded and the process repeated in order to deploy a total of six       fasteners at the first site. The device was then rotated to the 1       o'clock position after which the helical retractor was used to grasp       additional tissue within the mold before rotation and deployment of a       total of ten fasteners at the second site. To complete reconstruction of       the valve, additional fasteners were deployed at the following sites:       four fasteners at 5 o'clock and four fasteners at 7 o'clock positions.       In total, 24 fasteners contributed to create a valve measuring 3 cm in       length which involved 300 degrees of the circumference upon retroflexed       view. The EsophyX device and endoscope were then removed. Relook       endoscopy was performed prior to the conclusion of the case to confirm       the above findings. Estimated blood loss was minimal.      A 2 cm hiatal hernia was present. This was completed reduced with       Transoral Incisionless Fundoplication.      One non-bleeding superficial gastric ulcer was found in the gastric       antrum. The lesion was 3 mm in largest dimension. Biopsies were taken       with a cold forceps for histology. Estimated blood loss was minimal.      A few small sessile polyps were found in the gastric fundus and in the       gastric body.      The duodenal bulb, first portion of the duodenum and second portion of       the duodenum were normal. Impression:               - LA Grade B reflux esophagitis. Biopsied.                           - 2 cm hiatal hernia. This was reduced today.                           - Non-bleeding gastric ulcer. Biopsied.                           -  A few gastric polyps. These were previously                            biopsied and consistent with benign fundic gland                            polyps. No repeat biopsies performed today.                            - Normal duodenal bulb, first portion of the                            duodenum and second portion of the duodenum.                           - Successful EsophyX transoral fundoplication was                            performed using 24 fasteners. Moderate Sedation:      Not Applicable - Patient had care per Anesthesia. Recommendation:           -Admit to surgical ward for overnight observation                            with anticipated discharge tomorrow                           -Zofran 4 mg IV every 6 hours x24 hours, then prn                           -Reglan 10 mg every 6 hours x24 hours, then prn                           -Resume scopolamine patch x3 days (applied preop)                           -Protonix 40 mg p.o. BID x2 weeks, then 40 mg daily                            x2 weeks, then 20 mg daily x1 week then prn                           -Decadron 8 mg every 6 hours times max 5 doses                           -Gas-X (simethicone) 4225 mg p.o. prn every 6 hours                            gas pain, abdominal discomfort                           -Tylenol 3 (APAP 120 mg/codeine 12 mg per 5 mL): 15  mL's every 4 hours prn pain                           -Colace 100 mg p.o. twice daily if taking pain                            medications                           -Clear liquid diet okay overnight                           -Okay to ambulate with assist around the ward                           -Please do not hesitate to contact me directly with                            any postoperative questions or concerns Procedure Code(s):        --- Professional ---                           [phone], Esophagogastroduodenoscopy, flexible,                            transoral; with biopsy, single or multiple Diagnosis Code(s):        --- Professional ---                           K21.0, Gastro-esophageal reflux disease with                             esophagitis                           K44.9, Diaphragmatic hernia without obstruction or                            gangrene                           K25.9, Gastric ulcer, unspecified as acute or                            chronic, without hemorrhage or perforation                           K31.7, Polyp of stomach and duodenum                           R12, Heartburn CPT copyright 2019 [employer]. All rights reserved. The codes documented in this report are preliminary and upon coder review may  be revised to meet current compliance requirements. Gerrit Heck, MD 10/16/2018 9:22:19 AM Number of Addenda: 0

## 2018-10-17 ENCOUNTER — Telehealth: Payer: Self-pay | Admitting: *Deleted

## 2018-10-17 ENCOUNTER — Other Ambulatory Visit: Payer: Self-pay

## 2018-10-17 DIAGNOSIS — E785 Hyperlipidemia, unspecified: Secondary | ICD-10-CM | POA: Diagnosis not present

## 2018-10-17 DIAGNOSIS — Z79899 Other long term (current) drug therapy: Secondary | ICD-10-CM | POA: Diagnosis not present

## 2018-10-17 DIAGNOSIS — Z85828 Personal history of other malignant neoplasm of skin: Secondary | ICD-10-CM | POA: Diagnosis not present

## 2018-10-17 DIAGNOSIS — K259 Gastric ulcer, unspecified as acute or chronic, without hemorrhage or perforation: Secondary | ICD-10-CM | POA: Diagnosis not present

## 2018-10-17 DIAGNOSIS — Z9889 Other specified postprocedural states: Secondary | ICD-10-CM | POA: Diagnosis not present

## 2018-10-17 DIAGNOSIS — K228 Other specified diseases of esophagus: Secondary | ICD-10-CM | POA: Diagnosis not present

## 2018-10-17 DIAGNOSIS — J45909 Unspecified asthma, uncomplicated: Secondary | ICD-10-CM | POA: Diagnosis not present

## 2018-10-17 DIAGNOSIS — K2100 Gastro-esophageal reflux disease with esophagitis, without bleeding: Secondary | ICD-10-CM | POA: Diagnosis not present

## 2018-10-17 DIAGNOSIS — R110 Nausea: Secondary | ICD-10-CM

## 2018-10-17 DIAGNOSIS — K449 Diaphragmatic hernia without obstruction or gangrene: Secondary | ICD-10-CM | POA: Diagnosis not present

## 2018-10-17 DIAGNOSIS — K317 Polyp of stomach and duodenum: Secondary | ICD-10-CM | POA: Diagnosis not present

## 2018-10-17 LAB — SURGICAL PATHOLOGY

## 2018-10-17 MED ORDER — PNEUMOCOCCAL VAC POLYVALENT 25 MCG/0.5ML IJ INJ: 0.5000 mL | INJECTION | INTRAMUSCULAR | Status: DC

## 2018-10-17 MED ORDER — SIMETHICONE 40 MG/0.6ML PO SUSP: 80.0000 mg | Freq: Four times a day (QID) | ORAL | 1 refills | Status: DC | PRN

## 2018-10-17 MED ORDER — METOCLOPRAMIDE HCL 10 MG PO TABS: 10.0000 mg | ORAL_TABLET | Freq: Three times a day (TID) | ORAL | 1 refills | Status: DC

## 2018-10-17 MED ORDER — SIMETHICONE 80 MG PO CHEW: 80.0000 mg | CHEWABLE_TABLET | Freq: Four times a day (QID) | ORAL | 0 refills | Status: DC | PRN

## 2018-10-17 MED ORDER — ONDANSETRON HCL 4 MG PO TABS: 4.0000 mg | ORAL_TABLET | Freq: Every day | ORAL | 1 refills | Status: DC | PRN

## 2018-10-17 MED ORDER — PANTOPRAZOLE SODIUM 40 MG PO TBEC: 40.0000 mg | DELAYED_RELEASE_TABLET | Freq: Two times a day (BID) | ORAL | 1 refills | Status: DC

## 2018-10-17 NOTE — Anesthesia Postprocedure Evaluation (Signed)
Anesthesia Post Note  Patient: Brittany Harper  Procedure(s) Performed: ESOPHAGOGASTRODUODENOSCOPY (EGD) WITH PROPOFOL (N/A ) TRANSORAL INCISIONLESS FUNDOPLICATION (N/A ) BIOPSY     Patient location during evaluation: PACU Anesthesia Type: General Level of consciousness: awake and alert Pain management: pain level controlled Vital Signs Assessment: post-procedure vital signs reviewed and stable Respiratory status: spontaneous breathing, nonlabored ventilation, respiratory function stable and patient connected to nasal cannula oxygen Cardiovascular status: blood pressure returned to baseline and stable Postop Assessment: no apparent nausea or vomiting Anesthetic complications: no    Last Vitals:  Vitals:   10/17/18 0153 10/17/18 0518  BP: 107/60 110/65  Pulse: (!) 58 64  Resp: 16 18  Temp: 36.9 C 37 C  SpO2: 96% 96%    Last Pain:  Vitals:   10/17/18 0518  TempSrc: Oral  PainSc:                  Yuriko Portales

## 2018-10-17 NOTE — Discharge Summary (Signed)
[facility] DISCHARGE SUMMARY  Date of admission: 10/16/2018 Date of discharge: 10/17/2018 Attending: Dr. Bryan Lemma Discharge diagnosis: GERD with Erosive Esophagitis, hiatal hernia Consultations: None Procedures performed: EGD with Transoral Incisionless Fundoplication and endoscopic hiatal hernia repair History: See admission H&P below Exam: See below Hospital course: 1) GERD with Erosive Esophagitis 2) Hiatal hernia Patient underwent uncomplicated EGD with Transoral Incisionless Fundoplication (TIF) and endoscopic hiatal hernia repair on 10/16/2018 by Dr. Bryan Lemma.  Admitted for overnight observation without any issues. Discharge vital signs: See below Discharge labs: None Disposition: To home in stable condition Follow-up appointments: 11/07/2018 at 1:40 p.m. with Dr. Bryan Lemma    Subjective: No acute events overnight.  Did have some nausea/vomiting briefly, which resolved with antiemetics.  Otherwise did well overnight, tolerating clear liquids without issue.  She is without any complaints today, no abdominal pain, nausea, vomiting.  She is eagerly anticipating discharge today.   Objective: Vital signs in last 24 hours: Temp:  [97.6 F (36.4 C)-98.6 F (37 C)] 98.6 F (37 C) (10/13 0518) Pulse Rate:  [55-83] 64 (10/13 0518) Resp:  [12-18] 18 (10/13 0518) BP: (107-146)/(54-89) 110/65 (10/13 0518) SpO2:  [96 %-100 %] 96 % (10/13 0518) Last BM Date: 10/15/18 General: NAD Lungs: CTA bilaterally, no w/r/r Heart:  RRR, no m/r/g  Abdomen: Soft, NT, ND, +BS  Ext: No c/c/e    Intake/Output from previous day: 10/12 0701 - 10/13 0700 In: 3036.5 [P.O.:1470; I.V.:1386.5; IV Piggyback:180] Out: 1305 [Urine:1300; Blood:5] Intake/Output this shift: Total I/O In: -  Out: 250 [Urine:250]   Lab Results: No results for input(s): WBC, HGB, PLT, MCV in the last 72 hours. BMET No results for input(s): NA, K, CL, CO2, GLUCOSE, BUN, CREATININE, CALCIUM in the last  72 hours. LFT No results for input(s): PROT, ALBUMIN, AST, ALT, ALKPHOS, BILITOT, BILIDIR, IBILI in the last 72 hours. PT/INR No results for input(s): INR in the last 72 hours.    Imaging/Other results: No results found.    Assessment and Plan:  Brittany Harper is a 73 y.o. female s/p EGD with Transoral Incisionless Fundoplication (TIF) completed yesterday with no events on overnight observation. Will plan on d/c to home today with the following plan:   - Protonix 40 mg PO BID for 2 weeks, then 40 mg daily for 2 weeks, then 20 mg daily for 1 week, then prn  - D/c with Zofran 4 mg PO prn Q6 hours for nausea  - D/c with Reglan 10 mg PO prn Q6 hours for nausea  - D/c with Simethicone 80 mg PO prn Q6 hours for bloating/abdominal discomfort   Diet:  - 2 weeks of liquid soft foods followed by 4 weeks slowly progressive diet back to regular  - Provided with handout for post operative diet plan   Post Op Activity:  - Week 1: encourage short distance walking, minimal physical activity, no lifting >5 lbs  - Week 2: Slow climbing stairs, no intense exercise, no lifting >5 lbs  - Week 3-6: No intense exercise, may lift up to 25 lbs  - Week 7: Resume normal activity   - To follow-up with me on 11/07/2018 at 1:40 pm in the [facility] at [place], DO  10/17/2018, 9:41 AM [facility] Pager [phone]

## 2018-10-17 NOTE — Telephone Encounter (Signed)
Pt was on TCM report admitted 10/16/18 for observation. Pt underwent uncomplicated EGD with Transoral Incisionless Fundoplication (TIF) and endoscopic hiatal hernia. Procedure went well, and was D/C 10/17/18. Pt will follow=up w/specialist in 2 weeks.Marland KitchenJohny Chess

## 2018-10-17 NOTE — Progress Notes (Signed)
Discharge and medication instructions reviewed with patient. Questions answered and patient denied further questions. No prescriptions given. Donne Hazel, RN

## 2018-11-03 ENCOUNTER — Ambulatory Visit: Payer: Medicare PPO | Admitting: Gastroenterology

## 2018-11-07 ENCOUNTER — Ambulatory Visit: Payer: Medicare PPO | Admitting: Gastroenterology

## 2018-11-07 ENCOUNTER — Other Ambulatory Visit: Payer: Self-pay

## 2018-11-07 ENCOUNTER — Encounter: Payer: Self-pay | Admitting: Gastroenterology

## 2018-11-07 VITALS — BP 118/70 | HR 60 | Temp 98.2°F | Ht 63.75 in | Wt 141.1 lb

## 2018-11-07 DIAGNOSIS — K219 Gastro-esophageal reflux disease without esophagitis: Secondary | ICD-10-CM | POA: Diagnosis not present

## 2018-11-07 DIAGNOSIS — Z9889 Other specified postprocedural states: Secondary | ICD-10-CM

## 2018-11-07 NOTE — Progress Notes (Signed)
P  Chief Complaint:  Postoperative follow-up    GI History: Brittany Harper a 73 year old femalereferred to me byDr. Rayann Heman evaluation of possible antireflux intervention with Transoral Incisionless Fundoplication (TIF) with a goal to stop or significantly reduce acid suppression therapy.She has a longstanding history of refluxfor 20+ years, characterized by heartburn, regurgitation, epigastric discomfort.Postprandial heartburn with most mealsdespite acid suppression therapy. Additionally with chronic early satiety and nausea.   Has trialed multiple H2 RA and PPI medications in the past,starting withOmeprazole for 12 years, then eventually lost efficacy. Has also trialed ranitidine (no improvement), Tums, Pepcid. Retrialed Prilosec with improvement, but again eventual loss of efficacy. Does not want to take medications long-term, especially PPIs.  GES was normal through 3 hours, with mildly delayed emptying at 85% (normal>90%) at 4 hours.  GERD history: -Index symptoms:Heartburn, regurgitation, epigastric discomfort, nausea -Medications trialed:Prilosec, Tums, Pepcid -Current medications:Pepcid 10 mg bid -Complications:Erosive esophagitis -GERD-HRQLscore: 33/50  GERD evaluation: -EGD with TIF (10/16/2018, Dr. Bryan Lemma): LA Grade B esophagitis,, Hill grade 2 valve, 2 cm HH.  Successful TIF with 24 fasteners placed.  Gastric ulcer -EGD(6/20, Dr. Loletha Carrow): Grade 2 esophagitis, patent Schatzki's ring, 2 to 3 cm HH, fundic gland polyps - Prior EGD in [place] approx 12 years ago also with EE per patient  HPI:     Patient is a 73 y.o. female presenting to the Gastroenterology Clinic for postoperative follow-up after TIF on 10/16/2018.  She states she feels well and much improved since the TIF.  Has decreased Protonix to 40 mg/day without any breakthrough symptoms.  Tolerating postoperative diet well and without any issues.  Has had 3 instances of globus type sensation,  which quickly cleared with water.  Back to baseline activity.  Offers no complaints today, and is very pleased with the results so far.  Review of systems:     No chest pain, no SOB, no fevers, no urinary sx   Past Medical History:  Diagnosis Date  . Allergy   . Arthritis   . Asthma   . Cancer ([place])    basal cell  . GERD (gastroesophageal reflux disease)   . Hyperlipidemia     Patient's surgical history, family medical history, social history, medications and allergies were all reviewed in Epic    Current Outpatient Medications  Medication Sig Dispense Refill  . AMBULATORY NON FORMULARY MEDICATION Take 1 tablet by mouth 2 (two) times daily. Viviscal otc supplement    . Azelaic Acid 15 % cream After skin is thoroughly washed and patted dry, gently but thoroughly massage a thin film of azelaic acid cream into the affected area twice daily, in the morning and evening. (Patient taking differently: Apply 1 application topically daily. After skin is thoroughly washed and patted dry, gently but thoroughly massage a thin film of azelaic acid cream into the affected area) 150 g 3  . Calcium Carbonate-Vit D-Min (CALCIUM 1200) 1200-1000 MG-UNIT CHEW Chew 2 tablets by mouth daily.    . fexofenadine (ALLEGRA) 180 MG tablet Take 180 mg by mouth daily.    . hypromellose (SYSTANE OVERNIGHT THERAPY) 0.3 % GEL ophthalmic ointment Place 1 application into both eyes at bedtime.    Marland Kitchen ibuprofen (ADVIL) 200 MG tablet Take 200 mg by mouth every 8 (eight) hours as needed for headache or moderate pain.    Marland Kitchen metoCLOPramide (REGLAN) 10 MG tablet Take 1 tablet (10 mg total) by mouth 3 (three) times daily with meals. 90 tablet 1  . metroNIDAZOLE (METROGEL) 0.75 % gel Apply  1 application topically 2 (two) times daily. (Patient taking differently: Apply 1 application topically at bedtime. ) 45 g 0  . Misc Natural Products (OSTEO BI-FLEX ADV JOINT SHIELD PO) Take 2 tablets by mouth daily with lunch.     . Multiple  Vitamins-Minerals (CENTRUM SILVER ULTRA WOMENS PO) Take 1 tablet by mouth daily at 12 noon.     . ondansetron (ZOFRAN) 4 MG tablet Take 1 tablet (4 mg total) by mouth daily as needed for nausea or vomiting. 30 tablet 1  . pantoprazole (PROTONIX) 40 MG tablet Take 1 tablet (40 mg total) by mouth 2 (two) times daily. 90 tablet 1  . Polyethyl Glycol-Propyl Glycol (SYSTANE OP) Place 1 drop into both eyes daily.    Marland Kitchen PREMARIN vaginal cream PLACE 1 APPLICATORFUL VAGINALLY ONCE A WEEK. (Patient taking differently: Place 1 Applicatorful vaginally 2 (two) times a week. ) 30 g 11  . simethicone (MYLICON) 40 [id number] drops Take 1.2 mLs (80 mg total) by mouth 4 (four) times daily as needed for flatulence. 30 mL 1  . simethicone (MYLICON) 80 MG chewable tablet Chew 1 tablet (80 mg total) by mouth every 6 (six) hours as needed for flatulence (gas, bloating, abdominal discomfort). 30 tablet 0  . simvastatin (ZOCOR) 5 MG tablet TAKE 1 TABLET BY MOUTH EVERY DAY (Patient taking differently: Take 5 mg by mouth daily with supper. ) 90 tablet 3   No current facility-administered medications for this visit.     Physical Exam:     There were no vitals taken for this visit.  GENERAL:  Pleasant female in NAD PSYCH: : Cooperative, normal affect EENT:  conjunctiva pink, mucous membranes moist, neck supple without masses CARDIAC:  RRR, no murmur heard, no peripheral edema PULM: Normal respiratory effort, lungs CTA bilaterally, no wheezing ABDOMEN:  Nondistended, soft, nontender. No obvious masses, no hepatomegaly,  normal bowel sounds SKIN:  turgor, no lesions seen Musculoskeletal:  Normal muscle tone, normal strength NEURO: Alert and oriented x 3, no focal neurologic deficits   IMPRESSION and PLAN:     1) GERD s/p Transoral Incisionless Fundoplication (TIF) -Doing very well postoperatively and without any issues today -Resume postoperative diet and advance as tolerated -Continue to advance exercise/activity  per postoperative guidelines -Continue Protonix 40 mg/day, then titrate off next week (4 weeks postop) and transition to on-demand antacids -RTC in 3 months or sooner as needed          Lavena Bullion ,DO, FACG 11/07/2018, 1:26 PM

## 2018-11-07 NOTE — Patient Instructions (Signed)
Follow up in 3 months  Normal BMI (Body Mass Index- based on height and weight) is between 23 and 30. Your BMI today is Body mass index is 24.41 kg/m. Marland Kitchen Please consider follow up  regarding your BMI with your Primary Care Provider.  It was a pleasure to see you today!  Vito Cirigliano, D.O.

## 2019-01-08 ENCOUNTER — Encounter: Payer: Medicare PPO | Admitting: Internal Medicine

## 2019-02-08 ENCOUNTER — Other Ambulatory Visit: Payer: Self-pay

## 2019-02-08 ENCOUNTER — Ambulatory Visit: Payer: Medicare PPO | Admitting: Gastroenterology

## 2019-02-08 ENCOUNTER — Encounter: Payer: Self-pay | Admitting: Gastroenterology

## 2019-02-08 VITALS — BP 122/68 | HR 64 | Ht 63.0 in | Wt 140.0 lb

## 2019-02-08 DIAGNOSIS — K219 Gastro-esophageal reflux disease without esophagitis: Secondary | ICD-10-CM

## 2019-02-08 DIAGNOSIS — Z9889 Other specified postprocedural states: Secondary | ICD-10-CM | POA: Diagnosis not present

## 2019-02-08 DIAGNOSIS — R1013 Epigastric pain: Secondary | ICD-10-CM

## 2019-02-08 NOTE — Patient Instructions (Signed)
We have given you samples of the following medication to take: FD guard  Follow up in 1 year  It was a pleasure to see you today!  Vito Cirigliano, D.O.

## 2019-02-08 NOTE — Progress Notes (Signed)
P  Chief Complaint:    History of GERD, postoperative follow-up  GI History: Callee Druschel a 74 year old femalewith a longstanding history of refluxfor 20+ years, characterized by heartburn, regurgitation, epigastric discomfort, nausea, postprandial heartburn, now s/p Transoral Incisionless Fundoplication (TIF) in [id number] with resolution of reflux symptoms.  Prior to TIF, had trialed multiple H2RA and PPI medications in the past,starting withOmeprazole for 12 years, then eventually lost efficacy. Has also trialed ranitidine (no improvement), Tums, Pepcid. Retrialed Prilosec with improvement, but again eventual loss of efficacy.   GES was normal through 3 hours, with mildly delayed emptying at 85% (normal>90%) at 4 hours.  GERD history: -Index symptoms:Heartburn, regurgitation, epigastric discomfort, nausea -Medications trialed:Prilosec, Tums, Pepcid -Current medications:Pepcid 10 mg bid -Complications:Erosive esophagitis -GERD-HRQLscore: 33/50 (on medications, pre-TIF)  GERD evaluation: -EGD with TIF (10/16/2018, Dr. Bryan Lemma): LA Grade B esophagitis,, Hill grade 2 valve, 2 cm HH.  Successful TIF with 24 fasteners placed.  Gastric ulcer -EGD(6/20, Dr. Loletha Carrow): Grade 2 esophagitis, patent Schatzki's ring, 2 to 3 cm HH, fundic gland polyps - Prior EGD in [place] approx 12 years ago also with EE per patient  -Cologuard negative in 2020  HPI:     Patient is a 74 y.o. female presenting to [facility] for continued postoperative follow-up after TIF on 10/16/2018.  Was last seen by me on 11/07/2018, with significant clinical improvement at that time.  Has since titrated off Protonix without breakthrough reflux symptoms.  Tolerating all p.o. intake without any issue.  Back to baseline activity.    Does c/o intermittent epigastric pain/dyspepsia.  Reflux symptoms progress still well controlled without any breakthrough, but postprandial dyspepsia is a new issue.   No radiation. Has trialed Pepcid OTC prn which improved symptoms.  Symptoms not lifestyle limiting.  Typically post prandial, but not related to food types.  No associated nausea/vomiting, diarrhea, constipation, melena, hematochezia.  Otherwise, quite happy with results of the TIF and appreciated to be done with reflux symptoms.   No new labs or imaging for review today.   Review of systems:     No chest pain, no SOB, no fevers, no urinary sx   Past Medical History:  Diagnosis Date  . Allergy   . Arthritis   . Asthma   . Cancer ([place])    basal cell  . GERD (gastroesophageal reflux disease)   . Hyperlipidemia     Patient's surgical history, family medical history, social history, medications and allergies were all reviewed in Epic    Current Outpatient Medications  Medication Sig Dispense Refill  . AMBULATORY NON FORMULARY MEDICATION Take 1 tablet by mouth 2 (two) times daily. Viviscal otc supplement    . Azelaic Acid 15 % cream After skin is thoroughly washed and patted dry, gently but thoroughly massage a thin film of azelaic acid cream into the affected area twice daily, in the morning and evening. (Patient taking differently: Apply 1 application topically daily. After skin is thoroughly washed and patted dry, gently but thoroughly massage a thin film of azelaic acid cream into the affected area) 150 g 3  . Calcium Carbonate-Vit D-Min (CALCIUM 1200) 1200-1000 MG-UNIT CHEW Chew 2 tablets by mouth daily.    . fexofenadine (ALLEGRA) 180 MG tablet Take 180 mg by mouth daily.    . hypromellose (SYSTANE OVERNIGHT THERAPY) 0.3 % GEL ophthalmic ointment Place 1 application into both eyes at bedtime.    Marland Kitchen ibuprofen (ADVIL) 200 MG tablet Take 200 mg by mouth every 8 (eight) hours  as needed for headache or moderate pain.    Marland Kitchen metoCLOPramide (REGLAN) 10 MG tablet Take 1 tablet (10 mg total) by mouth 3 (three) times daily with meals. 90 tablet 1  . metroNIDAZOLE (METROGEL) 0.75 % gel Apply 1  application topically 2 (two) times daily. (Patient taking differently: Apply 1 application topically at bedtime. ) 45 g 0  . Misc Natural Products (OSTEO BI-FLEX ADV JOINT SHIELD PO) Take 2 tablets by mouth daily with lunch.     . Multiple Vitamins-Minerals (CENTRUM SILVER ULTRA WOMENS PO) Take 1 tablet by mouth daily at 12 noon.     Vladimir Faster Glycol-Propyl Glycol (SYSTANE OP) Place 1 drop into both eyes daily.    Marland Kitchen PREMARIN vaginal cream PLACE 1 APPLICATORFUL VAGINALLY ONCE A WEEK. (Patient taking differently: Place 1 Applicatorful vaginally 2 (two) times a week. ) 30 g 11  . simvastatin (ZOCOR) 5 MG tablet TAKE 1 TABLET BY MOUTH EVERY DAY (Patient taking differently: Take 5 mg by mouth daily with supper. ) 90 tablet 3   No current facility-administered medications for this visit.    Physical Exam:     BP 122/68   Pulse 64   Ht 5\' 3"  (1.6 m)   Wt 140 lb (63.5 kg)   BMI 24.80 kg/m   GENERAL:  Pleasant female in NAD PSYCH: : Cooperative, normal affect EENT:  conjunctiva pink, mucous membranes moist, neck supple without masses CARDIAC:  RRR, no murmur heard, no peripheral edema PULM: Normal respiratory effort, lungs CTA bilaterally, no wheezing ABDOMEN:  Nondistended, soft, nontender. No obvious masses, no hepatomegaly,  normal bowel sounds SKIN:  turgor, no lesions seen Musculoskeletal:  Normal muscle tone, normal strength NEURO: Alert and oriented x 3, no focal neurologic deficits   IMPRESSION and PLAN:    1) Dyspepsia: Discussed the DDx for dyspepsia, to include PUD, gastritis, nonulcer dyspepsia, H. pylori.  Given resolution of index reflux symptoms, do not suspect this to be atypical reflux manifestation.  Plan for the following: -Start with trial of FD guard -If no improvement with FD guard, plan for H. pylori testing.  If that 2 is unrevealing, then would plan for EGD -Hold H2 RA or PPI while trialing FD guard  2) History of GERD 3) History of fundoplication  (TIF) -Reflux symptoms well controlled since TIF.  No need for acid suppression therapy for reflux symptoms since procedure.  Tolerating all p.o. intake  4) CRC screening -Cologuard completed in 2020 and was negative -Can discuss repeat screening modalities in 2023  -Okay for 1 year follow-up from a reflux/fundoplication standpoint.  Can certainly follow-up sooner regarding dyspepsia as needed         Lavena Bullion ,DO, FACG 02/08/2019, 8:20 AM

## 2019-03-08 DIAGNOSIS — H04123 Dry eye syndrome of bilateral lacrimal glands: Secondary | ICD-10-CM | POA: Diagnosis not present

## 2019-03-08 DIAGNOSIS — Z961 Presence of intraocular lens: Secondary | ICD-10-CM | POA: Diagnosis not present

## 2019-03-08 DIAGNOSIS — H18513 Endothelial corneal dystrophy, bilateral: Secondary | ICD-10-CM | POA: Diagnosis not present

## 2019-03-08 DIAGNOSIS — H52203 Unspecified astigmatism, bilateral: Secondary | ICD-10-CM | POA: Diagnosis not present

## 2019-03-12 ENCOUNTER — Encounter: Payer: Medicare PPO | Admitting: Internal Medicine

## 2019-03-15 ENCOUNTER — Encounter: Payer: Self-pay | Admitting: Internal Medicine

## 2019-03-15 ENCOUNTER — Ambulatory Visit (INDEPENDENT_AMBULATORY_CARE_PROVIDER_SITE_OTHER): Payer: Medicare PPO | Admitting: Internal Medicine

## 2019-03-15 ENCOUNTER — Other Ambulatory Visit: Payer: Self-pay

## 2019-03-15 VITALS — BP 130/82 | HR 68 | Temp 98.5°F | Ht 63.0 in | Wt 134.5 lb

## 2019-03-15 DIAGNOSIS — E785 Hyperlipidemia, unspecified: Secondary | ICD-10-CM | POA: Diagnosis not present

## 2019-03-15 DIAGNOSIS — Z9889 Other specified postprocedural states: Secondary | ICD-10-CM | POA: Diagnosis not present

## 2019-03-15 DIAGNOSIS — Z0000 Encounter for general adult medical examination without abnormal findings: Secondary | ICD-10-CM | POA: Diagnosis not present

## 2019-03-15 LAB — COMPREHENSIVE METABOLIC PANEL
ALT: 14 U/L (ref 0–35)
AST: 20 U/L (ref 0–37)
Albumin: 4.4 g/dL (ref 3.5–5.2)
Alkaline Phosphatase: 68 U/L (ref 39–117)
BUN: 17 mg/dL (ref 6–23)
CO2: 25 mEq/L (ref 19–32)
Calcium: 9.7 mg/dL (ref 8.4–10.5)
Chloride: 103 mEq/L (ref 96–112)
Creatinine, Ser: 0.84 mg/dL (ref 0.40–1.20)
GFR: 66.03 mL/min (ref >60.00)
Glucose, Bld: 92 mg/dL (ref 70–99)
Potassium: 5.1 mEq/L (ref 3.5–5.1)
Sodium: 141 mEq/L (ref 135–145)
Total Bilirubin: 0.5 mg/dL (ref 0.2–1.2)
Total Protein: 7.1 g/dL (ref 6.0–8.3)

## 2019-03-15 LAB — CBC
HCT: 45.9 % (ref 36.0–46.0)
Hemoglobin: 15.2 g/dL — ABNORMAL HIGH (ref 12.0–15.0)
MCHC: 33.1 g/dL (ref 30.0–36.0)
MCV: 91.1 fl (ref 78.0–100.0)
Platelets: 249 10*3/uL (ref 150.0–400.0)
RBC: 5.04 Mil/uL (ref 3.87–5.11)
RDW: 12.9 % (ref 11.5–15.5)
WBC: 6.8 10*3/uL (ref 4.0–10.5)

## 2019-03-15 LAB — LIPID PANEL
Cholesterol: 179 mg/dL (ref 0–200)
HDL: 62.4 mg/dL (ref >39.00)
LDL Cholesterol: 95 mg/dL (ref 0–99)
NonHDL: 116.51
Total CHOL/HDL Ratio: 3
Triglycerides: 107 mg/dL (ref 0.0–149.0)
VLDL: 21.4 mg/dL (ref 0.0–40.0)

## 2019-03-15 LAB — TSH: TSH: 1.4 u[IU]/mL (ref 0.35–4.50)

## 2019-03-15 MED ORDER — GABAPENTIN 300 MG PO CAPS: 300.0000 mg | ORAL_CAPSULE | Freq: Every day | ORAL | 3 refills | Status: DC | PRN

## 2019-03-15 NOTE — Patient Instructions (Addendum)
WE have sent in the nerve pain medicine gabapentin to try if you get that pain at the onset to see if this helps.   Health Maintenance, Female Adopting a healthy lifestyle and getting preventive care are important in promoting health and wellness. Ask your health care provider about:  The right schedule for you to have regular tests and exams.  Things you can do on your own to prevent diseases and keep yourself healthy. What should I know about diet, weight, and exercise? Eat a healthy diet   Eat a diet that includes plenty of vegetables, fruits, low-fat dairy products, and lean protein.  Do not eat a lot of foods that are high in solid fats, added sugars, or sodium. Maintain a healthy weight Body mass index (BMI) is used to identify weight problems. It estimates body fat based on height and weight. Your health care provider can help determine your BMI and help you achieve or maintain a healthy weight. Get regular exercise Get regular exercise. This is one of the most important things you can do for your health. Most adults should:  Exercise for at least 150 minutes each week. The exercise should increase your heart rate and make you sweat (moderate-intensity exercise).  Do strengthening exercises at least twice a week. This is in addition to the moderate-intensity exercise.  Spend less time sitting. Even light physical activity can be beneficial. Watch cholesterol and blood lipids Have your blood tested for lipids and cholesterol at 76 years of age, then have this test every 5 years. Have your cholesterol levels checked more often if:  Your lipid or cholesterol levels are high.  You are older than 76 years of age.  You are at high risk for heart disease. What should I know about cancer screening? Depending on your health history and family history, you may need to have cancer screening at various ages. This may include screening for:  Breast cancer.  Cervical  cancer.  Colorectal cancer.  Skin cancer.  Lung cancer. What should I know about heart disease, diabetes, and high blood pressure? Blood pressure and heart disease  High blood pressure causes heart disease and increases the risk of stroke. This is more likely to develop in people who have high blood pressure readings, are of African descent, or are overweight.  Have your blood pressure checked: ? Every 3-5 years if you are 65-6 years of age. ? Every year if you are 55 years old or older. Diabetes Have regular diabetes screenings. This checks your fasting blood sugar level. Have the screening done:  Once every three years after age 50 if you are at a normal weight and have a low risk for diabetes.  More often and at a younger age if you are overweight or have a high risk for diabetes. What should I know about preventing infection? Hepatitis B If you have a higher risk for hepatitis B, you should be screened for this virus. Talk with your health care provider to find out if you are at risk for hepatitis B infection. Hepatitis C Testing is recommended for:  Everyone born from [birth year] through 1967.  Anyone with known risk factors for hepatitis C. Sexually transmitted infections (STIs)  Get screened for STIs, including gonorrhea and chlamydia, if: ? You are sexually active and are younger than 76 years of age. ? You are older than 76 years of age and your health care provider tells you that you are at risk for this type of infection. ?  Your sexual activity has changed since you were last screened, and you are at increased risk for chlamydia or gonorrhea. Ask your health care provider if you are at risk.  Ask your health care provider about whether you are at high risk for HIV. Your health care provider may recommend a prescription medicine to help prevent HIV infection. If you choose to take medicine to prevent HIV, you should first get tested for HIV. You should then be tested every 3  months for as long as you are taking the medicine. Pregnancy  If you are about to stop having your period (premenopausal) and you may become pregnant, seek counseling before you get pregnant.  Take 400 to 800 micrograms (mcg) of folic acid every day if you become pregnant.  Ask for birth control (contraception) if you want to prevent pregnancy. Osteoporosis and menopause Osteoporosis is a disease in which the bones lose minerals and strength with aging. This can result in bone fractures. If you are 30 years old or older, or if you are at risk for osteoporosis and fractures, ask your health care provider if you should:  Be screened for bone loss.  Take a calcium or vitamin D supplement to lower your risk of fractures.  Be given hormone replacement therapy (HRT) to treat symptoms of menopause. Follow these instructions at home: Lifestyle  Do not use any products that contain nicotine or tobacco, such as cigarettes, e-cigarettes, and chewing tobacco. If you need help quitting, ask your health care provider.  Do not use street drugs.  Do not share needles.  Ask your health care provider for help if you need support or information about quitting drugs. Alcohol use  Do not drink alcohol if: ? Your health care provider tells you not to drink. ? You are pregnant, may be pregnant, or are planning to become pregnant.  If you drink alcohol: ? Limit how much you use to 0-1 drink a day. ? Limit intake if you are breastfeeding.  Be aware of how much alcohol is in your drink. In the [place] one drink equals one 12 oz bottle of beer (355 mL), one 5 oz glass of wine (148 mL), or one 1 oz glass of hard liquor (44 mL). General instructions  Schedule regular health, dental, and eye exams.  Stay current with your vaccines.  Tell your health care provider if: ? You often feel depressed. ? You have ever been abused or do not feel safe at home. Summary  Adopting a healthy lifestyle and getting  preventive care are important in promoting health and wellness.  Follow your health care provider's instructions about healthy diet, exercising, and getting tested or screened for diseases.  Follow your health care provider's instructions on monitoring your cholesterol and blood pressure. This information is not intended to replace advice given to you by your health care provider. Make sure you discuss any questions you have with your health care provider. Document Revised: 12/14/2017 Document Reviewed: 12/14/2017 Elsevier Patient Education  2020 [employer].

## 2019-03-15 NOTE — Progress Notes (Signed)
Subjective:   Patient ID: Brittany Harper, female    DOB: 02/19/1945, 74 y.o.   MRN: [id number]  HPI Here for medicare wellness and physical, no new complaints. Please see A/P for status and treatment of chronic medical problems.   Diet: heart healthy  Physical activity: sedentary Depression/mood screen: negative Hearing: intact to whispered voice Visual acuity: grossly normal with lens, performs annual eye exam  ADLs: capable Fall risk: none Home safety: good Cognitive evaluation: intact to orientation, naming, recall and repetition EOL planning: adv directives discussed    Office Visit from 03/15/2019 in [place] at [facility] Total Score  2        Office Visit from 03/15/2019 in [place] at [facility]  PHQ-9 Total Score  4      I have personally reviewed and have noted 1. [name] medical and social history - reviewed today no changes 2. Their use of alcohol, tobacco or illicit drugs 3. Their current medications and supplements 4. [name] functional ability including ADL's, fall risks, home safety risks and hearing or visual impairment. 5. Diet and physical activities 6. Evidence for depression or mood disorders 7. Care team reviewed and updated  Patient Care Team: Hoyt Koch, MD as PCP - General (Internal Medicine) Past Medical History:  Diagnosis Date  . Allergy   . Arthritis   . Asthma   . Cancer ([place])    basal cell  . GERD (gastroesophageal reflux disease)   . Hyperlipidemia    Past Surgical History:  Procedure Laterality Date  . ABDOMINAL HYSTERECTOMY    . BIOPSY  10/16/2018   Procedure: BIOPSY;  Surgeon: Lavena Bullion, DO;  Location: [facility];  Service: Gastroenterology;;  . ESOPHAGOGASTRODUODENOSCOPY (EGD) WITH PROPOFOL N/A 10/16/2018   Procedure: ESOPHAGOGASTRODUODENOSCOPY (EGD) WITH PROPOFOL;  Surgeon: Lavena Bullion, DO;  Location: [facility];  Service: Gastroenterology;  Laterality:  N/A;  . TRANSORAL INCISIONLESS FUNDOPLICATION N/A 123XX123   Procedure: TRANSORAL INCISIONLESS FUNDOPLICATION;  Surgeon: Lavena Bullion, DO;  Location: [facility];  Service: Gastroenterology;  Laterality: N/A;   Family History  Problem Relation Age of Onset  . Arthritis Mother   . Breast cancer Mother        breast and ovarian, and basal cell  . Ovarian cancer Mother   . Basal cell carcinoma Mother   . Arthritis Father   . Basal cell carcinoma Father        basal cell  . Diabetes Sister   . Diabetes Paternal Uncle   . Diabetes Paternal Grandmother   . Diabetes Paternal Grandfather   . Stomach cancer Paternal Aunt   . Colon cancer Neg Hx   . Pancreatic cancer Neg Hx     Review of Systems  Constitutional: Negative.   HENT: Negative.   Eyes: Negative.   Respiratory: Negative for cough, chest tightness and shortness of breath.   Cardiovascular: Negative for chest pain, palpitations and leg swelling.  Gastrointestinal: Negative for abdominal distention, abdominal pain, constipation, diarrhea, nausea and vomiting.  Musculoskeletal: Negative.   Skin: Negative.   Neurological: Negative.   Psychiatric/Behavioral: Negative.     Objective:  Physical Exam Constitutional:      Appearance: She is well-developed.  HENT:     Head: Normocephalic and atraumatic.  Cardiovascular:     Rate and Rhythm: Normal rate and regular rhythm.  Pulmonary:     Effort: Pulmonary effort is normal. No respiratory distress.     Breath sounds: Normal breath  sounds. No wheezing or rales.  Abdominal:     General: Bowel sounds are normal. There is no distension.     Palpations: Abdomen is soft.     Tenderness: There is no abdominal tenderness. There is no rebound.  Musculoskeletal:     Cervical back: Normal range of motion.  Skin:    General: Skin is warm and dry.  Neurological:     Mental Status: She is alert and oriented to person, place, and time.     Coordination: Coordination normal.      Vitals:   03/15/19 0858  BP: 130/82  Pulse: 68  Temp: 98.5 F (36.9 C)  TempSrc: Oral  SpO2: 97%  Weight: 134 lb 8 oz (61 kg)  Height: 5\' 3"  (1.6 m)    This visit occurred during the SARS-CoV-2 public health emergency.  Safety protocols were in place, including screening questions prior to the visit, additional usage of staff PPE, and extensive cleaning of exam room while observing appropriate contact time as indicated for disinfecting solutions.   Assessment & Plan:

## 2019-03-15 NOTE — Assessment & Plan Note (Signed)
Checking lipid panel and adjust simvastatin 5 mg daily.

## 2019-03-15 NOTE — Assessment & Plan Note (Signed)
Still having some nerve pain associated with this. We talked about recovery timeline up to a year can still be improvement. Rx gabapentin if she has a large flare to use for pain.

## 2019-03-15 NOTE — Assessment & Plan Note (Signed)
Flu shot up to date. Covid-19 moderna complete. Pneumonia complete. Shingrix counseled. Tetanus due 2027. Cologuard due but she is doing yearly FOBT instead due in August. Mammogram due 2022, pap smear aged out and dexa aged out of further. Counseled about sun safety and mole surveillance. Counseled about the dangers of distracted driving. Given 10 year screening recommendations.

## 2019-03-18 ENCOUNTER — Other Ambulatory Visit: Payer: Self-pay | Admitting: Internal Medicine

## 2019-05-08 ENCOUNTER — Other Ambulatory Visit: Payer: Self-pay | Admitting: Internal Medicine

## 2019-05-08 DIAGNOSIS — Z1231 Encounter for screening mammogram for malignant neoplasm of breast: Secondary | ICD-10-CM

## 2019-06-06 ENCOUNTER — Other Ambulatory Visit: Payer: Self-pay

## 2019-06-06 ENCOUNTER — Ambulatory Visit
Admission: RE | Admit: 2019-06-06 | Discharge: 2019-06-06 | Disposition: A | Discharge status: Home / Self Care | Payer: Medicare PPO | Source: Ambulatory Visit

## 2019-06-06 DIAGNOSIS — Z1231 Encounter for screening mammogram for malignant neoplasm of breast: Secondary | ICD-10-CM

## 2019-06-18 ENCOUNTER — Other Ambulatory Visit: Payer: Self-pay | Admitting: Gastroenterology

## 2019-06-18 ENCOUNTER — Telehealth: Payer: Self-pay | Admitting: Gastroenterology

## 2019-06-18 MED ORDER — VENLAFAXINE HCL 25 MG PO TABS: 25.0000 mg | ORAL_TABLET | Freq: Every day | ORAL | 1 refills | Status: DC

## 2019-06-18 NOTE — Telephone Encounter (Signed)
Patient called in earlier today.  I called her back and discussed symptoms over the phone.  States that she started having right ear pain at the end of January.  Pain was intermittent.  Pain subsequently recurred a month or so later, to the point that she was prescribed Neurontin by her PCM.  Neurontin initially worked, but seemingly less efficacious now.  Pain has been increasing, described as sternal chest pain and pain in the right ear.  No dysphagia, heartburn, regurgitation, nausea/vomiting.  Discussed her symptoms of back today by phone.  Certainly postoperative chest pain or even pain radiating to the shoulder will described after fundoplication.  Radiation to the ear not readily reported in literature.  Otherwise no cardiopulmonary symptoms.  Decreasing response to gabapentin.  Will trial course of SNRI.  If no appreciable response, can either repeat endoscopy (?  With [facility]) or potentially refer to Pain Management for further evaluation and treatment.  -Start Effexor 25 mg qhs.  If suboptimal response after 4-7 days, increase to 37.5 mg, and can continue to increase to 75 mg qhs.  Provide 90-day supply -Schedule follow-up appointment with me in the next 2-3 weeks to review for response to Rx - All questions answered and appreciative for call back and discussion

## 2019-06-18 NOTE — Telephone Encounter (Signed)
Spoke to patient. Medication sent to pharmacy. Follow up appointment scheduled. All questions answered. Patient voiced understanding.

## 2019-06-18 NOTE — Telephone Encounter (Signed)
Pt reported that she has been experiencing nerve pain after TIF procedure.  Please advise.

## 2019-06-18 NOTE — Telephone Encounter (Signed)
Spoke to patient and sent Dr Bryan Lemma a message. Will call patient back once I hear from MD.

## 2019-06-26 ENCOUNTER — Telehealth: Payer: Self-pay | Admitting: Gastroenterology

## 2019-06-26 NOTE — Telephone Encounter (Signed)
Spoke to patient who started on Effexor 1 week ago for post TIF nerve pain. She states that the medication has helped but still has intermittent nerve pain. She was instructed to increase her Effexor to 37.5 mg at bedtime, and call the office next week to report how she is doing. All questions answered. Patient voiced understanding.

## 2019-07-16 ENCOUNTER — Telehealth: Payer: Self-pay | Admitting: Gastroenterology

## 2019-07-16 NOTE — Telephone Encounter (Signed)
Pt is requesting a call back from [facility] regarding her Effexor medication.

## 2019-07-16 NOTE — Telephone Encounter (Signed)
Patient was started on Effexor 25 mg at bedtime on 06/18/19 for post op TIF chest pain. Then increased to 37.5 mg on 06/26/19 Per MD. Her pain improved and she decreased back to 25 mg on 6/29. Patient states her chest pain is completely gone and wants to stop the Effexor all together. Patient was advised to continue taking the medication until Dr Bryan Lemma can slowly taper her off under his orders. She agreed on this plan. Patient also  Cancelled her 07/19/19 office visit.

## 2019-07-17 NOTE — Telephone Encounter (Signed)
Spoke to patient to inform her of Dr Vivia Ewing recommendation. Patient will taper Effexor to 12.5 mg for 1 week then D/C. She does not take an acid suppressor,only FD Guard before meals os ordered. A follow up appointment has been scheduled for mid September. All questions answered.Patient voiced understanding.

## 2019-07-17 NOTE — Telephone Encounter (Signed)
I am really glad to hear that her pain has now resolved, ans still gone with the Effexor taper back to 25 mg. Plan to decrease to 12.5 mg for 7 days, then can discontinue altogether. Can you confirm if she off all acid suppression therapy as well? Please have her f/u with me in the office in the next 2-3 months or so, or sooner as needed. Thanks.

## 2019-07-19 ENCOUNTER — Ambulatory Visit: Payer: Medicare PPO | Admitting: Gastroenterology

## 2019-09-14 DIAGNOSIS — D225 Melanocytic nevi of trunk: Secondary | ICD-10-CM | POA: Diagnosis not present

## 2019-09-14 DIAGNOSIS — L718 Other rosacea: Secondary | ICD-10-CM | POA: Diagnosis not present

## 2019-09-14 DIAGNOSIS — L821 Other seborrheic keratosis: Secondary | ICD-10-CM | POA: Diagnosis not present

## 2019-09-14 DIAGNOSIS — L720 Epidermal cyst: Secondary | ICD-10-CM | POA: Diagnosis not present

## 2019-09-14 DIAGNOSIS — D1801 Hemangioma of skin and subcutaneous tissue: Secondary | ICD-10-CM | POA: Diagnosis not present

## 2019-09-14 DIAGNOSIS — L814 Other melanin hyperpigmentation: Secondary | ICD-10-CM | POA: Diagnosis not present

## 2019-09-17 ENCOUNTER — Ambulatory Visit: Payer: Medicare PPO | Admitting: Gastroenterology

## 2019-10-10 ENCOUNTER — Ambulatory Visit: Payer: Medicare PPO | Admitting: Gastroenterology

## 2019-10-10 ENCOUNTER — Encounter: Payer: Self-pay | Admitting: Gastroenterology

## 2019-10-10 VITALS — BP 126/82 | HR 95 | Ht 63.5 in | Wt 132.2 lb

## 2019-10-10 DIAGNOSIS — Z9889 Other specified postprocedural states: Secondary | ICD-10-CM | POA: Diagnosis not present

## 2019-10-10 DIAGNOSIS — R1013 Epigastric pain: Secondary | ICD-10-CM

## 2019-10-10 NOTE — Patient Instructions (Signed)
If you are age 76 or older, your body mass index should be between 23-30. Your Body mass index is 23.06 kg/m. If this is out of the aforementioned range listed, please consider follow up with your Primary Care Provider.  If you are age 79 or younger, your body mass index should be between 19-25. Your Body mass index is 23.06 kg/m. If this is out of the aformentioned range listed, please consider follow up with your Primary Care Provider.   Please use peppermint oil or peppermint altoids. Follow up as needed so give [place] a call with any questions

## 2019-10-10 NOTE — Progress Notes (Signed)
P  Chief Complaint:    History of GERD, postoperative follow-up  GI History: Brittany Harper a 74 year old femalewith a longstanding history of refluxfor 20+ years, characterized by heartburn, regurgitation, epigastric discomfort, nausea, postprandial heartburn, now s/p Transoral Incisionless Fundoplication (TIF) in 71/0626 with resolution of reflux symptoms.  Prior to TIF, had trialed multiple H2RA and PPI medications in the past,starting withOmeprazole for 12 years, then eventually lost efficacy. Has also trialed ranitidine (no improvement), Tums, Pepcid. Retrialed Prilosec with improvement, but again eventual loss of efficacy.   GES was normal through 3 hours, with mildly delayed emptying at 85% (normal>90%) at 4 hours.  GERD history: -Index symptoms:Heartburn, regurgitation, epigastric discomfort, nausea -Medications trialed:Prilosec, Tums, Pepcid -Current medications:Pepcid 10 mg bid -Complications:Erosive esophagitis -GERD-HRQLscore: 33/50 (on medications, pre-TIF)  GERD evaluation: -EGD with TIF (10/16/2018, Dr. Bryan Lemma): LA Grade B esophagitis,, Hill grade 2 valve, 2 cm HH. Successful TIF with 24 fasteners placed. Gastric ulcer -EGD(6/20, Dr. Loletha Carrow): Grade 2 esophagitis, patent Schatzki's ring, 2 to 3 cm HH, fundic gland polyps - Prior EGD in [place] approx 12 years ago also with EE per patient  -Cologuard negative in 2020  HPI:     Patient is a 74 y.o. female presenting to the [facility] for follow-up.  Last seen by me on 02/08/2019 for postoperative follow-up.  States she has complete resolution of reflux.  No longer taking any acid suppression therapy.  Still with intermittent right ear pain, but well controlled with hemp oil. No dysphagia. Occasional dyspepsia, but relieves with crackers and very rarely pepcid. No change with FD Guard trial.    Review of systems:     No chest pain, no SOB, no fevers, no urinary sx   Past Medical  History:  Diagnosis Date  . Allergy   . Arthritis   . Asthma   . Cancer ([place])    basal cell  . GERD (gastroesophageal reflux disease)   . Hyperlipidemia     Patient's surgical history, family medical history, social history, medications and allergies were all reviewed in Epic    Current Outpatient Medications  Medication Sig Dispense Refill  . AMBULATORY NON FORMULARY MEDICATION Take 1 tablet by mouth 2 (two) times daily. Viviscal otc supplement    . AMBULATORY NON FORMULARY MEDICATION Hempvanna lotion    . Azelaic Acid 15 % cream After skin is thoroughly washed and patted dry, gently but thoroughly massage a thin film of azelaic acid cream into the affected area twice daily, in the morning and evening. (Patient taking differently: Apply 1 application topically daily. After skin is thoroughly washed and patted dry, gently but thoroughly massage a thin film of azelaic acid cream into the affected area) 150 g 3  . Calcium Carbonate-Vit D-Min (CALCIUM 1200) 1200-1000 MG-UNIT CHEW Chew 2 tablets by mouth daily.    . fexofenadine (ALLEGRA) 180 MG tablet Take 180 mg by mouth daily.    . hypromellose (SYSTANE OVERNIGHT THERAPY) 0.3 % GEL ophthalmic ointment Place 1 application into both eyes at bedtime.    Marland Kitchen ibuprofen (ADVIL) 200 MG tablet Take 200 mg by mouth every 8 (eight) hours as needed for headache or moderate pain.    . metroNIDAZOLE (METROGEL) 0.75 % gel Apply 1 application topically 2 (two) times daily. (Patient taking differently: Apply 1 application topically at bedtime. ) 45 g 0  . Misc Natural Products (OSTEO BI-FLEX ADV JOINT SHIELD PO) Take 2 tablets by mouth daily with lunch.     . Multiple Vitamins-Minerals (CENTRUM  SILVER ULTRA WOMENS PO) Take 1 tablet by mouth daily at 12 noon.     Brittany Harper Glycol-Propyl Glycol (SYSTANE OP) Place 1 drop into both eyes daily.    Marland Kitchen PREMARIN vaginal cream PLACE 1 APPLICATORFUL VAGINALLY ONCE A WEEK. (Patient taking differently: Place 1  Applicatorful vaginally 2 (two) times a week. ) 30 g 11  . simvastatin (ZOCOR) 5 MG tablet TAKE 1 TABLET BY MOUTH EVERY DAY 90 tablet 3   No current facility-administered medications for this visit.    Physical Exam:     BP 126/82   Pulse 95   Ht 5' 3.5" (1.613 m)   Wt 132 lb 4 oz (60 kg)   BMI 23.06 kg/m   GENERAL:  Pleasant female in NAD PSYCH: : Cooperative, normal affect NEURO: Alert and oriented x 3, no focal neurologic deficits   IMPRESSION and PLAN:    1) History of GERD 2) History of fundoplication (TIF) -Reflux symptoms well controlled/resolved since TIF -Tolerating all p.o. intake  3) Dyspepsia -No appreciable response to trial of FD guard -Can trial OTC peppermint oil -If no improvement, H. pylori stool antigen test  4) CRC screening -Cologuard completed in 2020 and was negative -Can discuss repeat screening modalities in 2023  RTC prn          Brittany Harper V Brittany Harper ,DO, FACG 10/10/2019, 1:59 PM

## 2019-12-02 ENCOUNTER — Other Ambulatory Visit: Payer: Self-pay | Admitting: Internal Medicine

## 2020-03-10 DIAGNOSIS — H524 Presbyopia: Secondary | ICD-10-CM | POA: Diagnosis not present

## 2020-03-10 DIAGNOSIS — H04123 Dry eye syndrome of bilateral lacrimal glands: Secondary | ICD-10-CM | POA: Diagnosis not present

## 2020-03-10 DIAGNOSIS — H26493 Other secondary cataract, bilateral: Secondary | ICD-10-CM | POA: Diagnosis not present

## 2020-03-10 DIAGNOSIS — H18513 Endothelial corneal dystrophy, bilateral: Secondary | ICD-10-CM | POA: Diagnosis not present

## 2020-03-12 ENCOUNTER — Other Ambulatory Visit: Payer: Self-pay | Admitting: Internal Medicine

## 2020-03-17 ENCOUNTER — Encounter: Payer: Medicare PPO | Admitting: Internal Medicine

## 2020-03-31 ENCOUNTER — Other Ambulatory Visit: Payer: Self-pay

## 2020-03-31 ENCOUNTER — Other Ambulatory Visit (HOSPITAL_BASED_OUTPATIENT_CLINIC_OR_DEPARTMENT_OTHER)
Admission: RE | Admit: 2020-03-31 | Discharge: 2020-03-31 | Disposition: A | Discharge status: Home / Self Care | Payer: Medicare PPO | Source: Ambulatory Visit | Attending: Nurse Practitioner | Admitting: Nurse Practitioner

## 2020-03-31 ENCOUNTER — Ambulatory Visit (HOSPITAL_BASED_OUTPATIENT_CLINIC_OR_DEPARTMENT_OTHER): Payer: Medicare PPO | Admitting: Nurse Practitioner

## 2020-03-31 ENCOUNTER — Encounter (HOSPITAL_BASED_OUTPATIENT_CLINIC_OR_DEPARTMENT_OTHER): Payer: Self-pay | Admitting: Nurse Practitioner

## 2020-03-31 VITALS — BP 131/67 | HR 62 | Ht 64.0 in | Wt 135.8 lb

## 2020-03-31 DIAGNOSIS — E782 Mixed hyperlipidemia: Secondary | ICD-10-CM

## 2020-03-31 DIAGNOSIS — K219 Gastro-esophageal reflux disease without esophagitis: Secondary | ICD-10-CM | POA: Diagnosis not present

## 2020-03-31 DIAGNOSIS — Z7689 Persons encountering health services in other specified circumstances: Secondary | ICD-10-CM | POA: Insufficient documentation

## 2020-03-31 DIAGNOSIS — Z780 Asymptomatic menopausal state: Secondary | ICD-10-CM | POA: Diagnosis not present

## 2020-03-31 DIAGNOSIS — Z13820 Encounter for screening for osteoporosis: Secondary | ICD-10-CM

## 2020-03-31 DIAGNOSIS — Z85828 Personal history of other malignant neoplasm of skin: Secondary | ICD-10-CM | POA: Diagnosis not present

## 2020-03-31 DIAGNOSIS — Z1231 Encounter for screening mammogram for malignant neoplasm of breast: Secondary | ICD-10-CM

## 2020-03-31 DIAGNOSIS — Z9889 Other specified postprocedural states: Secondary | ICD-10-CM | POA: Diagnosis not present

## 2020-03-31 DIAGNOSIS — N952 Postmenopausal atrophic vaginitis: Secondary | ICD-10-CM | POA: Diagnosis not present

## 2020-03-31 LAB — COMPREHENSIVE METABOLIC PANEL
ALT: 13 U/L (ref 0–44)
AST: 15 U/L (ref 15–41)
Albumin: 4.5 g/dL (ref 3.5–5.0)
Alkaline Phosphatase: 55 U/L (ref 38–126)
Anion gap: 7 (ref 5–15)
BUN: 14 mg/dL (ref 8–23)
CO2: 32 mmol/L (ref 22–32)
Calcium: 9.6 mg/dL (ref 8.9–10.3)
Chloride: 104 mmol/L (ref 98–111)
Creatinine, Ser: 0.8 mg/dL (ref 0.44–1.00)
GFR, Estimated: >60 mL/min (ref >60)
Glucose, Bld: 95 mg/dL (ref 70–99)
Potassium: 4.2 mmol/L (ref 3.5–5.1)
Sodium: 143 mmol/L (ref 135–145)
Total Bilirubin: 0.4 mg/dL (ref 0.3–1.2)
Total Protein: 6.8 g/dL (ref 6.5–8.1)

## 2020-03-31 LAB — CBC WITH DIFFERENTIAL/PLATELET
Abs Immature Granulocytes: 0.01 10*3/uL (ref 0.00–0.07)
Basophils Absolute: 0 10*3/uL (ref 0.0–0.1)
Basophils Relative: 1 %
Eosinophils Absolute: 0.2 10*3/uL (ref 0.0–0.5)
Eosinophils Relative: 2 %
HCT: 44.4 % (ref 36.0–46.0)
Hemoglobin: 14.5 g/dL (ref 12.0–15.0)
Immature Granulocytes: 0 %
Lymphocytes Relative: 26 %
Lymphs Abs: 1.7 10*3/uL (ref 0.7–4.0)
MCH: 30.3 pg (ref 26.0–34.0)
MCHC: 32.7 g/dL (ref 30.0–36.0)
MCV: 92.9 fL (ref 80.0–100.0)
Monocytes Absolute: 0.7 10*3/uL (ref 0.1–1.0)
Monocytes Relative: 10 %
Neutro Abs: 4 10*3/uL (ref 1.7–7.7)
Neutrophils Relative %: 61 %
Platelets: 238 10*3/uL (ref 150–400)
RBC: 4.78 MIL/uL (ref 3.87–5.11)
RDW: 12.4 % (ref 11.5–15.5)
WBC: 6.6 10*3/uL (ref 4.0–10.5)
nRBC: 0 % (ref 0.0–0.2)

## 2020-03-31 LAB — LIPID PANEL
Cholesterol: 182 mg/dL (ref 0–200)
HDL: 67 mg/dL (ref >40)
LDL Cholesterol: 93 mg/dL (ref 0–99)
Total CHOL/HDL Ratio: 2.7 RATIO
Triglycerides: 108 mg/dL (ref <150)
VLDL: 22 mg/dL (ref 0–40)

## 2020-03-31 MED ORDER — SIMVASTATIN 5 MG PO TABS: 5.0000 mg | ORAL_TABLET | Freq: Every day | ORAL | 3 refills | Status: DC

## 2020-03-31 MED ORDER — PREMARIN 0.625 MG/GM VA CREA: TOPICAL_CREAM | VAGINAL | 11 refills | Status: DC

## 2020-03-31 MED ORDER — OMEPRAZOLE 40 MG PO CPDR
40.0000 mg | DELAYED_RELEASE_CAPSULE | Freq: Every day | ORAL | 3 refills | Status: DC
Start: 2020-03-31 — End: 2021-04-06

## 2020-03-31 NOTE — Assessment & Plan Note (Signed)
Reports continued symptom of nerve pain after procedure within the esophagus causing intermitted cough.  She does not report being bothered much by this symptom, but if symptoms worsen or change may need to consider medication to help with management.  Recommend follow-up if symptoms worsen or fail to improve.

## 2020-03-31 NOTE — Assessment & Plan Note (Signed)
History of hyperlipidemia with simvastatin 5mg  every other day use.  Refills provided today.  Labs ordered for evaluation in conjunction with new patient visit and upcoming medicare AWV.  Will make changes to plan of care as necessary based on lab results.

## 2020-03-31 NOTE — Patient Instructions (Addendum)
Recommendations from today:   Annual Medicare Wellness Exam:  I have ordered labs today for this- you can have those done today.  We will plan on a telephone wellness visit at your earliest convenience.   Next year we can just do the wellness exam at one time with no need for duplicate appointments. _______________________________________________________________________________  Thank you for choosing [facility] for your Primary Care needs. I am excited for the opportunity to partner with you to meet your health care goals. It was a pleasure meeting you today!  I am an Adult-Geriatric Nurse Practitioner with a background in caring for patients for more than 20 years. I received my Paediatric nurse in Nursing and my Doctor of Nursing Practice degrees at [employer]. I received additional fellowship training in primary care and sports medicine after receiving my doctorate degree. I provide primary care and sports medicine services to patients age 77 and older within this office. I am also a provider with the [facility] and the director of the APP Fellowship with [facility].  I am a [place] native, but have called the [place] area home for nearly 20 years and am proud to be a member of this community.   I am passionate about providing the best service to you through preventive medicine and supportive care. I consider you a part of the medical team and value your input. I work diligently to ensure that you are heard and your needs are met in a safe and effective manner. I want you to feel comfortable with me as your provider and want you to know that your health concerns are important to me.   For your information, our office hours are Monday- Friday 8:00 AM - 5:00 PM At this time I am not in the office on Wednesdays.  If you have questions or concerns, please call our office at [phone] or send [place] a MyChart  message and we will respond as quickly as possible.   For all urgent or time sensitive needs we ask that you please call the office to avoid delays. MyChart is not constantly monitored and replies may take up to 72 business hours.  MyChart Policy: . MyChart allows for you to see your visit notes, after visit summary, provider recommendations, lab and tests results, make an appointment, request refills, and contact your provider or the office for non-urgent questions or concerns.  . Providers are seeing patients during normal business hours and do not have built in time to review MyChart messages. We ask that you allow a minimum of 72 business hours for MyChart message responses.  . Complex MyChart concerns may require a visit. Your provider may request you schedule a virtual or in person visit to ensure we are providing the best care possible. . MyChart messages sent after 4:00 PM on Friday will not be received by the provider until Monday morning.    Lab and Test Results: . You will receive your lab and test results on MyChart as soon as they are completed and results have been sent by the lab or testing facility. Due to this service, you will receive your results BEFORE your provider.  . Please allow a minimum of 72 business hours for your provider to receive and review lab and test results and contact you about.   . Most lab and test result comments from the provider will be sent through [facility]. Your provider may recommend changes to  the plan of care, follow-up visits, repeat testing, ask questions, or request an office visit to discuss these results. You may reply directly to this message or call the office at [phone] to provide information for the provider or set up an appointment. . In some instances, you will be called with test results and recommendations. Please let us know if this is preferred and we will make note of this in your chart to provide this for you.    . If you have not  heard a response to your lab or test results in 72 business hours, please call the office to let us know.   After Hours: . For all non-emergency after hours needs, please call the office at [phone] and select the option to reach the on-call provider service. On-call services are shared between multiple [facility] offices and therefore it will not be possible to speak directly with your provider. On-call providers may provide medical advice and recommendations, but are unable to provide refills for maintenance medications.  . For all emergency or urgent medical needs after normal business hours, we recommend that you seek care at the closest Urgent Care or Emergency Department to ensure appropriate treatment in a timely manner.  [facility] has a 24 hour emergency room located on the ground floor for your convenience.    Please do not hesitate to reach out to [place] with concerns.   Thank you, again, for choosing me as your health care partner. I appreciate your trust and look forward to learning more about you.   Worthy Keeler, DNP, AGNP-c _____________________________________________________________________________________  Health Maintenance Recommendations Screening Testing  Mammogram  Every 1 -2 years based on history and risk factors  Starting at age 77  Pap Smear  Ages 21-39 every 3 years  Ages 45-65 every 5 years with HPV testing  More frequent testing may be required based on results and history  Colon Cancer Screening  Every 1-10 years based on test performed, risk factors, and history  Starting at age 77  Bone Density Screening  Every 2-10 years based on history  Starting at age 77 for women  Recommendations for men differ based on medication usage, history, and risk factors  AAA Screening  One time ultrasound  Men 23-76 years old who have every smoked  Lung Cancer Screening  Low Dose Lung CT every 12 months  Age 68-80  years with a 30 pack-year smoking history who still smoke or who have quit within the last 15 years  Screening Labs  Routine  Labs: Complete Blood Count (CBC), Complete Metabolic Panel (CMP), Cholesterol (Lipid Panel)  Every 6-12 months based on history and medications  May be recommended more frequently based on current conditions or previous results  Hemoglobin A1c Lab  Every 3-12 months based on history and previous results  Starting at age 89 or earlier with diagnosis of diabetes, high cholesterol, BMI >26, and/or risk factors  Frequent monitoring for patients with diabetes to ensure blood sugar control  Thyroid Panel (TSH w/ T3 & T4)  Every 6 months based on history, symptoms, and risk factors  May be repeated more often if on medication  HIV  One time testing for all patients 24 and older  May be repeated more frequently for patients with increased risk factors or exposure  Hepatitis C  One time testing for all patients 9 and older  May be repeated more frequently for patients with increased risk factors or exposure  Gonorrhea,  Chlamydia  Every 12 months for all sexually active persons 13-24 years  Additional monitoring may be recommended for those who are considered high risk or who have symptoms  PSA  Men 78-32 years old with risk factors  Additional screening may be recommended from age 101-69 based on risk factors, symptoms, and history  Vaccine Recommendations  Tetanus Booster  All adults every 10 years  Flu Vaccine  All patients 6 months and older every year  COVID Vaccine  All patients 12 years and older  Initial dosing with booster  May recommend additional booster based on age and health history  HPV Vaccine  2 doses all patients age 63-26  Dosing may be considered for patients over 26  Shingles Vaccine (Shingrix)  2 doses all adults 64 years and older  Pneumonia (Pneumovax 23)  All adults 20 years and older  May recommend  earlier dosing based on health history  Pneumonia (Prevnar 52)  All adults 86 years and older  Dosed 1 year after Pneumovax 23  Additional Screening, Testing, and Vaccinations may be recommended on an individualized basis based on family history, health history, risk factors, and/or exposure.   _________________________________________________________________________________ Activity:  I recommend that all adults get at least 20 minutes of moderate physical activity that elevates your heart rate at least 5 days out of the week.  Some examples include: . Walking or jogging at a pace that allows you to carry on a conversation . Cycling (stationary bike or outdoors) . Water aerobics . Yoga . Weight lifting . Dancing If physical limitations prevent you from putting stress on your joints, exercise in a pool or seated in a chair are excellent options.  Do determine your MAXIMUM heart rate for activity: YOUR AGE - 220 = MAX HeartRate   Remember! . Do not push yourself too hard.  . Start slowly and build up your pace, speed, weight, time in exercise, etc.  . Allow your body to rest between exercise and get good sleep. . You will need more water than normal when you are exerting yourself. Do not wait until you are thirsty to drink. Drink with a purpose of getting in at least 8, 8 ounce glasses of water a day plus more depending on how much you exercise and sweat.    If you begin to develop dizziness, chest pain, abdominal pain, jaw pain, shortness of breath, headache, vision changes, lightheadedness, or other concerning symptoms, stop the activity and allow your body to rest. If your symptoms are severe, seek emergency evaluation immediately. If your symptoms are concerning, but not severe, please let us know so that we can recommend further evaluation.  _______________________________________________________________________________  Diet:  I recommend that all patients maintain a diet  low in saturated fats, carbohydrates, and cholesterol. While this can be challenging at first, it is not impossible and small changes can make big differences.  Things to try: Marland Kitchen Decreasing the amount of soda, sweet tea, and/or juice to one or less per day and replace with water o While water is always the first choice, if you do not like water you may consider - adding a water additive without sugar to improve the taste - other sugar free drinks . Replace potatoes with a brightly colored vegetable at dinner . Use healthy oils, such as canola oil or olive oil, instead of butter or hard margarine . Limit your bread intake to two pieces or less a day . Replace regular pasta with low carb pasta options .  Bake, broil, or grill foods instead of frying . Monitor portion sizes  . Eat smaller, more frequent meals throughout the day instead of large meals  An important thing to remember is, if you love foods that are not great for your health, you don't have to give them up completely. Instead, allow these foods to be a reward when you have done well. Allowing yourself to still have special treats every once in a while is a nice way to tell yourself thank you for working hard to keep yourself healthy.   Also remember that every day is a new day. If you have a bad day and "fall off the wagon", you can still climb right back up and keep moving along on your journey!  We have resources available to help you!  Some websites that may be helpful include: . www.http://carter.biz/  . Www.VeryWellFit.com _____________________________________________________________________________________

## 2020-03-31 NOTE — Assessment & Plan Note (Addendum)
Night time symptoms of epigastric tenderness and reflux with history of hernia repair and esophageal ulcer.  Discussed the option to restart omeprazole for daily use to control symptoms and prevent gastritis and recurrent ulcer formation.  Recommend taking this medication at the opposite time of day as calcium supplement to prevent blocking of absorption.  Recommend avoid foods high in fat, spices, and carbonation to prevent worsening of symptoms.  Patient to follow-up if symptoms are not relieved by medication.

## 2020-03-31 NOTE — Progress Notes (Signed)
New Patient Office Visit  Subjective:  Patient ID: Brittany Harper, female    DOB: Jan 16, 1945  Age: 75 y.o. MRN: [id number]  CC:  Chief Complaint  Patient presents with  . Establish Care  . Medication Refill    HPI Brittany Harper is a pleasant 75 year old female presenting today to establish care with this practice. She is switching providers for increased convenience of access. She is an overall healthy female who resides at [facility]. 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 She and her husband were married for more than 30 years prior to his passing several years ago. She has 2 adult children and 2 step children. Her children do not live in the immediate area, but she has frequent contact with them. She endorses active involvement in the retirement community and reports that she feels safe in her environment.   Her previous PCP was Dr. Pricilla Holm with [facility]. She has been seen by Dr. Foy Guadalajara with [facility], but is only seen on an as needed basis at this time. She sees Dr. Zackery Barefoot for dermatology.   She reports an overall healthy diet and active lifestyle. She eats from the community dining room 2-3 times a week and walks 1-2 miles every day. She drinks less than 2 alcoholic beverages per week and does not use nicotine containing products or recreational drugs.  She is not currently sexually active and denies any concerns with STI today. She is post menopausal and denies any current symptoms or concerns.  She denies changes to her bowel or bladder habits and reports that she has annual skin examinations by dermatology after having basal cell carcinoma removed many years ago.  She is due for her next mammogram in June of this year. Colonoscopy is no longer recommended unless she wishes to have this done, and she does not at this time.   She does have concerns today with increased gastric acid production and  symptoms that tend to be worse at night. She has a history of hernia repair and esophageal ulcer. She has been on omeprazole in the past which she reports was very helpful in eliminating her symptoms, but she has never taken these for greater than 2 weeks at a time. She is interested in restarting this today as a long term management for GERD symptoms. She denies vomiting, bloody emesis, dark/tarry stools, or difficulty swallowing.   She has an active DNR and Most Form and has brought this with her today for review.   Past Medical History:  Diagnosis Date  . Allergy   . Arthritis   . Asthma   . Cancer ([place])    basal cell  . Gastric ulcer without hemorrhage or perforation   . GERD (gastroesophageal reflux disease)   . Hiatal hernia with GERD and esophagitis 10/16/2018  . Hyperlipidemia   . Memory change 04/05/2016    Past Surgical History:  Procedure Laterality Date  . ABDOMINAL HYSTERECTOMY    . BIOPSY  10/16/2018   Procedure: BIOPSY;  Surgeon: Lavena Bullion, DO;  Location: [facility];  Service: Gastroenterology;;  . ESOPHAGOGASTRODUODENOSCOPY (EGD) WITH PROPOFOL N/A 10/16/2018   Procedure: ESOPHAGOGASTRODUODENOSCOPY (EGD) WITH PROPOFOL;  Surgeon: Lavena Bullion, DO;  Location: [facility];  Service: Gastroenterology;  Laterality: N/A;  . TRANSORAL INCISIONLESS FUNDOPLICATION N/A 40/98/1191   Procedure: TRANSORAL INCISIONLESS FUNDOPLICATION;  Surgeon: Lavena Bullion, DO;  Location: [facility];  Service: Gastroenterology;  Laterality: N/A;  Family History  Problem Relation Age of Onset  . Arthritis Mother   . Breast cancer Mother        breast and ovarian, and basal cell  . Ovarian cancer Mother   . Basal cell carcinoma Mother   . Arthritis Father   . Basal cell carcinoma Father        basal cell  . Diabetes Sister   . Diabetes Paternal Uncle   . Diabetes Paternal Grandmother   . Diabetes Paternal Grandfather   . Stomach cancer Paternal Aunt   . Colon  cancer Neg Hx   . Pancreatic cancer Neg Hx     Social History   Socioeconomic History  . Marital status: Widowed    Spouse name: Not on file  . Number of children: 4  . Years of education: Not on file  . Highest education level: Not on file  Occupational History  . Occupation: retired Pharmacist, hospital   Tobacco Use  . Smoking status: Never Smoker  . Smokeless tobacco: Never Used  Vaping Use  . Vaping Use: Never used  Substance and Sexual Activity  . Alcohol use: Not Currently    Alcohol/week: 0.0 standard drinks  . Drug use: No  . Sexual activity: Not on file  Other Topics Concern  . Not on file  Social History Narrative  . Not on file   Social Determinants of Health   Financial Resource Strain: Not on file  Food Insecurity: Not on file  Transportation Needs: Not on file  Physical Activity: Not on file  Stress: Not on file  Social Connections: Not on file  Intimate Partner Violence: Not on file    ROS Review of Systems  Constitutional: Negative for activity change, appetite change, fatigue, fever and unexpected weight change.  HENT: Negative for congestion, ear pain, postnasal drip, rhinorrhea, sinus pressure and sinus pain.   Eyes: Negative for visual disturbance.  Respiratory: Positive for cough. Negative for chest tightness, shortness of breath and wheezing.        Intermittent cough due to nerve damage after EGD.   Cardiovascular: Negative for chest pain, palpitations and leg swelling.  Gastrointestinal: Negative for abdominal pain, blood in stool, constipation, diarrhea and nausea.  Genitourinary: Negative for difficulty urinating, dysuria, frequency, hematuria, pelvic pain, urgency, vaginal bleeding, vaginal discharge and vaginal pain.  Musculoskeletal: Negative for arthralgias, back pain, gait problem and joint swelling.  Skin: Negative for color change, pallor, rash and wound.       Annual dermatology evaluation d/t hx of basal cell carcinoma >20 years ago.    Neurological: Negative for dizziness, syncope, weakness, light-headedness, numbness and headaches.  Psychiatric/Behavioral: Negative for agitation, decreased concentration, dysphoric mood and sleep disturbance. The patient is not nervous/anxious.     Objective:   Today's Vitals: BP 131/67   Pulse 62   Ht 5\' 4"  (1.626 m)   Wt 135 lb 12.8 oz (61.6 kg)   BMI 23.31 kg/m   Physical Exam Vitals and nursing note reviewed.  Constitutional:      Appearance: Normal appearance. She is normal weight.  HENT:     Head: Normocephalic and atraumatic.     Right Ear: Tympanic membrane, ear canal and external ear normal.     Left Ear: Tympanic membrane, ear canal and external ear normal.     Nose: Nose normal. No congestion.     Mouth/Throat:     Mouth: Mucous membranes are moist.     Pharynx: Oropharynx is clear. No posterior oropharyngeal  erythema.  Eyes:     Extraocular Movements: Extraocular movements intact.     Conjunctiva/sclera: Conjunctivae normal.     Pupils: Pupils are equal, round, and reactive to light.  Neck:     Vascular: No carotid bruit.  Cardiovascular:     Rate and Rhythm: Normal rate and regular rhythm.     Pulses: Normal pulses.     Heart sounds: Normal heart sounds. No murmur heard.   Pulmonary:     Effort: Pulmonary effort is normal.     Breath sounds: Normal breath sounds. No wheezing.  Abdominal:     General: Bowel sounds are normal. There is no distension.     Palpations: Abdomen is soft.     Tenderness: There is abdominal tenderness in the epigastric area. There is no right CVA tenderness, left CVA tenderness or guarding.     Comments: Tenderness on palpation, consistent with GERD symptoms mentioned.   Musculoskeletal:        General: Normal range of motion.     Cervical back: Normal range of motion and neck supple. No rigidity.     Right lower leg: No edema.     Left lower leg: No edema.  Lymphadenopathy:     Cervical: No cervical adenopathy.  Skin:     General: Skin is warm and dry.     Capillary Refill: Capillary refill takes less than 2 seconds.  Neurological:     General: No focal deficit present.     Mental Status: She is alert and oriented to person, place, and time.     Sensory: No sensory deficit.     Motor: No weakness.     Coordination: Coordination normal.     Gait: Gait normal.  Psychiatric:        Mood and Affect: Mood normal.        Behavior: Behavior normal.        Thought Content: Thought content normal.        Judgment: Judgment normal.     Assessment & Plan:   Problem List Items Addressed This Visit      Digestive   Gastroesophageal reflux disease with esophagitis    Night time symptoms of epigastric tenderness and reflux with history of hernia repair and esophageal ulcer.  Discussed the option to restart omeprazole for daily use to control symptoms and prevent gastritis and recurrent ulcer formation.  Recommend taking this medication at the opposite time of day as calcium supplement to prevent blocking of absorption.         Relevant Medications   omeprazole (PRILOSEC) 40 MG capsule     Musculoskeletal and Integument   Hx of basal cell carcinoma     Other   Postmenopausal   Hyperlipemia   Relevant Medications   simvastatin (ZOCOR) 5 MG tablet   Other Relevant Orders   CBC with Differential (Completed)   Comprehensive metabolic panel (Completed)   Lipid panel   History of fundoplication    Other Visit Diagnoses    Encounter to establish care    -  Primary   Relevant Medications   omeprazole (PRILOSEC) 40 MG capsule   Other Relevant Orders   CBC with Differential (Completed)   Comprehensive metabolic panel (Completed)   Lipid panel   DG Bone Density   MM 3D SCREEN BREAST BILATERAL   Screening for osteoporosis       Relevant Orders   DG Bone Density   Encounter for screening mammogram for malignant neoplasm of breast  Relevant Orders   MM 3D SCREEN BREAST BILATERAL   Post-menopause  atrophic vaginitis       Relevant Medications   conjugated estrogens (PREMARIN) vaginal cream      Outpatient Encounter Medications as of 03/31/2020  Medication Sig  . AMBULATORY NON FORMULARY MEDICATION Take 1 tablet by mouth 2 (two) times daily. Viviscal otc supplement  . AMBULATORY NON FORMULARY MEDICATION Hempvanna lotion  . Azelaic Acid 15 % cream After skin is thoroughly washed and patted dry, gently but thoroughly massage a thin film of azelaic acid cream into the affected area twice daily, in the morning and evening. (Patient taking differently: Apply 1 application topically daily. After skin is thoroughly washed and patted dry, gently but thoroughly massage a thin film of azelaic acid cream into the affected area)  . Calcium Carbonate-Vit D-Min (CALCIUM 1200) 1200-1000 MG-UNIT CHEW Chew 2 tablets by mouth daily.  . fexofenadine (ALLEGRA) 180 MG tablet Take 180 mg by mouth daily.  . hypromellose (SYSTANE OVERNIGHT THERAPY) 0.3 % GEL ophthalmic ointment Place 1 application into both eyes at bedtime.  Marland Kitchen ibuprofen (ADVIL) 200 MG tablet Take 200 mg by mouth every 8 (eight) hours as needed for headache or moderate pain.  . metroNIDAZOLE (METROGEL) 0.75 % gel Apply 1 application topically 2 (two) times daily. (Patient taking differently: Apply 1 application topically at bedtime.)  . Misc Natural Products (OSTEO BI-FLEX ADV JOINT SHIELD PO) Take 2 tablets by mouth daily with lunch.   . Multiple Vitamins-Minerals (CENTRUM SILVER ULTRA WOMENS PO) Take 1 tablet by mouth daily at 12 noon.   Marland Kitchen omeprazole (PRILOSEC) 40 MG capsule Take 1 capsule (40 mg total) by mouth daily.  Vladimir Faster Glycol-Propyl Glycol (SYSTANE OP) Place 1 drop into both eyes daily.  . [DISCONTINUED] PREMARIN vaginal cream PLACE 1 APPLICATORFUL VAGINALLY ONCE A WEEK.  . [DISCONTINUED] simvastatin (ZOCOR) 5 MG tablet TAKE 1 TABLET BY MOUTH EVERY DAY  . conjugated estrogens (PREMARIN) vaginal cream PLACE 1 APPLICATORFUL VAGINALLY  ONCE A WEEK.  . simvastatin (ZOCOR) 5 MG tablet Take 1 tablet (5 mg total) by mouth daily.   No facility-administered encounter medications on file as of 03/31/2020.   Time: 65 minutes, >50% spent counseling, care coordination, chart review, and documentation.    Follow-up: Return for Lab Appt today- Medicare AWV telephone-earliest convenience.   [name], NP

## 2020-03-31 NOTE — Assessment & Plan Note (Signed)
No changes to the skin that have been noted by patient since last dermatology check.  Continue with dermatology evaluation annually.  Recommend follow-up with me or dermatology if any changes present.

## 2020-03-31 NOTE — Assessment & Plan Note (Signed)
No new symptoms present with excellent results with weekly premarin cream.  Continue premarin. Refills provided today.  Follow-up if new symptoms or concerns develop.

## 2020-04-03 NOTE — Progress Notes (Signed)
Labs look terrific. No changes.

## 2020-04-04 ENCOUNTER — Other Ambulatory Visit: Payer: Self-pay

## 2020-04-04 ENCOUNTER — Encounter (HOSPITAL_BASED_OUTPATIENT_CLINIC_OR_DEPARTMENT_OTHER): Payer: Self-pay | Admitting: Nurse Practitioner

## 2020-04-04 ENCOUNTER — Telehealth (INDEPENDENT_AMBULATORY_CARE_PROVIDER_SITE_OTHER): Payer: Medicare PPO | Admitting: Nurse Practitioner

## 2020-04-04 DIAGNOSIS — Z0000 Encounter for general adult medical examination without abnormal findings: Secondary | ICD-10-CM | POA: Diagnosis not present

## 2020-04-04 DIAGNOSIS — L719 Rosacea, unspecified: Secondary | ICD-10-CM | POA: Diagnosis not present

## 2020-04-04 DIAGNOSIS — K2100 Gastro-esophageal reflux disease with esophagitis, without bleeding: Secondary | ICD-10-CM | POA: Diagnosis not present

## 2020-04-04 DIAGNOSIS — E782 Mixed hyperlipidemia: Secondary | ICD-10-CM | POA: Diagnosis not present

## 2020-04-04 DIAGNOSIS — Z85828 Personal history of other malignant neoplasm of skin: Secondary | ICD-10-CM

## 2020-04-04 DIAGNOSIS — M8580 Other specified disorders of bone density and structure, unspecified site: Secondary | ICD-10-CM | POA: Insufficient documentation

## 2020-04-04 DIAGNOSIS — Z780 Asymptomatic menopausal state: Secondary | ICD-10-CM

## 2020-04-04 NOTE — Progress Notes (Signed)
Patient spoke with provider during AWV today to discuss results.

## 2020-04-04 NOTE — Patient Instructions (Addendum)
Medicare Annual Wellness Exam 04/04/2020 Next Due: 04/04/2021  Goal for the next year:   Work to drink 45 ounces of water a day by using multiple water bottles throughout the home to reminder and convenience.    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  Future health maintenance recommendations include:  Mammogram- scheduled 06/09/2020  Every 1 -2 years based on history and risk factors  Starting at age 36  Bone Density Screening-  Scheduled 06/09/2020  Every 2-10 years based on history  Starting at age 62 for women  Cervical Cancer Screening- no longer required due to age  Ages 21-39 every 3 years  Ages 39-65 every 5 years with HPV testing  More frequent testing may be required based on results and history  Colon Cancer Screening- no longer required due to age  Every 1-10 years based on test performed, risk factors, and history  Starting at age 65  Screening Labs   Complete Blood Count (CBC), Complete Metabolic Panel (CMP), Cholesterol (Lipid Panel)  Every 6-12 months based on history and medications  May be recommended more frequently based on current conditions or previous results  Completed 03/2020  Hemoglobin A1c Lab  Every 3-12 months based on history and previous results  Starting at age 47 or earlier with diagnosis of diabetes, high cholesterol, BMI >26, and/or risk factors  Frequent monitoring for patients with diabetes to ensure blood sugar control  No risk factors at this time.  Thyroid Panel (TSH w/ T3 & T4)  Every 6 months based on history, symptoms, and risk factors  May be repeated more often if on medication  No risk factors at this time.   HIV  One time testing for all patients 13 and older  May be repeated more  frequently for patients with increased risk factors or exposure  Completed  Hepatitis C  One time testing for all patients 22 and older  May be repeated more frequently for patients with increased risk factors or exposure  Completed  Gonorrhea, Chlamydia  Every 12 months for all sexually active persons 13-24 years  Additional monitoring may be recommended for those who are considered high risk or who have symptoms  Not applicable  Vaccine Recommendations  Tetanus Booster  All adults every 10 years  Next due 2027  Flu Vaccine  All patients 6 months and older every year  Next due fall 2022  COVID Vaccine  All patients 12 years and older  Initial dosing with booster  May recommend additional booster based on age and health history  Completed   Shingles Vaccine (Shingrix)  2 doses all adults 72 years and older  Completed  Pneumonia (Pneumovax 23)  All adults 108 years and older  May recommend earlier dosing based on health history  Completed  Pneumonia (Prevnar 13)  All adults 4 years and older  Dosed 1 year after Pneumovax 23  Completed  Additional Screening, Testing, and Vaccinations may be recommended on an individualized basis based on family history, health history, risk factors, and/or exposure.     Bone Density Test A bone density test uses a type of X-ray to measure the amount of calcium and other minerals in a person's bones. It can measure bone density in the hip and the spine. The test is similar to having a regular X-ray. This test may also  be called:  Bone densitometry.  Bone mineral density test.  Dual-energy X-ray absorptiometry (DEXA). You may have this test to:  Diagnose a condition that causes weak or thin bones (osteoporosis).  Screen you for osteoporosis.  Predict your risk for a broken bone (fracture).  Determine how well your osteoporosis treatment is working. Tell a health care provider about:  Any allergies  you have.  All medicines you are taking, including vitamins, herbs, eye drops, creams, and over-the-counter medicines.  Any problems you or family members have had with anesthetic medicines.  Any blood disorders you have.  Any surgeries you have had.  Any medical conditions you have.  Whether you are pregnant or may be pregnant.  Any medical tests you have had within the past 14 days that used contrast material. What are the risks? Generally, this is a safe test. However, it does expose you to a small amount of radiation, which can slightly increase your cancer risk. What happens before the test?  Do not take any calcium supplements within the 24 hours before your test.  You will need to remove all metal jewelry, eyeglasses, removable dental appliances, and any other metal objects on your body. What happens during the test?  You will lie down on an exam table. There will be an X-ray generator below you and an imaging device above you.  Other devices, such as boxes or braces, may be used to position your body properly for the scan.  The machine will slowly scan your body. You will need to keep very still while the machine does the scan.  The images will show up on a screen in the room. Images will be examined by a specialist after your test is finished. The procedure may vary among health care providers and hospitals.   What can I expect after the test? It is up to you to get the results of your test. Ask your health care provider, or the department that is doing the test, when your results will be ready. Summary  A bone density test is an imaging test that uses a type of X-ray to measure the amount of calcium and other minerals in your bones.  The test may be used to diagnose or screen you for a condition that causes weak or thin bones (osteoporosis), predict your risk for a broken bone (fracture), or determine how well your osteoporosis treatment is working.  Do not take any  calcium supplements within 24 hours before your test.  Ask your health care provider, or the department that is doing the test, when your results will be ready. This information is not intended to replace advice given to you by your health care provider. Make sure you discuss any questions you have with your health care provider. Document Revised: 06/07/2019 Document Reviewed: 06/07/2019 Elsevier Patient Education  2021 [place] Prevention in the Home, Adult Falls can cause injuries and can happen to people of all ages. There are many things you can do to make your home safe and to help prevent falls. Ask for help when making these changes. What actions can I take to prevent falls? General Instructions  Use good lighting in all rooms. Replace any light bulbs that burn out.  Turn on the lights in dark areas. Use night-lights.  Keep items that you use often in easy-to-reach places. Lower the shelves around your home if needed.  Set up your furniture so you have a clear path. Avoid moving your furniture  around.  Do not have throw rugs or other things on the floor that can make you trip.  Avoid walking on wet floors.  If any of your floors are uneven, fix them.  Add color or contrast paint or tape to clearly mark and help you see: ? Grab bars or handrails. ? First and last steps of staircases. ? Where the edge of each step is.  If you use a stepladder: ? Make sure that it is fully opened. Do not climb a closed stepladder. ? Make sure the sides of the stepladder are locked in place. ? Ask someone to hold the stepladder while you use it.  Know where your pets are when moving through your home. What can I do in the bathroom?  Keep the floor dry. Clean up any water on the floor right away.  Remove soap buildup in the tub or shower.  Use nonskid mats or decals on the floor of the tub or shower.  Attach bath mats securely with double-sided, nonslip rug tape.  If you  need to sit down in the shower, use a plastic, nonslip stool.  Install grab bars by the toilet and in the tub and shower. Do not use towel bars as grab bars.      What can I do in the bedroom?  Make sure that you have a light by your bed that is easy to reach.  Do not use any sheets or blankets for your bed that hang to the floor.  Have a firm chair with side arms that you can use for support when you get dressed. What can I do in the kitchen?  Clean up any spills right away.  If you need to reach something above you, use a step stool with a grab bar.  Keep electrical cords out of the way.  Do not use floor polish or wax that makes floors slippery. What can I do with my stairs?  Do not leave any items on the stairs.  Make sure that you have a light switch at the top and the bottom of the stairs.  Make sure that there are handrails on both sides of the stairs. Fix handrails that are broken or loose.  Install nonslip stair treads on all your stairs.  Avoid having throw rugs at the top or bottom of the stairs.  Choose a carpet that does not hide the edge of the steps on the stairs.  Check carpeting to make sure that it is firmly attached to the stairs. Fix carpet that is loose or worn. What can I do on the outside of my home?  Use bright outdoor lighting.  Fix the edges of walkways and driveways and fix any cracks.  Remove anything that might make you trip as you walk through a door, such as a raised step or threshold.  Trim any bushes or trees on paths to your home.  Check to see if handrails are loose or broken and that both sides of all steps have handrails.  Install guardrails along the edges of any raised decks and porches.  Clear paths of anything that can make you trip, such as tools or rocks.  Have leaves, snow, or ice cleared regularly.  Use sand or salt on paths during winter.  Clean up any spills in your garage right away. This includes grease or oil  spills. What other actions can I take?  Wear shoes that: ? Have a low heel. Do not wear high heels. ?  Have rubber bottoms. ? Feel good on your feet and fit well. ? Are closed at the toe. Do not wear open-toe sandals.  Use tools that help you move around if needed. These include: ? Canes. ? Walkers. ? Scooters. ? Crutches.  Review your medicines with your doctor. Some medicines can make you feel dizzy. This can increase your chance of falling. Ask your doctor what else you can do to help prevent falls. Where to find more information  Centers for Disease Control and Prevention, [employer]: http://www.wolf.info/  National Institute on Aging: http://kim-miller.com/ Contact a doctor if:  You are afraid of falling at home.  You feel weak, drowsy, or dizzy at home.  You fall at home. Summary  There are many simple things that you can do to make your home safe and to help prevent falls.  Ways to make your home safe include removing things that can make you trip and installing grab bars in the bathroom.  Ask for help when making these changes in your home. This information is not intended to replace advice given to you by your health care provider. Make sure you discuss any questions you have with your health care provider. Document Revised: 07/25/2019 Document Reviewed: 07/25/2019 Elsevier Patient Education  [place] Maintenance, Female Adopting a healthy lifestyle and getting preventive care are important in promoting health and wellness. Ask your health care provider about:  The right schedule for you to have regular tests and exams.  Things you can do on your own to prevent diseases and keep yourself healthy. What should I know about diet, weight, and exercise? Eat a healthy diet  Eat a diet that includes plenty of vegetables, fruits, low-fat dairy products, and lean protein.  Do not eat a lot of foods that are high in solid fats, added sugars, or sodium.   Maintain a  healthy weight Body mass index (BMI) is used to identify weight problems. It estimates body fat based on height and weight. Your health care provider can help determine your BMI and help you achieve or maintain a healthy weight. Get regular exercise Get regular exercise. This is one of the most important things you can do for your health. Most adults should:  Exercise for at least 150 minutes each week. The exercise should increase your heart rate and make you sweat (moderate-intensity exercise).  Do strengthening exercises at least twice a week. This is in addition to the moderate-intensity exercise.  Spend less time sitting. Even light physical activity can be beneficial. Watch cholesterol and blood lipids Have your blood tested for lipids and cholesterol at 77 years of age, then have this test every 5 years. Have your cholesterol levels checked more often if:  Your lipid or cholesterol levels are high.  You are older than 77 years of age.  You are at high risk for heart disease. What should I know about cancer screening? Depending on your health history and family history, you may need to have cancer screening at various ages. This may include screening for:  Breast cancer.  Cervical cancer.  Colorectal cancer.  Skin cancer.  Lung cancer. What should I know about heart disease, diabetes, and high blood pressure? Blood pressure and heart disease  High blood pressure causes heart disease and increases the risk of stroke. This is more likely to develop in people who have high blood pressure readings, are of African descent, or are overweight.  Have your blood pressure checked: ? Every 3-5  years if you are 10-43 years of age. ? Every year if you are 5 years old or older. Diabetes Have regular diabetes screenings. This checks your fasting blood sugar level. Have the screening done:  Once every three years after age 32 if you are at a normal weight and have a low risk for  diabetes.  More often and at a younger age if you are overweight or have a high risk for diabetes. What should I know about preventing infection? Hepatitis B If you have a higher risk for hepatitis B, you should be screened for this virus. Talk with your health care provider to find out if you are at risk for hepatitis B infection. Hepatitis C Testing is recommended for:  Everyone born from [birth year] through 1967.  Anyone with known risk factors for hepatitis C. Sexually transmitted infections (STIs)  Get screened for STIs, including gonorrhea and chlamydia, if: ? You are sexually active and are younger than 76 years of age. ? You are older than 77 years of age and your health care provider tells you that you are at risk for this type of infection. ? Your sexual activity has changed since you were last screened, and you are at increased risk for chlamydia or gonorrhea. Ask your health care provider if you are at risk.  Ask your health care provider about whether you are at high risk for HIV. Your health care provider may recommend a prescription medicine to help prevent HIV infection. If you choose to take medicine to prevent HIV, you should first get tested for HIV. You should then be tested every 3 months for as long as you are taking the medicine. Pregnancy  If you are about to stop having your period (premenopausal) and you may become pregnant, seek counseling before you get pregnant.  Take 400 to 800 micrograms (mcg) of folic acid every day if you become pregnant.  Ask for birth control (contraception) if you want to prevent pregnancy. Osteoporosis and menopause Osteoporosis is a disease in which the bones lose minerals and strength with aging. This can result in bone fractures. If you are 59 years old or older, or if you are at risk for osteoporosis and fractures, ask your health care provider if you should:  Be screened for bone loss.  Take a calcium or vitamin D supplement to lower  your risk of fractures.  Be given hormone replacement therapy (HRT) to treat symptoms of menopause. Follow these instructions at home: Lifestyle  Do not use any products that contain nicotine or tobacco, such as cigarettes, e-cigarettes, and chewing tobacco. If you need help quitting, ask your health care provider.  Do not use street drugs.  Do not share needles.  Ask your health care provider for help if you need support or information about quitting drugs. Alcohol use  Do not drink alcohol if: ? Your health care provider tells you not to drink. ? You are pregnant, may be pregnant, or are planning to become pregnant.  If you drink alcohol: ? Limit how much you use to 0-1 drink a day. ? Limit intake if you are breastfeeding.  Be aware of how much alcohol is in your drink. In the [place] one drink equals one 12 oz bottle of beer (355 mL), one 5 oz glass of wine (148 mL), or one 1 oz glass of hard liquor (44 mL). General instructions  Schedule regular health, dental, and eye exams.  Stay current with your vaccines.  Tell your health  care provider if: ? You often feel depressed. ? You have ever been abused or do not feel safe at home. Summary  Adopting a healthy lifestyle and getting preventive care are important in promoting health and wellness.  Follow your health care provider's instructions about healthy diet, exercising, and getting tested or screened for diseases.  Follow your health care provider's instructions on monitoring your cholesterol and blood pressure. This information is not intended to replace advice given to you by your health care provider. Make sure you discuss any questions you have with your health care provider. Document Revised: 12/14/2017 Document Reviewed: 12/14/2017 Elsevier Patient Education  2021 [place] A mammogram is a low energy X-ray of the breasts that is done to check for abnormal changes. This procedure can screen for  and detect any changes that may indicate breast cancer. Mammograms are regularly done on women. A man may have a mammogram if he has a lump or swelling in his breast. A mammogram can also identify other changes and variations in the breast, such as:  Inflammation of the breast tissue (mastitis).  An infected area that contains a collection of pus (abscess).  A fluid-filled sac (cyst).  Fibrocystic changes. This is when breast tissue becomes denser, which can make the tissue feel rope-like or uneven under the skin.  Tumors that are not cancerous (benign). Tell a health care provider:  About any allergies you have.  If you have breast implants.  If you have had previous breast disease, biopsy, or surgery.  If you are breastfeeding.  If you are younger than age 47.  If you have a family history of breast cancer.  Whether you are pregnant or may be pregnant. What are the risks? Generally, this is a safe procedure. However, problems may occur, including:  Exposure to radiation. Radiation levels are very low with this test.  The results being misinterpreted.  The need for further tests.  The inability of the mammogram to detect certain cancers. What happens before the procedure?  Schedule your test about 1-2 weeks after your menstrual period if you are still menstruating. This is usually when your breasts are the least tender.  If you have had a mammogram done at a different facility in the past, get the mammogram X-rays or have them sent to your current exam facility. The new and old images will be compared.  Wash your breasts and underarms on the day of the test.  Do not wear deodorants, perfumes, lotions, or powders anywhere on your body on the day of the test.  Remove any jewelry from your neck.  Wear clothes that you can change into and out of easily. What happens during the procedure?  You will undress from the waist up and put on a gown that opens in the  front.  You will stand in front of the X-ray machine.  Each breast will be placed between two plastic or glass plates. The plates will compress your breast for a few seconds. Try to stay as relaxed as possible during the procedure. This does not cause any harm to your breasts and any discomfort you feel will be very brief.  X-rays will be taken from different angles of each breast. The procedure may vary among health care providers and hospitals.   What happens after the procedure?  The mammogram will be examined by a specialist (radiologist).  You may need to repeat certain parts of the test, depending on the quality of the  images. This is commonly done if the radiologist needs a better view of the breast tissue.  You may resume your normal activities.  It is up to you to get the results of your procedure. Ask your health care provider, or the department that is doing the procedure, when your results will be ready. Summary  A mammogram is a low energy X-ray of the breasts that is done to check for abnormal changes. A man may have a mammogram if he has a lump or swelling in his breast.  If you have had a mammogram done at a different facility in the past, get the mammogram X-rays or have them sent to your current exam facility in order to compare them.  Schedule your test about 1-2 weeks after your menstrual period if you are still menstruating.  For this test, each breast will be placed between two plastic or glass plates. The plates will compress your breast for a few seconds.  Ask when your test results will be ready. Make sure you get your test results. This information is not intended to replace advice given to you by your health care provider. Make sure you discuss any questions you have with your health care provider. Document Revised: 08/11/2017 Document Reviewed: 08/11/2017 Elsevier Patient Education  [place].

## 2020-04-04 NOTE — Progress Notes (Addendum)
Subjective:   Brittany Harper is a 75 y.o. female who presents for Medicare Annual (Subsequent) preventive examination.  Telephone Visit Disclaimer  This Medicare AWV was conducted by telephone due to national recommendations for restrictions regarding the COVID-19 Pandemic (e.g. social distancing).   I verified, using two identifiers, that I am speaking with Brittany Harper, or their authorized healthcare agent.  I discussed the limitations, risks, security, and privacy concerns of performing an evaluation and management service by telephone and the potential availability of an in-person appointment in the future.  The patient expressed understanding and agreed to proceed.  Location of Patient: Home  Location of Provider (nurse):  In the office.     Review of Systems    All ROS negative  Cardiac Risk Factors include: advanced age (>52men, >32 women);dyslipidemia     Objective:    There were no vitals filed for this visit. There is no height or weight on file to calculate BMI.  Most recent BMI 03/31/2020 23.31 with weight of 135lbs  Advanced Directives 04/04/2020 10/16/2018 10/16/2018  Does Patient Have a Medical Advance Directive? Yes Yes Yes  Type of Advance Directive Living will;Out of facility DNR (pink MOST or yellow form);Healthcare Power of [place];Living will Avis;Living will  Does patient want to make changes to medical advance directive? Yes (MAU/Ambulatory/Procedural Areas - Information given) No - Patient declined -  Copy of [facility] in Chart? No - copy requested No - copy requested No - copy requested    Current Medications (verified) Outpatient Encounter Medications as of 04/04/2020  Medication Sig   AMBULATORY NON FORMULARY MEDICATION Take 1 tablet by mouth 2 (two) times daily. Viviscal otc supplement   AMBULATORY NON FORMULARY MEDICATION Hempvanna lotion   Azelaic Acid 15 % cream After skin is  thoroughly washed and patted dry, gently but thoroughly massage a thin film of azelaic acid cream into the affected area twice daily, in the morning and evening. (Patient taking differently: Apply 1 application topically daily. After skin is thoroughly washed and patted dry, gently but thoroughly massage a thin film of azelaic acid cream into the affected area)   Calcium Carbonate-Vit D-Min (CALCIUM 1200) 1200-1000 MG-UNIT CHEW Chew 2 tablets by mouth daily.   conjugated estrogens (PREMARIN) vaginal cream PLACE 1 APPLICATORFUL VAGINALLY ONCE A WEEK.   fexofenadine (ALLEGRA) 180 MG tablet Take 180 mg by mouth daily.   hypromellose (SYSTANE OVERNIGHT THERAPY) 0.3 % GEL ophthalmic ointment Place 1 application into both eyes at bedtime.   ibuprofen (ADVIL) 200 MG tablet Take 200 mg by mouth every 8 (eight) hours as needed for headache or moderate pain.   metroNIDAZOLE (METROGEL) 0.75 % gel Apply 1 application topically 2 (two) times daily. (Patient taking differently: Apply 1 application topically at bedtime.)   Misc Natural Products (OSTEO BI-FLEX ADV JOINT SHIELD PO) Take 2 tablets by mouth daily with lunch.    Multiple Vitamins-Minerals (CENTRUM SILVER ULTRA WOMENS PO) Take 1 tablet by mouth daily at 12 noon.    omeprazole (PRILOSEC) 40 MG capsule Take 1 capsule (40 mg total) by mouth daily.   simvastatin (ZOCOR) 5 MG tablet Take 1 tablet (5 mg total) by mouth daily.   [DISCONTINUED] Polyethyl Glycol-Propyl Glycol (SYSTANE OP) Place 1 drop into both eyes daily.   No facility-administered encounter medications on file as of 04/04/2020.    Allergies (verified) Ivp dye [iodinated diagnostic agents], Augmentin [amoxicillin-pot clavulanate], Demerol [meperidine], and Morphine and related  History: Past Medical History:  Diagnosis Date   Allergy    Arthritis    Asthma    Cancer ([place])    basal cell   Gastric ulcer without hemorrhage or perforation    GERD (gastroesophageal reflux disease)     Hiatal hernia with GERD and esophagitis 10/16/2018   Hyperlipidemia    Memory change 04/05/2016   Past Surgical History:  Procedure Laterality Date   ABDOMINAL HYSTERECTOMY     BIOPSY  10/16/2018   Procedure: BIOPSY;  Surgeon: Lavena Bullion, DO;  Location: [facility];  Service: Gastroenterology;;   ESOPHAGOGASTRODUODENOSCOPY (EGD) WITH PROPOFOL N/A 10/16/2018   Procedure: ESOPHAGOGASTRODUODENOSCOPY (EGD) WITH PROPOFOL;  Surgeon: Lavena Bullion, DO;  Location: [facility];  Service: Gastroenterology;  Laterality: N/A;   TRANSORAL INCISIONLESS FUNDOPLICATION N/A 32/67/1245   Procedure: TRANSORAL INCISIONLESS FUNDOPLICATION;  Surgeon: Lavena Bullion, DO;  Location: [facility];  Service: Gastroenterology;  Laterality: N/A;   Family History  Problem Relation Age of Onset   Arthritis Mother    Breast cancer Mother        breast and ovarian, and basal cell   Ovarian cancer Mother    Basal cell carcinoma Mother    Arthritis Father    Basal cell carcinoma Father        basal cell   Heart attack Father    Diabetes Sister    Obesity Sister    Arthritis Sister    Diabetes Paternal Uncle    Diabetes Paternal Grandmother    Diabetes Paternal Grandfather    Stomach cancer Paternal Okey Regal' disease Maternal Grandfather    Heart attack Maternal Grandfather    Colon cancer Neg Hx    Pancreatic cancer Neg Hx    Social History   Socioeconomic History   Marital status: Widowed    Spouse name: Not on file   Number of children: 4   Years of education: 45   Highest education level: Master's degree (e.g., MA, MS, MEng, MEd, MSW, MBA)  Occupational History   Occupation: retired Pharmacist, hospital   Tobacco Use   Smoking status: Never Smoker   Smokeless tobacco: Never Used  Scientific laboratory technician Use: Never used  Substance and Sexual Activity   Alcohol use: Yes    Alcohol/week: 1.0 - 2.0 standard drink    Types: 1 - 2 Standard drinks or equivalent per week   Drug use: No    Sexual activity: Not Currently  Other Topics Concern   Not on file  Social History Narrative   Lives in [place]. Actively involved with social interactions and the community.    Social Determinants of Health   Financial Resource Strain: Low Risk    Difficulty of Paying Living Expenses: Not hard at all  Food Insecurity: No Food Insecurity   Worried About Charity fundraiser in the Last Year: Never true   [place] in the Last Year: Never true  Transportation Needs: No Transportation Needs   Lack of Transportation (Medical): No   Lack of Transportation (Non-Medical): No  Physical Activity: Sufficiently Active   Days of Exercise per Week: 7 days   Minutes of Exercise per Session: 40 min  Stress: No Stress Concern Present   Feeling of Stress : Only a little  Social Connections: Moderately Integrated   Frequency of Communication with Friends and Family: More than three times a week   Frequency of Social Gatherings with Friends and Family: More than  three times a week   Attends Religious Services: More than 4 times per year   Active Member of Clubs or Organizations: Yes   Attends Archivist Meetings: More than 4 times per year   Marital Status: Widowed    Tobacco Counseling Counseling given: Not Answered Non-smoker- no smoking history  Clinical Intake:  Pre-visit preparation completed: Yes  Pain : No/denies pain     BMI - recorded: 23.31 Nutritional Status: BMI of 19-24  Normal Nutritional Risks: None Diabetes: No  How often do you need to have someone help you when you read instructions, pamphlets, or other written materials from your doctor or pharmacy?: 1 - Never What is the last grade level you completed in school?: Masters Degree in Education  Diabetic? No  Interpreter Needed?: No  Information entered by :: [name], DNP   Activities of Daily Living In your present state of health, do you have any difficulty  performing the following activities: 04/04/2020  Hearing? N  Vision? N  Difficulty concentrating or making decisions? N  Walking or climbing stairs? N  Dressing or bathing? N  Doing errands, shopping? N  Preparing Food and eating ? N  Using the Toilet? N  In the past six months, have you accidently leaked urine? N  Do you have problems with loss of bowel control? N  Managing your Medications? N  Managing your Finances? N  Housekeeping or managing your Housekeeping? N  Some recent data might be hidden    Patient Care Team: Jashun Puertas, Coralee Pesa, NP as PCP - General (Nurse Practitioner)  Indicate any recent Medical Services you may have received from other than [facility] providers in the past year (date may be approximate). Dermatology Ophthalmology    Assessment:   This is a routine wellness examination for Brittany Harper.  Hearing/Vision screen No exam data present  Annual Ophthalmology visits  Dietary issues and exercise activities discussed: Current Exercise Habits: Home exercise routine, Type of exercise: walking;calisthenics, Time (Minutes): 40, Frequency (Times/Week): 7, Weekly Exercise (Minutes/Week): 280, Intensity: Moderate, Exercise limited by: None identified  Goals      DIET - INCREASE WATER INTAKE     Typically drinking 30 ounces a day and would like to increase to 45 ounces.        Depression Screen PHQ 2/9 Scores 04/04/2020 03/31/2020 03/15/2019 01/06/2018 12/24/2016 12/15/2015 12/12/2014  PHQ - 2 Score 0 0 2 0 0 0 0  PHQ- 9 Score - 0 4 - - - -    Fall Risk Fall Risk  04/04/2020 03/31/2020 03/15/2019 01/06/2018 12/24/2016  Falls in the past year? 1 - 0 0 Yes  Number falls in past yr: 0 0 0 - -  Injury with Fall? 0 1 0 - Yes  Risk for fall due to : No Fall Risks No Fall Risks - - -  Follow up Falls evaluation completed - - - -    FALL RISK PREVENTION PERTAINING TO THE HOME:  Any stairs in or around the home? Yes  If so, are there any without handrails? No  Home free of loose  throw rugs in walkways, pet beds, electrical cords, etc? Yes  Adequate lighting in your home to reduce risk of falls? Yes   ASSISTIVE DEVICES UTILIZED TO PREVENT FALLS:  Life alert? No  Use of a cane, walker or w/c? No  Grab bars in the bathroom? Yes  Shower chair or bench in shower? No  Elevated toilet seat or a handicapped toilet? Yes  TIMED UP AND GO:  Was the test performed?  Virtual .   Cognitive Function:     6CIT Screen 04/04/2020  What Year? 0 points  What month? 0 points  What time? 0 points  Count back from 20 0 points  Months in reverse 0 points  Repeat phrase 0 points  Total Score 0    Immunizations Immunization History  Administered Date(s) Administered   Influenza, High Dose Seasonal PF 10/04/2016, 09/07/2018   Influenza-Unspecified 10/16/2014, 09/15/2015, 10/03/2017, 09/21/2019   Moderna Sars-Covid-2 Vaccination 01/16/2019, 02/14/2019   Pneumococcal Conjugate-13 12/12/2014   Pneumococcal Polysaccharide-23 10/22/2008   Tdap 06/17/2011, 12/15/2015   Zoster 01/05/2004   Zoster Recombinat (Shingrix) 01/17/2017, 03/28/2017    TDAP status: Up to date  Flu Vaccine status: Up to date  Pneumococcal vaccine status: Up to date  Covid-19 vaccine status: Information provided on how to obtain vaccines.  - Up to date.  Qualifies for Shingles Vaccine?  Completed   Zostavax completed No   Shingrix Completed?: Yes  Screening Tests Health Maintenance  Topic Date Due   COVID-19 Vaccine (3 - Moderna risk 4-dose series) 03/14/2019   INFLUENZA VACCINE  08/04/2020   TETANUS/TDAP  12/14/2025   DEXA SCAN  Completed   Hepatitis C Screening  Completed   PNA vac Low Risk Adult  Completed   HPV VACCINES  Aged Out    Health Maintenance  Health Maintenance Due  Topic Date Due   COVID-19 Vaccine (3 - Moderna risk 4-dose series) 03/14/2019    Colorectal cancer screening: No longer required.   Mammogram status: Completed 06/2019- scheduled for 06/2020. Repeat  every year  Bone Density status: Completed 06/2015- scheduled for 06/2020. Results reflect: Bone density results: OSTEOPENIA. Repeat every 5 years.  Lung Cancer Screening: (Low Dose CT Chest recommended if Age 25-80 years, 30 pack-year currently smoking OR have quit w/in 15years.) does not qualify.   Lung Cancer Screening Referral: no  Additional Screening:  Hepatitis C Screening: does not qualify; Completed   Vision Screening: Recommended annual ophthalmology exams for Brittany Harper detection of glaucoma and other disorders of the eye. Is the patient up to date with their annual eye exam?  Yes  Who is the provider or what is the name of the office in which the patient attends annual eye exams?  If pt is not established with a provider, would they like to be referred to a provider to establish care?  pt established .   Dental Screening: Recommended annual dental exams for proper oral hygiene  Community Resource Referral / Chronic Care Management: CRR required this visit?  No   CCM required this visit?  No   This virtual visit was performed by telephone.    Plan:     Medicare Annual Wellness Exam 04/04/2020 Next Due: 04/04/2021  Goal for the next year:   Work to drink 45 ounces of water a day by using multiple water bottles throughout the home to reminder and convenience.    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  Future health maintenance recommendations include: Mammogram- scheduled 06/09/2020 Every 1 -2 years based on history and risk factors Starting at age 60 Bone Density Screening-  Scheduled 06/09/2020 Every 2-10 years based on history Starting at age 40 for women Cervical Cancer Screening- no longer required due to age Ages 21-39 every 3 years  Ages 49-65  every 5 years with HPV testing More frequent testing may be required based on results and history Colon Cancer Screening- no longer required due to age Every 1-10 years based on test performed, risk factors, and history Starting at age 60  Screening Labs  Complete Blood Count (CBC), Complete Metabolic Panel (CMP), Cholesterol (Lipid Panel) Every 6-12 months based on history and medications May be recommended more frequently based on current conditions or previous results Completed 03/2020 Hemoglobin A1c Lab Every 3-12 months based on history and previous results Starting at age 19 or earlier with diagnosis of diabetes, high cholesterol, BMI >26, and/or risk factors Frequent monitoring for patients with diabetes to ensure blood sugar control No risk factors at this time. Thyroid Panel (TSH w/ T3 & T4) Every 6 months based on history, symptoms, and risk factors May be repeated more often if on medication No risk factors at this time.  HIV One time testing for all patients 13 and older May be repeated more frequently for patients with increased risk factors or exposure Completed Hepatitis C One time testing for all patients 12 and older May be repeated more frequently for patients with increased risk factors or exposure Completed Gonorrhea, Chlamydia Every 12 months for all sexually active persons 13-24 years Additional monitoring may be recommended for those who are considered high risk or who have symptoms Not applicable  Vaccine Recommendations Tetanus Booster All adults every 10 years Next due 2027 Flu Vaccine All patients 6 months and older every year Next due fall 2022 COVID Vaccine All patients 12 years and older Initial dosing with booster May recommend additional booster based on age and health history Completed  Shingles Vaccine (Shingrix) 2 doses all adults 35 years and older Completed Pneumonia (Pneumovax 23) All adults 22 years and older May recommend  earlier dosing based on health history Completed Pneumonia (Prevnar 13) All adults 72 years and older Dosed 1 year after Pneumovax 23 Completed  Additional Screening, Testing, and Vaccinations may be recommended on an individualized basis based on family history, health history, risk factors, and/or exposure.     Bone Density Test A bone density test uses a type of X-ray to measure the amount of calcium and other minerals in a person's bones. It can measure bone density in the hip and the spine. The test is similar to having a regular X-ray. This test may also be called: Bone densitometry. Bone mineral density test. Dual-energy X-ray absorptiometry (DEXA). You may have this test to: Diagnose a condition that causes weak or thin bones (osteoporosis). Screen you for osteoporosis. Predict your risk for a broken bone (fracture). Determine how well your osteoporosis treatment is working. Tell a health care provider about: Any allergies you have. All medicines you are taking, including vitamins, herbs, eye drops, creams, and over-the-counter medicines. Any problems you or family members have had with anesthetic medicines. Any blood disorders you have. Any surgeries you have had. Any medical conditions you have. Whether you are pregnant or may be pregnant. Any medical tests you have had within the past 14 days that used contrast material. What are the risks? Generally, this is a safe test. However, it does expose you to a small amount of radiation, which can slightly increase your cancer risk. What happens before the test? Do not take any calcium supplements within the 24 hours before your test. You will need to remove all metal jewelry, eyeglasses, removable dental appliances, and any other metal objects on your body. What  happens during the test? You will lie down on an exam table. There will be an X-ray generator below you and an imaging device above you. Other devices, such as boxes  or braces, may be used to position your body properly for the scan. The machine will slowly scan your body. You will need to keep very still while the machine does the scan. The images will show up on a screen in the room. Images will be examined by a specialist after your test is finished. The procedure may vary among health care providers and hospitals.   What can I expect after the test? It is up to you to get the results of your test. Ask your health care provider, or the department that is doing the test, when your results will be ready. Summary A bone density test is an imaging test that uses a type of X-ray to measure the amount of calcium and other minerals in your bones. The test may be used to diagnose or screen you for a condition that causes weak or thin bones (osteoporosis), predict your risk for a broken bone (fracture), or determine how well your osteoporosis treatment is working. Do not take any calcium supplements within 24 hours before your test. Ask your health care provider, or the department that is doing the test, when your results will be ready. This information is not intended to replace advice given to you by your health care provider. Make sure you discuss any questions you have with your health care provider. Document Revised: 06/07/2019 Document Reviewed: 06/07/2019 Elsevier Patient Education  2021 [place] Prevention in the Home, Adult Falls can cause injuries and can happen to people of all ages. There are many things you can do to make your home safe and to help prevent falls. Ask for help when making these changes. What actions can I take to prevent falls? General Instructions Use good lighting in all rooms. Replace any light bulbs that burn out. Turn on the lights in dark areas. Use night-lights. Keep items that you use often in easy-to-reach places. Lower the shelves around your home if needed. Set up your furniture so you have a clear path. Avoid  moving your furniture around. Do not have throw rugs or other things on the floor that can make you trip. Avoid walking on wet floors. If any of your floors are uneven, fix them. Add color or contrast paint or tape to clearly mark and help you see: Grab bars or handrails. First and last steps of staircases. Where the edge of each step is. If you use a stepladder: Make sure that it is fully opened. Do not climb a closed stepladder. Make sure the sides of the stepladder are locked in place. Ask someone to hold the stepladder while you use it. Know where your pets are when moving through your home. What can I do in the bathroom? Keep the floor dry. Clean up any water on the floor right away. Remove soap buildup in the tub or shower. Use nonskid mats or decals on the floor of the tub or shower. Attach bath mats securely with double-sided, nonslip rug tape. If you need to sit down in the shower, use a plastic, nonslip stool. Install grab bars by the toilet and in the tub and shower. Do not use towel bars as grab bars.      What can I do in the bedroom? Make sure that you have a light by your bed  that is easy to reach. Do not use any sheets or blankets for your bed that hang to the floor. Have a firm chair with side arms that you can use for support when you get dressed. What can I do in the kitchen? Clean up any spills right away. If you need to reach something above you, use a step stool with a grab bar. Keep electrical cords out of the way. Do not use floor polish or wax that makes floors slippery. What can I do with my stairs? Do not leave any items on the stairs. Make sure that you have a light switch at the top and the bottom of the stairs. Make sure that there are handrails on both sides of the stairs. Fix handrails that are broken or loose. Install nonslip stair treads on all your stairs. Avoid having throw rugs at the top or bottom of the stairs. Choose a carpet that does not  hide the edge of the steps on the stairs. Check carpeting to make sure that it is firmly attached to the stairs. Fix carpet that is loose or worn. What can I do on the outside of my home? Use bright outdoor lighting. Fix the edges of walkways and driveways and fix any cracks. Remove anything that might make you trip as you walk through a door, such as a raised step or threshold. Trim any bushes or trees on paths to your home. Check to see if handrails are loose or broken and that both sides of all steps have handrails. Install guardrails along the edges of any raised decks and porches. Clear paths of anything that can make you trip, such as tools or rocks. Have leaves, snow, or ice cleared regularly. Use sand or salt on paths during winter. Clean up any spills in your garage right away. This includes grease or oil spills. What other actions can I take? Wear shoes that: Have a low heel. Do not wear high heels. Have rubber bottoms. Feel good on your feet and fit well. Are closed at the toe. Do not wear open-toe sandals. Use tools that help you move around if needed. These include: Canes. Walkers. Scooters. Crutches. Review your medicines with your doctor. Some medicines can make you feel dizzy. This can increase your chance of falling. Ask your doctor what else you can do to help prevent falls. Where to find more information Centers for Disease Control and Prevention, [employer]: http://www.wolf.info/ National Institute on Aging: http://kim-miller.com/ Contact a doctor if: You are afraid of falling at home. You feel weak, drowsy, or dizzy at home. You fall at home. Summary There are many simple things that you can do to make your home safe and to help prevent falls. Ways to make your home safe include removing things that can make you trip and installing grab bars in the bathroom. Ask for help when making these changes in your home. This information is not intended to replace advice given to you by  your health care provider. Make sure you discuss any questions you have with your health care provider. Document Revised: 07/25/2019 Document Reviewed: 07/25/2019 Elsevier Patient Education  [place] Maintenance, Female Adopting a healthy lifestyle and getting preventive care are important in promoting health and wellness. Ask your health care provider about: The right schedule for you to have regular tests and exams. Things you can do on your own to prevent diseases and keep yourself healthy. What should I know about diet, weight, and exercise?  Eat a healthy diet Eat a diet that includes plenty of vegetables, fruits, low-fat dairy products, and lean protein. Do not eat a lot of foods that are high in solid fats, added sugars, or sodium.   Maintain a healthy weight Body mass index (BMI) is used to identify weight problems. It estimates body fat based on height and weight. Your health care provider can help determine your BMI and help you achieve or maintain a healthy weight. Get regular exercise Get regular exercise. This is one of the most important things you can do for your health. Most adults should: Exercise for at least 150 minutes each week. The exercise should increase your heart rate and make you sweat (moderate-intensity exercise). Do strengthening exercises at least twice a week. This is in addition to the moderate-intensity exercise. Spend less time sitting. Even light physical activity can be beneficial. Watch cholesterol and blood lipids Have your blood tested for lipids and cholesterol at 77 years of age, then have this test every 5 years. Have your cholesterol levels checked more often if: Your lipid or cholesterol levels are high. You are older than 77 years of age. You are at high risk for heart disease. What should I know about cancer screening? Depending on your health history and family history, you may need to have cancer screening at various ages.  This may include screening for: Breast cancer. Cervical cancer. Colorectal cancer. Skin cancer. Lung cancer. What should I know about heart disease, diabetes, and high blood pressure? Blood pressure and heart disease High blood pressure causes heart disease and increases the risk of stroke. This is more likely to develop in people who have high blood pressure readings, are of African descent, or are overweight. Have your blood pressure checked: Every 3-5 years if you are 26-43 years of age. Every year if you are 17 years old or older. Diabetes Have regular diabetes screenings. This checks your fasting blood sugar level. Have the screening done: Once every three years after age 20 if you are at a normal weight and have a low risk for diabetes. More often and at a younger age if you are overweight or have a high risk for diabetes. What should I know about preventing infection? Hepatitis B If you have a higher risk for hepatitis B, you should be screened for this virus. Talk with your health care provider to find out if you are at risk for hepatitis B infection. Hepatitis C Testing is recommended for: Everyone born from [birth year] through 1967. Anyone with known risk factors for hepatitis C. Sexually transmitted infections (STIs) Get screened for STIs, including gonorrhea and chlamydia, if: You are sexually active and are younger than 77 years of age. You are older than 77 years of age and your health care provider tells you that you are at risk for this type of infection. Your sexual activity has changed since you were last screened, and you are at increased risk for chlamydia or gonorrhea. Ask your health care provider if you are at risk. Ask your health care provider about whether you are at high risk for HIV. Your health care provider may recommend a prescription medicine to help prevent HIV infection. If you choose to take medicine to prevent HIV, you should first get tested for HIV. You  should then be tested every 3 months for as long as you are taking the medicine. Pregnancy If you are about to stop having your period (premenopausal) and you may become pregnant, seek counseling  before you get pregnant. Take 400 to 800 micrograms (mcg) of folic acid every day if you become pregnant. Ask for birth control (contraception) if you want to prevent pregnancy. Osteoporosis and menopause Osteoporosis is a disease in which the bones lose minerals and strength with aging. This can result in bone fractures. If you are 89 years old or older, or if you are at risk for osteoporosis and fractures, ask your health care provider if you should: Be screened for bone loss. Take a calcium or vitamin D supplement to lower your risk of fractures. Be given hormone replacement therapy (HRT) to treat symptoms of menopause. Follow these instructions at home: Lifestyle Do not use any products that contain nicotine or tobacco, such as cigarettes, e-cigarettes, and chewing tobacco. If you need help quitting, ask your health care provider. Do not use street drugs. Do not share needles. Ask your health care provider for help if you need support or information about quitting drugs. Alcohol use Do not drink alcohol if: Your health care provider tells you not to drink. You are pregnant, may be pregnant, or are planning to become pregnant. If you drink alcohol: Limit how much you use to 0-1 drink a day. Limit intake if you are breastfeeding. Be aware of how much alcohol is in your drink. In the [place] one drink equals one 12 oz bottle of beer (355 mL), one 5 oz glass of wine (148 mL), or one 1 oz glass of hard liquor (44 mL). General instructions Schedule regular health, dental, and eye exams. Stay current with your vaccines. Tell your health care provider if: You often feel depressed. You have ever been abused or do not feel safe at home. Summary Adopting a healthy lifestyle and getting preventive care  are important in promoting health and wellness. Follow your health care provider's instructions about healthy diet, exercising, and getting tested or screened for diseases. Follow your health care provider's instructions on monitoring your cholesterol and blood pressure. This information is not intended to replace advice given to you by your health care provider. Make sure you discuss any questions you have with your health care provider. Document Revised: 12/14/2017 Document Reviewed: 12/14/2017 Elsevier Patient Education  2021 [employer] A mammogram is a low energy X-ray of the breasts that is done to check for abnormal changes. This procedure can screen for and detect any changes that may indicate breast cancer. Mammograms are regularly done on women. A man may have a mammogram if he has a lump or swelling in his breast. A mammogram can also identify other changes and variations in the breast, such as: Inflammation of the breast tissue (mastitis). An infected area that contains a collection of pus (abscess). A fluid-filled sac (cyst). Fibrocystic changes. This is when breast tissue becomes denser, which can make the tissue feel rope-like or uneven under the skin. Tumors that are not cancerous (benign). Tell a health care provider: About any allergies you have. If you have breast implants. If you have had previous breast disease, biopsy, or surgery. If you are breastfeeding. If you are younger than age 36. If you have a family history of breast cancer. Whether you are pregnant or may be pregnant. What are the risks? Generally, this is a safe procedure. However, problems may occur, including: Exposure to radiation. Radiation levels are very low with this test. The results being misinterpreted. The need for further tests. The inability of the mammogram to detect certain cancers. What happens before the  procedure? Schedule your test about 1-2 weeks after your menstrual  period if you are still menstruating. This is usually when your breasts are the least tender. If you have had a mammogram done at a different facility in the past, get the mammogram X-rays or have them sent to your current exam facility. The new and old images will be compared. Wash your breasts and underarms on the day of the test. Do not wear deodorants, perfumes, lotions, or powders anywhere on your body on the day of the test. Remove any jewelry from your neck. Wear clothes that you can change into and out of easily. What happens during the procedure? You will undress from the waist up and put on a gown that opens in the front. You will stand in front of the X-ray machine. Each breast will be placed between two plastic or glass plates. The plates will compress your breast for a few seconds. Try to stay as relaxed as possible during the procedure. This does not cause any harm to your breasts and any discomfort you feel will be very brief. X-rays will be taken from different angles of each breast. The procedure may vary among health care providers and hospitals.   What happens after the procedure? The mammogram will be examined by a specialist (radiologist). You may need to repeat certain parts of the test, depending on the quality of the images. This is commonly done if the radiologist needs a better view of the breast tissue. You may resume your normal activities. It is up to you to get the results of your procedure. Ask your health care provider, or the department that is doing the procedure, when your results will be ready. Summary A mammogram is a low energy X-ray of the breasts that is done to check for abnormal changes. A man may have a mammogram if he has a lump or swelling in his breast. If you have had a mammogram done at a different facility in the past, get the mammogram X-rays or have them sent to your current exam facility in order to compare them. Schedule your test about 1-2  weeks after your menstrual period if you are still menstruating. For this test, each breast will be placed between two plastic or glass plates. The plates will compress your breast for a few seconds. Ask when your test results will be ready. Make sure you get your test results. This information is not intended to replace advice given to you by your health care provider. Make sure you discuss any questions you have with your health care provider. Document Revised: 08/11/2017 Document Reviewed: 08/11/2017 Elsevier Patient Education  2021 [employer].   I have personally reviewed and noted the following in the patient's chart:   Medical and social history Use of alcohol, tobacco or illicit drugs  Current medications and supplements Functional ability and status Nutritional status Physical activity Advanced directives List of other physicians Hospitalizations, surgeries, and ER visits in previous 12 months Vitals Screenings to include cognitive, depression, and falls Referrals and appointments  In addition, I have reviewed and discussed with patient certain preventive protocols, quality metrics, and best practice recommendations. A written personalized care plan for preventive services as well as general preventive health recommendations were provided to patient.     [name], NP   04/04/2020

## 2020-04-09 ENCOUNTER — Encounter (HOSPITAL_BASED_OUTPATIENT_CLINIC_OR_DEPARTMENT_OTHER): Payer: Self-pay

## 2020-04-17 ENCOUNTER — Ambulatory Visit: Payer: Medicare PPO | Attending: Internal Medicine

## 2020-04-17 ENCOUNTER — Other Ambulatory Visit: Payer: Self-pay

## 2020-04-17 DIAGNOSIS — Z23 Encounter for immunization: Secondary | ICD-10-CM

## 2020-04-17 NOTE — Progress Notes (Signed)
   Covid-19 Vaccination Clinic  Name:  Mahrukh Seguin    MRN: [id number] DOB: [birth year]  04/17/2020  Ms. Rote was observed post Covid-19 immunization for 15 minutes without incident. She was provided with Vaccine Information Sheet and instruction to access the V-Safe system.   Ms. Yahr was instructed to call 911 with any severe reactions post vaccine: Marland Kitchen Difficulty breathing  . Swelling of face and throat  . A fast heartbeat  . A bad rash all over body  . Dizziness and weakness   Immunizations Administered    Name Date Dose VIS Date Route   Moderna Covid-19 Booster Vaccine 04/17/2020  9:14 AM 0.25 mL 10/24/2019 Intramuscular   Manufacturer: Moderna   Lot: [id number]   [place]: 36144-315-40

## 2020-04-18 ENCOUNTER — Other Ambulatory Visit (HOSPITAL_BASED_OUTPATIENT_CLINIC_OR_DEPARTMENT_OTHER): Payer: Self-pay

## 2020-04-18 MED ORDER — COVID-19 MRNA VACC (MODERNA) 100 MCG/0.5ML IM SUSP
INTRAMUSCULAR | 0 refills | Status: DC
  Filled 2020-04-18: qty 0.25, 1d supply, fill #0

## 2020-04-21 ENCOUNTER — Other Ambulatory Visit (HOSPITAL_BASED_OUTPATIENT_CLINIC_OR_DEPARTMENT_OTHER): Payer: Self-pay

## 2020-06-09 ENCOUNTER — Ambulatory Visit (HOSPITAL_BASED_OUTPATIENT_CLINIC_OR_DEPARTMENT_OTHER)
Admission: RE | Admit: 2020-06-09 | Discharge: 2020-06-09 | Disposition: A | Discharge status: Home / Self Care | Payer: Medicare PPO | Source: Ambulatory Visit | Attending: Nurse Practitioner | Admitting: Nurse Practitioner

## 2020-06-09 DIAGNOSIS — M8580 Other specified disorders of bone density and structure, unspecified site: Secondary | ICD-10-CM | POA: Diagnosis not present

## 2020-06-09 DIAGNOSIS — Z13820 Encounter for screening for osteoporosis: Secondary | ICD-10-CM | POA: Diagnosis present

## 2020-06-09 DIAGNOSIS — Z7689 Persons encountering health services in other specified circumstances: Secondary | ICD-10-CM | POA: Diagnosis not present

## 2020-06-09 DIAGNOSIS — Z1231 Encounter for screening mammogram for malignant neoplasm of breast: Secondary | ICD-10-CM | POA: Diagnosis not present

## 2020-06-09 DIAGNOSIS — Z780 Asymptomatic menopausal state: Secondary | ICD-10-CM | POA: Diagnosis not present

## 2020-06-09 NOTE — Progress Notes (Signed)
Please notify patient:  Bone Density scan shows osteopenia. Recommend continued use of calcium and vitamin D.  Will plan to rescan in 2 years to evaluate condition.

## 2020-06-11 ENCOUNTER — Telehealth (HOSPITAL_BASED_OUTPATIENT_CLINIC_OR_DEPARTMENT_OTHER): Payer: Self-pay

## 2020-06-11 NOTE — Telephone Encounter (Signed)
-----   Message from [name], NP sent at 06/09/2020  6:17 PM EDT ----- Please notify patient:  Bone Density scan shows osteopenia. Recommend continued use of calcium and vitamin D.  Will plan to rescan in 2 years to evaluate condition.

## 2020-06-11 NOTE — Telephone Encounter (Signed)
Called patient to discuss her bone density result.  She understands and agrees with recommendations.

## 2020-09-18 DIAGNOSIS — D2262 Melanocytic nevi of left upper limb, including shoulder: Secondary | ICD-10-CM | POA: Diagnosis not present

## 2020-09-18 DIAGNOSIS — D1801 Hemangioma of skin and subcutaneous tissue: Secondary | ICD-10-CM | POA: Diagnosis not present

## 2020-09-18 DIAGNOSIS — L905 Scar conditions and fibrosis of skin: Secondary | ICD-10-CM | POA: Diagnosis not present

## 2020-09-18 DIAGNOSIS — L718 Other rosacea: Secondary | ICD-10-CM | POA: Diagnosis not present

## 2020-09-18 DIAGNOSIS — L821 Other seborrheic keratosis: Secondary | ICD-10-CM | POA: Diagnosis not present

## 2020-09-18 DIAGNOSIS — D2261 Melanocytic nevi of right upper limb, including shoulder: Secondary | ICD-10-CM | POA: Diagnosis not present

## 2020-09-18 DIAGNOSIS — L814 Other melanin hyperpigmentation: Secondary | ICD-10-CM | POA: Diagnosis not present

## 2020-12-27 ENCOUNTER — Encounter (HOSPITAL_BASED_OUTPATIENT_CLINIC_OR_DEPARTMENT_OTHER): Payer: Self-pay

## 2020-12-27 ENCOUNTER — Observation Stay (HOSPITAL_BASED_OUTPATIENT_CLINIC_OR_DEPARTMENT_OTHER)
Admission: EM | Admit: 2020-12-27 | Discharge: 2020-12-28 | Disposition: A | Discharge status: Home / Self Care | Payer: Medicare PPO | Attending: Internal Medicine | Admitting: Internal Medicine

## 2020-12-27 ENCOUNTER — Emergency Department (HOSPITAL_BASED_OUTPATIENT_CLINIC_OR_DEPARTMENT_OTHER): Payer: Medicare PPO | Admitting: Radiology

## 2020-12-27 ENCOUNTER — Other Ambulatory Visit: Payer: Self-pay

## 2020-12-27 DIAGNOSIS — Z79899 Other long term (current) drug therapy: Secondary | ICD-10-CM | POA: Insufficient documentation

## 2020-12-27 DIAGNOSIS — R778 Other specified abnormalities of plasma proteins: Secondary | ICD-10-CM | POA: Diagnosis present

## 2020-12-27 DIAGNOSIS — Z20822 Contact with and (suspected) exposure to covid-19: Secondary | ICD-10-CM | POA: Diagnosis not present

## 2020-12-27 DIAGNOSIS — Z85828 Personal history of other malignant neoplasm of skin: Secondary | ICD-10-CM | POA: Diagnosis not present

## 2020-12-27 DIAGNOSIS — I4891 Unspecified atrial fibrillation: Secondary | ICD-10-CM | POA: Diagnosis not present

## 2020-12-27 DIAGNOSIS — R002 Palpitations: Secondary | ICD-10-CM | POA: Diagnosis present

## 2020-12-27 DIAGNOSIS — I214 Non-ST elevation (NSTEMI) myocardial infarction: Secondary | ICD-10-CM | POA: Diagnosis not present

## 2020-12-27 DIAGNOSIS — J9811 Atelectasis: Secondary | ICD-10-CM | POA: Diagnosis not present

## 2020-12-27 DIAGNOSIS — R1110 Vomiting, unspecified: Secondary | ICD-10-CM | POA: Diagnosis not present

## 2020-12-27 DIAGNOSIS — R42 Dizziness and giddiness: Secondary | ICD-10-CM | POA: Diagnosis not present

## 2020-12-27 DIAGNOSIS — R000 Tachycardia, unspecified: Secondary | ICD-10-CM | POA: Diagnosis not present

## 2020-12-27 LAB — BASIC METABOLIC PANEL
Anion gap: 11 (ref 5–15)
BUN: 16 mg/dL (ref 8–23)
CO2: 28 mmol/L (ref 22–32)
Calcium: 9.4 mg/dL (ref 8.9–10.3)
Chloride: 101 mmol/L (ref 98–111)
Creatinine, Ser: 0.95 mg/dL (ref 0.44–1.00)
GFR, Estimated: >60 mL/min (ref >60)
Glucose, Bld: 144 mg/dL — ABNORMAL HIGH (ref 70–99)
Potassium: 3.8 mmol/L (ref 3.5–5.1)
Sodium: 140 mmol/L (ref 135–145)

## 2020-12-27 LAB — RESP PANEL BY RT-PCR (FLU A&B, COVID) ARPGX2
Influenza A by PCR: NEGATIVE
Influenza B by PCR: NEGATIVE
SARS Coronavirus 2 by RT PCR: NEGATIVE

## 2020-12-27 LAB — CBC
HCT: 47.3 % — ABNORMAL HIGH (ref 36.0–46.0)
Hemoglobin: 15.5 g/dL — ABNORMAL HIGH (ref 12.0–15.0)
MCH: 29.8 pg (ref 26.0–34.0)
MCHC: 32.8 g/dL (ref 30.0–36.0)
MCV: 91 fL (ref 80.0–100.0)
Platelets: 230 10*3/uL (ref 150–400)
RBC: 5.2 MIL/uL — ABNORMAL HIGH (ref 3.87–5.11)
RDW: 12.5 % (ref 11.5–15.5)
WBC: 11.3 10*3/uL — ABNORMAL HIGH (ref 4.0–10.5)
nRBC: 0 % (ref 0.0–0.2)

## 2020-12-27 LAB — TROPONIN I (HIGH SENSITIVITY)
Troponin I (High Sensitivity): 38 ng/L — ABNORMAL HIGH (ref <18)
Troponin I (High Sensitivity): 100 ng/L (ref <18)

## 2020-12-27 MED ORDER — ASPIRIN 81 MG PO CHEW
324.0000 mg | CHEWABLE_TABLET | Freq: Once | ORAL | Status: AC
  Administered 2020-12-27: 20:00:00 324 mg via ORAL
  Filled 2020-12-27: qty 4

## 2020-12-27 MED ORDER — LACTATED RINGERS IV BOLUS
1000.0000 mL | Freq: Once | INTRAVENOUS | Status: AC
  Administered 2020-12-27: 16:00:00 1000 mL via INTRAVENOUS

## 2020-12-27 NOTE — ED Provider Notes (Signed)
[facility] EMERGENCY DEPT Provider Note   CSN: [id number] Arrival date & time: 12/27/20  1530     History Chief Complaint  Patient presents with   Irregular Heart Beat   Dizziness    Brittany Harper is a 75 y.o. female with PMH HLD, asthma who presents emergency department for evaluation of rapid heart rates and presyncope.  Patient states that she ate chicken pot pie earlier today at a restaurant and had sudden onset palpitations, chest pain and presyncope.  She arrives to the emergency department with a rapid irregular tachycardia.  She has no history of A. fib and is not on a blood thinner.  She states that these events happen approximately twice a year for multiple years.  Currently she denies chest pain, shortness of breath, abdominal pain, nausea, vomiting or other systemic symptoms.   Dizziness Associated symptoms: chest pain and palpitations   Associated symptoms: no shortness of breath and no vomiting       Past Medical History:  Diagnosis Date   Allergy    Arthritis    Asthma    Cancer ([place])    basal cell   Gastric ulcer without hemorrhage or perforation    GERD (gastroesophageal reflux disease)    Hiatal hernia with GERD and esophagitis 10/16/2018   Hx of skin cancer, basal cell    Hyperlipidemia    Memory change 04/05/2016    Patient Active Problem List   Diagnosis Date Noted   Atrial fibrillation with RVR ([place]) 12/27/2020   Osteopenia 04/04/2020   History of fundoplication    Gastroesophageal reflux disease with esophagitis    Rosacea 04/01/2017   Routine general medical examination at a health care facility 12/15/2015   Hyperlipemia 12/13/2014   Hx of basal cell carcinoma 12/13/2014   Postmenopausal 12/13/2014   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    Past Surgical History:  Procedure Laterality Date   ABDOMINAL HYSTERECTOMY     BIOPSY  10/16/2018   Procedure: BIOPSY;  Surgeon: Lavena Bullion, DO;   Location: [facility];  Service: Gastroenterology;;   ESOPHAGOGASTRODUODENOSCOPY (EGD) WITH PROPOFOL N/A 10/16/2018   Procedure: ESOPHAGOGASTRODUODENOSCOPY (EGD) WITH PROPOFOL;  Surgeon: Lavena Bullion, DO;  Location: [facility];  Service: Gastroenterology;  Laterality: N/A;   TRANSORAL INCISIONLESS FUNDOPLICATION N/A 78/29/5621   Procedure: TRANSORAL INCISIONLESS FUNDOPLICATION;  Surgeon: Lavena Bullion, DO;  Location: [facility];  Service: Gastroenterology;  Laterality: N/A;     OB History   No obstetric history on file.     Family History  Problem Relation Age of Onset   Arthritis Mother    Breast cancer Mother        breast and ovarian, and basal cell   Ovarian cancer Mother    Basal cell carcinoma Mother    Arthritis Father    Basal cell carcinoma Father        basal cell   Heart attack Father    Diabetes Sister    Obesity Sister    Arthritis Sister    Diabetes Paternal Uncle    Diabetes Paternal Grandmother    Diabetes Paternal Grandfather    Stomach cancer Paternal Okey Regal' disease Maternal Grandfather    Heart attack Maternal Grandfather    Colon cancer Neg Hx    Pancreatic cancer Neg Hx     Social History   Tobacco Use   Smoking status: Never   Smokeless tobacco: Never  Vaping Use   Vaping Use:  Never used  Substance Use Topics   Alcohol use: Yes    Alcohol/week: 1.0 - 2.0 standard drink    Types: 1 - 2 Standard drinks or equivalent per week   Drug use: No    Home Medications Prior to Admission medications   Medication Sig Start Date End Date Taking? Authorizing Provider  AMBULATORY NON FORMULARY MEDICATION Take 1 tablet by mouth 2 (two) times daily. Viviscal otc supplement    [provider]  AMBULATORY NON FORMULARY MEDICATION Hempvanna lotion    [provider]  Azelaic Acid 15 % cream After skin is thoroughly washed and patted dry, gently but thoroughly massage a thin film of azelaic acid cream into the affected  area twice daily, in the morning and evening. Patient taking differently: Apply 1 application topically daily. After skin is thoroughly washed and patted dry, gently but thoroughly massage a thin film of azelaic acid cream into the affected area 12/12/14   Hoyt Koch, MD  Calcium Carbonate-Vit D-Min (CALCIUM 1200) 1200-1000 MG-UNIT CHEW Chew 2 tablets by mouth daily.    [provider]  conjugated estrogens (PREMARIN) vaginal cream PLACE 1 APPLICATORFUL VAGINALLY ONCE A WEEK. 03/31/20   Early, Coralee Pesa, NP  COVID-19 mRNA vaccine, Moderna, 100 MCG/0.5ML injection Inject into the muscle. 04/17/20   Carlyle Basques, MD  fexofenadine (ALLEGRA) 180 MG tablet Take 180 mg by mouth daily.    [provider]  hypromellose (SYSTANE OVERNIGHT THERAPY) 0.3 % GEL ophthalmic ointment Place 1 application into both eyes at bedtime.    [provider]  ibuprofen (ADVIL) 200 MG tablet Take 200 mg by mouth every 8 (eight) hours as needed for headache or moderate pain.    [provider]  metroNIDAZOLE (METROGEL) 0.75 % gel Apply 1 application topically 2 (two) times daily. Patient taking differently: Apply 1 application topically at bedtime. 04/01/17   Hoyt Koch, MD  Misc Natural Products (OSTEO BI-FLEX ADV JOINT SHIELD PO) Take 2 tablets by mouth daily with lunch.     [provider]  Multiple Vitamins-Minerals (CENTRUM SILVER ULTRA WOMENS PO) Take 1 tablet by mouth daily at 12 noon.     [provider]  omeprazole (PRILOSEC) 40 MG capsule Take 1 capsule (40 mg total) by mouth daily. 03/31/20   [name], NP  simvastatin (ZOCOR) 5 MG tablet Take 1 tablet (5 mg total) by mouth daily. 03/31/20   [name], NP    Allergies    Ivp dye [iodinated contrast media], Augmentin [amoxicillin-pot clavulanate], Demerol [meperidine], Morphine and related, and Shellfish allergy  Review of Systems   Review of Systems  Constitutional:  Negative for  chills and fever.  HENT:  Negative for ear pain and sore throat.   Eyes:  Negative for pain and visual disturbance.  Respiratory:  Negative for cough and shortness of breath.   Cardiovascular:  Positive for chest pain and palpitations.  Gastrointestinal:  Negative for abdominal pain and vomiting.  Genitourinary:  Negative for dysuria and hematuria.  Musculoskeletal:  Negative for arthralgias and back pain.  Skin:  Negative for color change and rash.  Neurological:  Positive for dizziness and syncope. Negative for seizures.  All other systems reviewed and are negative.  Physical Exam Updated Vital Signs BP (!) 116/59    Pulse 75    Temp (!) 97.1 F (36.2 C)    Resp 13    Ht 5\' 4"  (1.626 m)    Wt 63.5 kg  SpO2 98%    BMI 24.03 kg/m   Physical Exam Vitals and nursing note reviewed.  Constitutional:      General: She is not in acute distress.    Appearance: She is well-developed.  HENT:     Head: Normocephalic and atraumatic.  Eyes:     Conjunctiva/sclera: Conjunctivae normal.  Cardiovascular:     Rate and Rhythm: Tachycardia present. Rhythm irregular.     Heart sounds: No murmur heard. Pulmonary:     Effort: Pulmonary effort is normal. No respiratory distress.     Breath sounds: Normal breath sounds.  Abdominal:     Palpations: Abdomen is soft.     Tenderness: There is no abdominal tenderness.  Musculoskeletal:        General: No swelling.     Cervical back: Neck supple.  Skin:    General: Skin is warm and dry.     Capillary Refill: Capillary refill takes less than 2 seconds.  Neurological:     Mental Status: She is alert.  Psychiatric:        Mood and Affect: Mood normal.    ED Results / Procedures / Treatments   Labs (all labs ordered are listed, but only abnormal results are displayed) Labs Reviewed  BASIC METABOLIC PANEL - Abnormal; Notable for the following components:      Result Value   Glucose, Bld 144 (*)    All other components within normal limits   CBC - Abnormal; Notable for the following components:   WBC 11.3 (*)    RBC 5.20 (*)    Hemoglobin 15.5 (*)    HCT 47.3 (*)    All other components within normal limits  TROPONIN I (HIGH SENSITIVITY) - Abnormal; Notable for the following components:   Troponin I (High Sensitivity) 38 (*)    All other components within normal limits  TROPONIN I (HIGH SENSITIVITY) - Abnormal; Notable for the following components:   Troponin I (High Sensitivity) 100 (*)    All other components within normal limits  RESP PANEL BY RT-PCR (FLU A&B, COVID) [id number]    EKG None  Radiology DG Chest 2 View  Result Date: 12/27/2020 CLINICAL DATA:  Dizziness, tachycardia, chest pain, arrhythmia and vomiting for 4 hours EXAM: CHEST - 2 VIEW COMPARISON:  None FINDINGS: Normal heart size, mediastinal contours, and pulmonary vascularity. Minimal subsegmental atelectasis at lung bases. Lungs otherwise clear. No pulmonary infiltrate, pleural effusion, or pneumothorax. Osseous structures unremarkable. IMPRESSION: Minimal bibasilar atelectasis. Electronically Signed   By: Lavonia Dana M.D.   On: 12/27/2020 16:38    Procedures Procedures   Medications Ordered in ED Medications  lactated ringers bolus 1,000 mL (0 mLs Intravenous Stopped 12/27/20 1831)  aspirin chewable tablet 324 mg (324 mg Oral Given 12/27/20 1948)    ED Course  I have reviewed the triage vital signs and the nursing notes.  Pertinent labs & imaging results that were available during my care of the patient were reviewed by me and considered in my medical decision making (see chart for details).    MDM Rules/Calculators/A&P                          Patient seen emergency department for evaluation of palpitations and presyncope.  Physical exam reveals an irregular tachycardia but is otherwise unremarkable.  Laboratory evaluation with a very mild leukocytosis to 11.3, COVID and flu negative.  Initial troponin 36, and unfortunately delta troponin  significantly increased 200.  Patient self converted almost immediately out of A. fib back to normal sinus rhythm without need for medication or vagal maneuvers.  I spoke with cardiology who recommends observation admission due to uptrending troponin with aspirin and no heparin.  Patient then admitted.   Final Clinical Impression(s) / ED Diagnoses Final diagnoses:  None    Rx / DC Orders ED Discharge Orders     None        Ronon Ferger, Debe Coder, MD 12/27/20 2118

## 2020-12-27 NOTE — ED Notes (Signed)
Patient denies chest pain and is resting comfortably.

## 2020-12-27 NOTE — Plan of Care (Addendum)
[facility] transfer note:  75 yo F with a.fib RVR, self converted, but trop is uptrending.  EDP wants ON obs, asprin per cards.  Doesn't need heparin for NSTEMI.  But may need AC for A.Fib stroke prevention.  Will put in for tele bed.  [facility] will assume care on arrival to accepting facility. Until arrival, care as per EDP. However, [facility] available 24/7 for questions and assistance.  Nursing staff, please page [place] and Consults [phone]) as soon as the patient arrives the hospital.

## 2020-12-27 NOTE — ED Notes (Signed)
Patient transported to X-ray 

## 2020-12-27 NOTE — ED Triage Notes (Signed)
Pt states that for the last week, she has had cough and sore throat.   Pt had an allergic reaction with her body becoming hot all over and heart racing after eating chicken at noon. Pt states she took benadryl and an ice pack on chest. Pt states that she then threw up and was dizzy. Pt states no relief with laying down.   Pt endorses some head fuzziness.

## 2020-12-28 ENCOUNTER — Observation Stay (HOSPITAL_COMMUNITY): Payer: Medicare PPO

## 2020-12-28 ENCOUNTER — Encounter (HOSPITAL_COMMUNITY): Payer: Self-pay | Admitting: Internal Medicine

## 2020-12-28 ENCOUNTER — Other Ambulatory Visit: Payer: Self-pay | Admitting: Cardiology

## 2020-12-28 DIAGNOSIS — I214 Non-ST elevation (NSTEMI) myocardial infarction: Secondary | ICD-10-CM | POA: Diagnosis not present

## 2020-12-28 DIAGNOSIS — Z20822 Contact with and (suspected) exposure to covid-19: Secondary | ICD-10-CM | POA: Diagnosis not present

## 2020-12-28 DIAGNOSIS — R778 Other specified abnormalities of plasma proteins: Secondary | ICD-10-CM | POA: Diagnosis not present

## 2020-12-28 DIAGNOSIS — I4891 Unspecified atrial fibrillation: Secondary | ICD-10-CM | POA: Diagnosis not present

## 2020-12-28 DIAGNOSIS — Z85828 Personal history of other malignant neoplasm of skin: Secondary | ICD-10-CM | POA: Diagnosis not present

## 2020-12-28 DIAGNOSIS — R002 Palpitations: Secondary | ICD-10-CM | POA: Diagnosis present

## 2020-12-28 DIAGNOSIS — I480 Paroxysmal atrial fibrillation: Secondary | ICD-10-CM

## 2020-12-28 DIAGNOSIS — Z79899 Other long term (current) drug therapy: Secondary | ICD-10-CM | POA: Diagnosis not present

## 2020-12-28 LAB — CBC WITH DIFFERENTIAL/PLATELET
Abs Immature Granulocytes: 0.04 10*3/uL (ref 0.00–0.07)
Basophils Absolute: 0 10*3/uL (ref 0.0–0.1)
Basophils Relative: 0 %
Eosinophils Absolute: 0.1 10*3/uL (ref 0.0–0.5)
Eosinophils Relative: 1 %
HCT: 40.3 % (ref 36.0–46.0)
Hemoglobin: 13.2 g/dL (ref 12.0–15.0)
Immature Granulocytes: 0 %
Lymphocytes Relative: 15 %
Lymphs Abs: 2 10*3/uL (ref 0.7–4.0)
MCH: 29.7 pg (ref 26.0–34.0)
MCHC: 32.8 g/dL (ref 30.0–36.0)
MCV: 90.6 fL (ref 80.0–100.0)
Monocytes Absolute: 1.2 10*3/uL — ABNORMAL HIGH (ref 0.1–1.0)
Monocytes Relative: 9 %
Neutro Abs: 9.9 10*3/uL — ABNORMAL HIGH (ref 1.7–7.7)
Neutrophils Relative %: 75 %
Platelets: 230 10*3/uL (ref 150–400)
RBC: 4.45 MIL/uL (ref 3.87–5.11)
RDW: 12.3 % (ref 11.5–15.5)
WBC: 13.2 10*3/uL — ABNORMAL HIGH (ref 4.0–10.5)
nRBC: 0 % (ref 0.0–0.2)

## 2020-12-28 LAB — BASIC METABOLIC PANEL
Anion gap: 10 (ref 5–15)
BUN: 12 mg/dL (ref 8–23)
CO2: 24 mmol/L (ref 22–32)
Calcium: 8.5 mg/dL — ABNORMAL LOW (ref 8.9–10.3)
Chloride: 104 mmol/L (ref 98–111)
Creatinine, Ser: 0.62 mg/dL (ref 0.44–1.00)
GFR, Estimated: >60 mL/min (ref >60)
Glucose, Bld: 106 mg/dL — ABNORMAL HIGH (ref 70–99)
Potassium: 3.7 mmol/L (ref 3.5–5.1)
Sodium: 138 mmol/L (ref 135–145)

## 2020-12-28 LAB — ECHOCARDIOGRAM COMPLETE
AR max vel: 1.62 cm2
AV Area VTI: 1.55 cm2
AV Area mean vel: 1.52 cm2
AV Mean grad: 4 mmHg
AV Peak grad: 7.7 mmHg
Ao pk vel: 1.39 m/s
Area-P 1/2: 3.31 cm2
Height: 64 in
MV VTI: 1.65 cm2
S' Lateral: 2.8 cm
Weight: 2239.87 oz

## 2020-12-28 LAB — TSH: TSH: 1.149 u[IU]/mL (ref 0.350–4.500)

## 2020-12-28 LAB — TROPONIN I (HIGH SENSITIVITY)
Troponin I (High Sensitivity): 174 ng/L (ref <18)
Troponin I (High Sensitivity): 177 ng/L (ref <18)

## 2020-12-28 LAB — MRSA NEXT GEN BY PCR, NASAL: MRSA by PCR Next Gen: NOT DETECTED

## 2020-12-28 LAB — HEPARIN LEVEL (UNFRACTIONATED): Heparin Unfractionated: 0.36 IU/mL (ref 0.30–0.70)

## 2020-12-28 MED ORDER — SODIUM CHLORIDE 0.9% FLUSH: 3.0000 mL | INTRAVENOUS | Status: DC | PRN

## 2020-12-28 MED ORDER — ASPIRIN EC 81 MG PO TBEC: 81.0000 mg | DELAYED_RELEASE_TABLET | Freq: Every day | ORAL | Status: DC

## 2020-12-28 MED ORDER — HEPARIN BOLUS VIA INFUSION
3000.0000 [IU] | Freq: Once | INTRAVENOUS | Status: AC
  Administered 2020-12-28: 02:00:00 3000 [IU] via INTRAVENOUS
  Filled 2020-12-28: qty 3000

## 2020-12-28 MED ORDER — METOPROLOL TARTRATE 25 MG PO TABS: 25.0000 mg | ORAL_TABLET | Freq: Two times a day (BID) | ORAL | 2 refills | Status: DC

## 2020-12-28 MED ORDER — METOPROLOL TARTRATE 25 MG PO TABS
25.0000 mg | ORAL_TABLET | Freq: Two times a day (BID) | ORAL | Status: DC
  Administered 2020-12-28: 09:00:00 25 mg via ORAL
  Filled 2020-12-28: qty 1

## 2020-12-28 MED ORDER — HEPARIN (PORCINE) 25000 UT/250ML-% IV SOLN
900.0000 [IU]/h | INTRAVENOUS | Status: DC
  Administered 2020-12-28: 02:00:00 900 [IU]/h via INTRAVENOUS
  Filled 2020-12-28: qty 250

## 2020-12-28 MED ORDER — DRONEDARONE HCL 400 MG PO TABS
400.0000 mg | ORAL_TABLET | Freq: Two times a day (BID) | ORAL | Status: DC
  Administered 2020-12-28: 14:00:00 400 mg via ORAL
  Filled 2020-12-28 (×2): qty 1

## 2020-12-28 MED ORDER — SIMVASTATIN 5 MG PO TABS
5.0000 mg | ORAL_TABLET | Freq: Every day | ORAL | Status: DC
  Administered 2020-12-28: 09:00:00 5 mg via ORAL
  Filled 2020-12-28: qty 1

## 2020-12-28 MED ORDER — ACETAMINOPHEN 650 MG RE SUPP: 650.0000 mg | Freq: Four times a day (QID) | RECTAL | Status: DC | PRN

## 2020-12-28 MED ORDER — DRONEDARONE HCL 400 MG PO TABS: 400.0000 mg | ORAL_TABLET | Freq: Two times a day (BID) | ORAL | 2 refills | Status: DC

## 2020-12-28 MED ORDER — SODIUM CHLORIDE 0.9% FLUSH
3.0000 mL | Freq: Two times a day (BID) | INTRAVENOUS | Status: DC
  Administered 2020-12-28 (×2): 3 mL via INTRAVENOUS

## 2020-12-28 MED ORDER — ACETAMINOPHEN 325 MG PO TABS: 650.0000 mg | ORAL_TABLET | Freq: Four times a day (QID) | ORAL | Status: DC | PRN

## 2020-12-28 MED ORDER — APIXABAN 5 MG PO TABS
5.0000 mg | ORAL_TABLET | Freq: Two times a day (BID) | ORAL | Status: DC
  Administered 2020-12-28: 14:00:00 5 mg via ORAL
  Filled 2020-12-28: qty 1

## 2020-12-28 MED ORDER — APIXABAN 5 MG PO TABS: 5.0000 mg | ORAL_TABLET | Freq: Two times a day (BID) | ORAL | 3 refills | Status: DC

## 2020-12-28 NOTE — Progress Notes (Signed)
ANTICOAGULATION CONSULT NOTE - Initial Consult  Pharmacy Consult for heparin Indication: atrial fibrillation  Allergies  Allergen Reactions   Ivp Dye [Iodinated Contrast Media] Shortness Of Breath    Feels hot   Augmentin [Amoxicillin-Pot Clavulanate] Nausea And Vomiting    Did it involve swelling of the face/tongue/throat, SOB, or low BP? No Did it involve sudden or severe rash/hives, skin peeling, or any reaction on the inside of your mouth or nose? No Did you need to seek medical attention at a hospital or doctor's office? No When did it last happen?      20+ years If all above answers are NO, may proceed with cephalosporin use.    Demerol [Meperidine] Nausea And Vomiting   Morphine And Related Nausea And Vomiting   Shellfish Allergy     Patient Measurements: Height: 5\' 4"  (162.6 cm) Weight: 63.5 kg (139 lb 15.9 oz) IBW/kg (Calculated) : 54.7  Vital Signs: Temp: 98.5 F (36.9 C) (12/25 0008) Temp Source: Oral (12/25 0008) BP: 133/63 (12/25 0008) Pulse Rate: 72 (12/25 0008)  Labs: Recent Labs    12/27/20 1540 12/27/20 1740  HGB 15.5*  --   HCT 47.3*  --   PLT 230  --   CREATININE 0.95  --   TROPONINIHS 38* 100*    Estimated Creatinine Clearance: 42.8 mL/min (by C-G formula based on SCr of 0.95 mg/dL).   Medical History: Past Medical History:  Diagnosis Date   Allergy    Arthritis    Asthma    Cancer ([place])    basal cell   Gastric ulcer without hemorrhage or perforation    GERD (gastroesophageal reflux disease)    Hiatal hernia with GERD and esophagitis 10/16/2018   Hx of skin cancer, basal cell    Hyperlipidemia    Memory change 04/05/2016    Medications:  Medications Prior to Admission  Medication Sig Dispense Refill Last Dose   AMBULATORY NON FORMULARY MEDICATION Take 1 tablet by mouth 2 (two) times daily. Viviscal otc supplement      AMBULATORY NON FORMULARY MEDICATION Hempvanna lotion      Azelaic Acid 15 % cream After skin is thoroughly  washed and patted dry, gently but thoroughly massage a thin film of azelaic acid cream into the affected area twice daily, in the morning and evening. (Patient taking differently: Apply 1 application topically daily. After skin is thoroughly washed and patted dry, gently but thoroughly massage a thin film of azelaic acid cream into the affected area) 150 g 3    Calcium Carbonate-Vit D-Min (CALCIUM 1200) 1200-1000 MG-UNIT CHEW Chew 2 tablets by mouth daily.      conjugated estrogens (PREMARIN) vaginal cream PLACE 1 APPLICATORFUL VAGINALLY ONCE A WEEK. 30 g 11    COVID-19 mRNA vaccine, Moderna, 100 MCG/0.5ML injection Inject into the muscle. 0.25 mL 0    fexofenadine (ALLEGRA) 180 MG tablet Take 180 mg by mouth daily.      hypromellose (SYSTANE OVERNIGHT THERAPY) 0.3 % GEL ophthalmic ointment Place 1 application into both eyes at bedtime.      ibuprofen (ADVIL) 200 MG tablet Take 200 mg by mouth every 8 (eight) hours as needed for headache or moderate pain.      metroNIDAZOLE (METROGEL) 0.75 % gel Apply 1 application topically 2 (two) times daily. (Patient taking differently: Apply 1 application topically at bedtime.) 45 g 0    Misc Natural Products (OSTEO BI-FLEX ADV JOINT SHIELD PO) Take 2 tablets by mouth daily with lunch.  Multiple Vitamins-Minerals (CENTRUM SILVER ULTRA WOMENS PO) Take 1 tablet by mouth daily at 12 noon.       omeprazole (PRILOSEC) 40 MG capsule Take 1 capsule (40 mg total) by mouth daily. 90 capsule 3    simvastatin (ZOCOR) 5 MG tablet Take 1 tablet (5 mg total) by mouth daily. 90 tablet 3    Scheduled:   aspirin EC  81 mg Oral Daily   simvastatin  5 mg Oral Daily    Assessment: 75yo female c/o racing heart and N/V >> found to be in Afib, to begin heparin.  Goal of Therapy:  Heparin level 0.3-0.7 units/ml Monitor platelets by anticoagulation protocol: Yes   Plan:  Heparin 3000 units IV bolus x1 followed by infusion 900 units/hr and monitor heparin levels and  CBC.  Wynona Neat, PharmD, BCPS  12/28/2020,1:26 AM

## 2020-12-28 NOTE — TOC Transition Note (Signed)
Transition of Care [facility]) - CM/SW Discharge Note   Patient Details  Name: Brittany Harper MRN: [id number] Date of Birth: December 10, 1945  Transition [facility]) CM/SW Contact:  Bartholomew Crews, RN Phone Number: [phone] 12/28/2020, 12:18 PM   Clinical Narrative:     Patient to transition home today on Eliquis. Provided free trial Eliquis card. Patient expressed appreciation. No further TOC needs identified at this time.   Final next level of care: Home/Self Care Barriers to Discharge: No Barriers Identified   Patient Goals and CMS Choice Patient states their goals for this hospitalization and ongoing recovery are:: return home CMS Medicare.gov Compare Post Acute Care list provided to:: Patient Choice offered to / list presented to : NA  Discharge Placement                       Discharge Plan and Services                                     Social Determinants of Health (SDOH) Interventions     Readmission Risk Interventions No flowsheet data found.

## 2020-12-28 NOTE — Progress Notes (Signed)
Date and time results received: 12/28/20 0530 (use smartphrase ".now" to insert current time)  Test: Troponin Critical Value: 174  Name of Provider Notified: Leeanne Mannan, MD  Orders Received? Or Actions Taken?: Continue to monitor

## 2020-12-28 NOTE — Progress Notes (Signed)
°  Echocardiogram 2D Echocardiogram has been performed.  Brittany Harper 12/28/2020, 11:24 AM

## 2020-12-28 NOTE — H&P (Signed)
History and Physical    Brittany Harper NFA:[id number] DOB: December 16, 1945 DOA: 12/27/2020  PCP: [name], NP  Patient coming from: Home.  Chief Complaint: Palpitations.  HPI: Brittany Harper is a 75 y.o. female with a history of hyperlipidemia was brought to the ER after patient has palpitations.  Patient stated yesterday after eating she felt flushed and tachycardic and she has had prior episodes of this which she attributed to allergies and she did take Benadryl which usually makes it better within few minutes but this time it persisted she decided to come to the ER.  Denies any shortness of breath chest pain.  ED Course: In the ER patient was found to be in A. fib with RVR.  She converted to sinus rhythm as per the report before any intervention.  Sensitive troponins came as 38 and 100 at this point it was decided to admit patient for further observation.  At the time of my exam patient is asymptomatic denies any chest pain.  Review of Systems: As per HPI, rest all negative.   Past Medical History:  Diagnosis Date   Allergy    Arthritis    Asthma    Cancer ([place])    basal cell   Gastric ulcer without hemorrhage or perforation    GERD (gastroesophageal reflux disease)    Hiatal hernia with GERD and esophagitis 10/16/2018   Hx of skin cancer, basal cell    Hyperlipidemia    Memory change 04/05/2016    Past Surgical History:  Procedure Laterality Date   ABDOMINAL HYSTERECTOMY     BIOPSY  10/16/2018   Procedure: BIOPSY;  Surgeon: Lavena Bullion, DO;  Location: [facility];  Service: Gastroenterology;;   ESOPHAGOGASTRODUODENOSCOPY (EGD) WITH PROPOFOL N/A 10/16/2018   Procedure: ESOPHAGOGASTRODUODENOSCOPY (EGD) WITH PROPOFOL;  Surgeon: Lavena Bullion, DO;  Location: [facility];  Service: Gastroenterology;  Laterality: N/A;   TRANSORAL INCISIONLESS FUNDOPLICATION N/A 46/96/2952   Procedure: TRANSORAL INCISIONLESS FUNDOPLICATION;  Surgeon: Lavena Bullion, DO;  Location:  [facility];  Service: Gastroenterology;  Laterality: N/A;     reports that she has never smoked. She has never used smokeless tobacco. She reports current alcohol use of about 1.0 - 2.0 standard drink per week. She reports that she does not use drugs.  Allergies  Allergen Reactions   Ivp Dye [Iodinated Contrast Media] Shortness Of Breath    Feels hot   Augmentin [Amoxicillin-Pot Clavulanate] Nausea And Vomiting    Did it involve swelling of the face/tongue/throat, SOB, or low BP? No Did it involve sudden or severe rash/hives, skin peeling, or any reaction on the inside of your mouth or nose? No Did you need to seek medical attention at a hospital or doctor's office? No When did it last happen?      20+ years If all above answers are NO, may proceed with cephalosporin use.    Demerol [Meperidine] Nausea And Vomiting   Morphine And Related Nausea And Vomiting   Shellfish Allergy     Family History  Problem Relation Age of Onset   Arthritis Mother    Breast cancer Mother        breast and ovarian, and basal cell   Ovarian cancer Mother    Basal cell carcinoma Mother    Arthritis Father    Basal cell carcinoma Father        basal cell   Heart attack Father    Diabetes Sister    Obesity Sister    Arthritis Sister  Diabetes Paternal Uncle    Diabetes Paternal Grandmother    Diabetes Paternal Grandfather    Stomach cancer Paternal Okey Regal' disease Maternal Grandfather    Heart attack Maternal Grandfather    Colon cancer Neg Hx    Pancreatic cancer Neg Hx     Prior to Admission medications   Medication Sig Start Date End Date Taking? Authorizing Provider  AMBULATORY NON FORMULARY MEDICATION Take 1 tablet by mouth 2 (two) times daily. Viviscal otc supplement    [provider]  AMBULATORY NON FORMULARY MEDICATION Hempvanna lotion    [provider]  Azelaic Acid 15 % cream After skin is thoroughly washed and patted dry, gently but thoroughly  massage a thin film of azelaic acid cream into the affected area twice daily, in the morning and evening. Patient taking differently: Apply 1 application topically daily. After skin is thoroughly washed and patted dry, gently but thoroughly massage a thin film of azelaic acid cream into the affected area 12/12/14   Hoyt Koch, MD  Calcium Carbonate-Vit D-Min (CALCIUM 1200) 1200-1000 MG-UNIT CHEW Chew 2 tablets by mouth daily.    [provider]  conjugated estrogens (PREMARIN) vaginal cream PLACE 1 APPLICATORFUL VAGINALLY ONCE A WEEK. 03/31/20   Early, Coralee Pesa, NP  COVID-19 mRNA vaccine, Moderna, 100 MCG/0.5ML injection Inject into the muscle. 04/17/20   Carlyle Basques, MD  fexofenadine (ALLEGRA) 180 MG tablet Take 180 mg by mouth daily.    [provider]  hypromellose (SYSTANE OVERNIGHT THERAPY) 0.3 % GEL ophthalmic ointment Place 1 application into both eyes at bedtime.    [provider]  ibuprofen (ADVIL) 200 MG tablet Take 200 mg by mouth every 8 (eight) hours as needed for headache or moderate pain.    [provider]  metroNIDAZOLE (METROGEL) 0.75 % gel Apply 1 application topically 2 (two) times daily. Patient taking differently: Apply 1 application topically at bedtime. 04/01/17   Hoyt Koch, MD  Misc Natural Products (OSTEO BI-FLEX ADV JOINT SHIELD PO) Take 2 tablets by mouth daily with lunch.     [provider]  Multiple Vitamins-Minerals (CENTRUM SILVER ULTRA WOMENS PO) Take 1 tablet by mouth daily at 12 noon.     [provider]  omeprazole (PRILOSEC) 40 MG capsule Take 1 capsule (40 mg total) by mouth daily. 03/31/20   [name], NP  simvastatin (ZOCOR) 5 MG tablet Take 1 tablet (5 mg total) by mouth daily. 03/31/20   [name], NP    Physical Exam: Constitutional: Moderately built and nourished. Vitals:   12/27/20 2100 12/27/20 2200 12/27/20 2359 12/28/20 0008  BP: (!) 116/59 129/84  133/63  Pulse: 75  69  72  Resp: 13 15  13   Temp:    98.5 F (36.9 C)  TempSrc:    Oral  SpO2: 98% 97%  96%  Weight:   63.5 kg   Height:   5\' 4"  (1.626 m)    Eyes: Anicteric no pallor. ENMT: No discharge from the ears eyes nose and mouth. Neck: No mass felt.  No neck rigidity. Respiratory: No rhonchi or crepitations. Cardiovascular: S1-S2 heard. Abdomen: Soft nontender bowel sound present. Musculoskeletal: No edema. Skin: No rash. Neurologic: Alert awake oriented time place and person.  Moves all extremities. Psychiatric: Appears normal.  Normal affect.   Labs on Admission: I have personally reviewed following labs and imaging studies  CBC: Recent Labs  Lab 12/27/20 1540  WBC 11.3*  HGB 15.5*  HCT 47.3*  MCV 91.0  PLT 485   Basic Metabolic Panel: Recent Labs  Lab 12/27/20 1540  NA 140  K 3.8  CL 101  CO2 28  GLUCOSE 144*  BUN 16  CREATININE 0.95  CALCIUM 9.4   GFR: Estimated Creatinine Clearance: 42.8 mL/min (by C-G formula based on SCr of 0.95 mg/dL). Liver Function Tests: No results for input(s): AST, ALT, ALKPHOS, BILITOT, PROT, ALBUMIN in the last 168 hours. No results for input(s): LIPASE, AMYLASE in the last 168 hours. No results for input(s): AMMONIA in the last 168 hours. Coagulation Profile: No results for input(s): INR, PROTIME in the last 168 hours. Cardiac Enzymes: No results for input(s): CKTOTAL, CKMB, CKMBINDEX, TROPONINI in the last 168 hours. BNP (last 3 results) No results for input(s): PROBNP in the last 8760 hours. HbA1C: No results for input(s): HGBA1C in the last 72 hours. CBG: No results for input(s): GLUCAP in the last 168 hours. Lipid Profile: No results for input(s): CHOL, HDL, LDLCALC, TRIG, CHOLHDL, LDLDIRECT in the last 72 hours. Thyroid Function Tests: No results for input(s): TSH, T4TOTAL, FREET4, T3FREE, THYROIDAB in the last 72 hours. Anemia Panel: No results for input(s): VITAMINB12, FOLATE, FERRITIN, TIBC, IRON, RETICCTPCT in the last  72 hours. Urine analysis: No results found for: COLORURINE, APPEARANCEUR, LABSPEC, PHURINE, GLUCOSEU, HGBUR, BILIRUBINUR, KETONESUR, PROTEINUR, UROBILINOGEN, NITRITE, LEUKOCYTESUR Sepsis Labs: @LABRCNTIP (procalcitonin:4,lacticidven:4) ) Recent Results (from the past 240 hour(s))  Resp Panel by RT-PCR (Flu A&B, Covid) Nasopharyngeal Swab     Status: None   Collection Time: 12/27/20  3:41 PM   Specimen: Nasopharyngeal Swab; Nasopharyngeal(NP) swabs in vial transport medium  Result Value Ref Range Status   SARS Coronavirus 2 by RT PCR NEGATIVE NEGATIVE Final    Comment: (NOTE) SARS-CoV-2 target nucleic acids are NOT DETECTED.  The SARS-CoV-2 RNA is generally detectable in upper respiratory specimens during the acute phase of infection. The lowest concentration of SARS-CoV-2 viral copies this assay can detect is 138 copies/mL. A negative result does not preclude SARS-Cov-2 infection and should not be used as the sole basis for treatment or other patient management decisions. A negative result may occur with  improper specimen collection/handling, submission of specimen other than nasopharyngeal swab, presence of viral mutation(s) within the areas targeted by this assay, and inadequate number of viral copies(<138 copies/mL). A negative result must be combined with clinical observations, patient history, and epidemiological information. The expected result is Negative.  Fact Sheet for Patients:  EntrepreneurPulse.com.au  Fact Sheet for Healthcare Providers:  IncredibleEmployment.be  This test is no t yet approved or cleared by the [place] FDA and  has been authorized for detection and/or diagnosis of SARS-CoV-2 by FDA under an Emergency Use Authorization (EUA). This EUA will remain  in effect (meaning this test can be used) for the duration of the COVID-19 declaration under Section 564(b)(1) of the Act, 21 U.S.C.section 360bbb-3(b)(1), unless  the authorization is terminated  or revoked sooner.       Influenza A by PCR NEGATIVE NEGATIVE Final   Influenza B by PCR NEGATIVE NEGATIVE Final    Comment: (NOTE) The Xpert Xpress SARS-CoV-2/FLU/RSV plus assay is intended as an aid in the diagnosis of influenza from Nasopharyngeal swab specimens and should not be used as a sole basis for treatment. Nasal washings and aspirates are unacceptable for Xpert Xpress SARS-CoV-2/FLU/RSV testing.  Fact Sheet for Patients: EntrepreneurPulse.com.au  Fact Sheet for Healthcare Providers: IncredibleEmployment.be  This test is not yet approved or cleared by the [place]  FDA and has been authorized for detection and/or diagnosis of SARS-CoV-2 by FDA under an Emergency Use Authorization (EUA). This EUA will remain in effect (meaning this test can be used) for the duration of the COVID-19 declaration under Section 564(b)(1) of the Act, 21 U.S.C. section 360bbb-3(b)(1), unless the authorization is terminated or revoked.  Performed at [facility], [address] [place],  [address]      Radiological Exams on Admission: DG Chest 2 View  Result Date: 12/27/2020 CLINICAL DATA:  Dizziness, tachycardia, chest pain, arrhythmia and vomiting for 4 hours EXAM: CHEST - 2 VIEW COMPARISON:  None FINDINGS: Normal heart size, mediastinal contours, and pulmonary vascularity. Minimal subsegmental atelectasis at lung bases. Lungs otherwise clear. No pulmonary infiltrate, pleural effusion, or pneumothorax. Osseous structures unremarkable. IMPRESSION: Minimal bibasilar atelectasis. Electronically Signed   By: Lavonia Dana M.D.   On: 12/27/2020 16:38    EKG: Independently reviewed.  A. fib with RVR.  Assessment/Plan Principal Problem:   Atrial fibrillation with RVR (HCC) Active Problems:   Elevated troponin    A. fib with RVR presently converted back to sinus rhythm.  Patient CHA2DS2-VASc  score is 2.  We will keep patient on heparin for now.  If no cardiac intervention is planned then changed to Eliquis.  Check TSH cardiac markers 2D echo. Elevated troponin likely related to tachycardia.  Denies any chest pain.  Will trend cardiac markers check 2D echo keep patient on aspirin for now. Hyperlipidemia on statins.   DVT prophylaxis: Heparin. Code Status: Full code. Family Communication: Discussed with patient. Disposition Plan: Home. Consults called: ER physician discussed with cardiologist. Admission status: Observation.   Rise Patience MD [facility] Pager [phone].  If 7PM-7AM, please contact night-coverage [email or web address] Password [facility]  12/28/2020, 12:58 AM

## 2020-12-28 NOTE — TOC Transition Note (Signed)
Transition of Care [facility]) - CM/SW Discharge Note   Patient Details  Name: Brittany Harper MRN: [id number] Date of Birth: 05-Jul-1945  [facility] CM/SW Contact:  Bartholomew Crews, RN Phone Number: [phone] 12/28/2020, 2:20 PM   Clinical Narrative:     Notified by nursing that patient unable to find transportation home. [employer] teacher. Demographics verified. Uber ride arranged.   Final next level of care: Home/Self Care Barriers to Discharge: No Barriers Identified   Patient Goals and CMS Choice Patient states their goals for this hospitalization and ongoing recovery are:: return home CMS Medicare.gov Compare Post Acute Care list provided to:: Patient Choice offered to / list presented to : NA  Discharge Placement                       Discharge Plan and Services                                     Social Determinants of Health (SDOH) Interventions     Readmission Risk Interventions No flowsheet data found.

## 2020-12-28 NOTE — Plan of Care (Signed)
Initiation of Care Plan  Problem: Education: Goal: Knowledge of General Education information will improve Description: Including pain rating scale, medication(s)/side effects and non-pharmacologic comfort measures Outcome: Progressing   Problem: Health Behavior/Discharge Planning: Goal: Ability to manage health-related needs will improve Outcome: Progressing   Problem: Clinical Measurements: Goal: Ability to maintain clinical measurements within normal limits will improve Outcome: Progressing Goal: Will remain free from infection Outcome: Progressing Goal: Diagnostic test results will improve Outcome: Progressing Goal: Respiratory complications will improve Outcome: Progressing Goal: Cardiovascular complication will be avoided Outcome: Progressing   Problem: Activity: Goal: Risk for activity intolerance will decrease Outcome: Progressing   Problem: Nutrition: Goal: Adequate nutrition will be maintained Outcome: Progressing   Problem: Coping: Goal: Level of anxiety will decrease Outcome: Progressing   Problem: Elimination: Goal: Will not experience complications related to bowel motility Outcome: Progressing Goal: Will not experience complications related to urinary retention Outcome: Progressing   Problem: Pain Managment: Goal: General experience of comfort will improve Outcome: Progressing   Problem: Safety: Goal: Ability to remain free from injury will improve Outcome: Progressing   Problem: Skin Integrity: Goal: Risk for impaired skin integrity will decrease Outcome: Progressing   Problem: Education: Goal: Knowledge of disease or condition will improve Outcome: Progressing Goal: Understanding of medication regimen will improve Outcome: Progressing Goal: Individualized Educational Video(s) Outcome: Progressing   Problem: Activity: Goal: Ability to tolerate increased activity will improve Outcome: Progressing   Problem: Cardiac: Goal: Ability to  achieve and maintain adequate cardiopulmonary perfusion will improve Outcome: Progressing   Problem: Health Behavior/Discharge Planning: Goal: Ability to safely manage health-related needs after discharge will improve Outcome: Progressing

## 2020-12-28 NOTE — Discharge Summary (Signed)
PATIENT DETAILS Name: Brittany Harper Age: 75 y.o. Sex: female Date of Birth: 10-15-1945 MRN: [id number]. Admitting Physician: Rise Patience, MD JSE:GBTDV, Coralee Pesa, NP  Admit Date: 12/27/2020 Discharge date: 12/28/2020  Recommendations for Outpatient Follow-up:  Follow up with PCP in 1-2 weeks Please obtain CMP/CBC in one week Please ensure follow-up with cardiology  Admitted From:  Home  Disposition: [place]: No  Equipment/Devices: None  Discharge Condition: Stable  CODE STATUS: FULL CODE  Diet recommendation:  Diet Order             Diet Heart Room service appropriate? Yes; Fluid consistency: Thin  Diet effective now           Diet - low sodium heart healthy                    Brief Summary: See H&P, Labs, Consult and Test reports for all details in brief, patient is a 75 year old female with history of HLD-brought to the ED for palpitations-found to have A. fib with RVR.  Brief Hospital Course: PAF with RVR: Spontaneously converted to sinus rhythm-CHA2DS2-VASc of 3-consulted cardiology-recommendations are to continue metoprolol/Multaq and Eliquis on discharge.  Echo with stable EF and no major abnormalities.  Cardiology will continue to follow in the outpatient setting for further needful.  Minimally elevated troponin: Due to demand ischemia-trend is flat-clinical symptoms are not suggestive of MI.  Cardiology planning on outpatient stress testing.  HLD: Continue statin.  Obesity: Estimated body mass index is 24.03 kg/m as calculated from the following:   Height as of this encounter: 5\' 4"  (1.626 m).   Weight as of this encounter: 63.5 kg.    Procedures None  Discharge Diagnoses:  Principal Problem:   Atrial fibrillation with RVR (HCC) Active Problems:   Elevated troponin  Discharge Instructions:  Activity:  As tolerated   Discharge Instructions     Diet - low sodium heart healthy   Complete by: As directed     Discharge instructions   Complete by: As directed    Follow with Primary MD  Early, Coralee Pesa, NP in 1-2 weeks  Follow-up with Dr. Patwardhan-cardiology as instructed.  Please get a complete blood count and chemistry panel checked by your Primary MD at your next visit, and again as instructed by your Primary MD.  Get Medicines reviewed and adjusted: Please take all your medications with you for your next visit with your Primary MD  Laboratory/radiological data: Please request your Primary MD to go over all hospital tests and procedure/radiological results at the follow up, please ask your Primary MD to get all Hospital records sent to his/her office.  In some cases, they will be blood work, cultures and biopsy results pending at the time of your discharge. Please request that your primary care M.D. follows up on these results.  Also Note the following: If you experience worsening of your admission symptoms, develop shortness of breath, life threatening emergency, suicidal or homicidal thoughts you must seek medical attention immediately by calling 911 or calling your MD immediately  if symptoms less severe.  You must read complete instructions/literature along with all the possible adverse reactions/side effects for all the Medicines you take and that have been prescribed to you. Take any new Medicines after you have completely understood and accpet all the possible adverse reactions/side effects.   Do not drive when taking Pain medications or sleeping medications (Benzodaizepines)  Do not take more than prescribed Pain, Sleep and  Anxiety Medications. It is not advisable to combine anxiety,sleep and pain medications without talking with your primary care practitioner  Special Instructions: If you have smoked or chewed Tobacco  in the last 2 yrs please stop smoking, stop any regular Alcohol  and or any Recreational drug use.  Wear Seat belts while driving.  Please note: You were cared for  by a hospitalist during your hospital stay. Once you are discharged, your primary care physician will handle any further medical issues. Please note that NO REFILLS for any discharge medications will be authorized once you are discharged, as it is imperative that you return to your primary care physician (or establish a relationship with a primary care physician if you do not have one) for your post hospital discharge needs so that they can reassess your need for medications and monitor your lab values.   Increase activity slowly   Complete by: As directed       Allergies as of 12/28/2020       Reactions   Ivp Dye [iodinated Contrast Media] Shortness Of Breath   Feels hot   Augmentin [amoxicillin-pot Clavulanate] Nausea And Vomiting   Did it involve swelling of the face/tongue/throat, SOB, or low BP? No Did it involve sudden or severe rash/hives, skin peeling, or any reaction on the inside of your mouth or nose? No Did you need to seek medical attention at a hospital or doctor's office? No When did it last happen?      20+ years If all above answers are NO, may proceed with cephalosporin use.   Demerol [meperidine] Nausea And Vomiting   Morphine And Related Nausea And Vomiting   Shellfish Allergy         Medication List     STOP taking these medications    ibuprofen 200 MG tablet Commonly known as: ADVIL   Moderna COVID-19 Vaccine 100 MCG/0.5ML injection Generic drug: COVID-19 mRNA vaccine (Moderna)       TAKE these medications    AMBULATORY NON FORMULARY MEDICATION Take 1 tablet by mouth 2 (two) times daily. Viviscal otc supplement   AMBULATORY NON FORMULARY MEDICATION Apply 1 application topically daily. Hempvanna lotion   apixaban 5 MG Tabs tablet Commonly known as: ELIQUIS Take 1 tablet (5 mg total) by mouth 2 (two) times daily.   Azelaic Acid 15 % gel After skin is thoroughly washed and patted dry, gently but thoroughly massage a thin film of azelaic acid  cream into the affected area twice daily, in the morning and evening. What changed:  how much to take how to take this when to take this additional instructions   Calcium 1200 1200-1000 MG-UNIT Chew Chew 2 tablets by mouth daily.   CENTRUM SILVER ULTRA WOMENS PO Take 1 tablet by mouth daily at 12 noon.   dronedarone 400 MG tablet Commonly known as: MULTAQ Take 1 tablet (400 mg total) by mouth 2 (two) times daily with a meal.   fexofenadine 180 MG tablet Commonly known as: ALLEGRA Take 180 mg by mouth daily.   metoprolol tartrate 25 MG tablet Commonly known as: LOPRESSOR Take 1 tablet (25 mg total) by mouth 2 (two) times daily.   metroNIDAZOLE 0.75 % cream Commonly known as: METROCREAM Apply 1 application topically daily.   omeprazole 40 MG capsule Commonly known as: PRILOSEC Take 1 capsule (40 mg total) by mouth daily.   OSTEO BI-FLEX ADV JOINT SHIELD PO Take 2 tablets by mouth daily with lunch.   Premarin vaginal cream Generic drug:  conjugated estrogens PLACE 1 APPLICATORFUL VAGINALLY ONCE A WEEK. What changed:  how much to take how to take this when to take this additional instructions   simvastatin 5 MG tablet Commonly known as: ZOCOR Take 1 tablet (5 mg total) by mouth daily.   Systane 0.4-0.3 % Soln Generic drug: Polyethyl Glycol-Propyl Glycol Place 1 drop into both eyes every morning.   Systane Overnight Therapy 0.3 % Gel ophthalmic ointment Generic drug: hypromellose Place 1 application into both eyes at bedtime.        Follow-up Information     Early, Coralee Pesa, NP. Schedule an appointment as soon as possible for a visit in 1 week(s).   Specialty: Nurse Practitioner Contact information: [address] [place] [address] [phone]         Nigel Mormon, MD Follow up.   Specialties: Cardiology, Radiology Why: Office will call with date/time, If you dont hear from them,please give them a call Contact  information: [place] [address] [phone]                Allergies  Allergen Reactions   Ivp Dye [Iodinated Contrast Media] Shortness Of Breath    Feels hot   Augmentin [Amoxicillin-Pot Clavulanate] Nausea And Vomiting    Did it involve swelling of the face/tongue/throat, SOB, or low BP? No Did it involve sudden or severe rash/hives, skin peeling, or any reaction on the inside of your mouth or nose? No Did you need to seek medical attention at a hospital or doctor's office? No When did it last happen?      20+ years If all above answers are NO, may proceed with cephalosporin use.    Demerol [Meperidine] Nausea And Vomiting   Morphine And Related Nausea And Vomiting   Shellfish Allergy       Consultations:  cardiology   Other Procedures/Studies: DG Chest 2 View  Result Date: 12/27/2020 CLINICAL DATA:  Dizziness, tachycardia, chest pain, arrhythmia and vomiting for 4 hours EXAM: CHEST - 2 VIEW COMPARISON:  None FINDINGS: Normal heart size, mediastinal contours, and pulmonary vascularity. Minimal subsegmental atelectasis at lung bases. Lungs otherwise clear. No pulmonary infiltrate, pleural effusion, or pneumothorax. Osseous structures unremarkable. IMPRESSION: Minimal bibasilar atelectasis. Electronically Signed   By: Lavonia Dana M.D.   On: 12/27/2020 16:38   ECHOCARDIOGRAM COMPLETE  Result Date: 12/28/2020    ECHOCARDIOGRAM REPORT   Patient Name:   LAQUINDA MOLLER Date of Exam: 12/28/2020 Medical Rec #:  [id number]    Height:       64.0 in Accession #:    [id number]   Weight:       140.0 lb Date of Birth:  Nov 23, 1945   BSA:          1.681 m Patient Age:    39 years     BP:           125/69 mmHg Patient Gender: F            HR:           63 bpm. Exam Location:  Inpatient Procedure: 2D Echo, Cardiac Doppler and Color Doppler Indications:     Atrial fibrillation  History:         Patient has no prior history of Echocardiogram examinations.                   Risk Factors:Dyslipidemia.  Sonographer:     Clayton Lefort RDCS (AE) Referring Phys:  [place] Phys: Vernell Leep MD IMPRESSIONS  1. Left ventricular ejection fraction, by estimation, is 65 to 70%. The left ventricle has normal function. The left ventricle has no regional wall motion abnormalities. Left ventricular diastolic parameters were normal.  2. Right ventricular systolic function is normal. The right ventricular size is normal. There is normal pulmonary artery systolic pressure.  3. Mild tricuspid regurgitation. No pulmonary hypertension. FINDINGS  Left Ventricle: Left ventricular ejection fraction, by estimation, is 65 to 70%. The left ventricle has normal function. The left ventricle has no regional wall motion abnormalities. The left ventricular internal cavity size was normal in size. There is  no left ventricular hypertrophy. Left ventricular diastolic parameters were normal. Right Ventricle: The right ventricular size is normal. No increase in right ventricular wall thickness. Right ventricular systolic function is normal. There is normal pulmonary artery systolic pressure. The tricuspid regurgitant velocity is 2.49 m/s, and  with an assumed right atrial pressure of 3 mmHg, the estimated right ventricular systolic pressure is 75.1 mmHg. Left Atrium: Left atrial size was normal in size. Right Atrium: Right atrial size was normal in size. Pericardium: There is no evidence of pericardial effusion. Mitral Valve: The mitral valve is normal in structure. No evidence of mitral valve regurgitation. No evidence of mitral valve stenosis. MV peak gradient, 2.8 mmHg. The mean mitral valve gradient is 1.0 mmHg. Tricuspid Valve: The tricuspid valve is normal in structure. Tricuspid valve regurgitation is mild. Aortic Valve: The aortic valve is normal in structure. Aortic valve regurgitation is not visualized. No aortic stenosis is present. Aortic valve mean gradient  measures 4.0 mmHg. Aortic valve peak gradient measures 7.7 mmHg. Aortic valve area, by VTI measures 1.55 cm. Pulmonic Valve: The pulmonic valve was normal in structure. Pulmonic valve regurgitation is not visualized. No evidence of pulmonic stenosis. Aorta: The aortic root is normal in size and structure. IAS/Shunts: The interatrial septum was not assessed.  LEFT VENTRICLE PLAX 2D LVIDd:         4.50 cm   Diastology LVIDs:         2.80 cm   LV e' medial:    9.79 cm/s LV PW:         1.20 cm   LV E/e' medial:  8.3 LV IVS:        1.00 cm   LV e' lateral:   12.40 cm/s LVOT diam:     1.60 cm   LV E/e' lateral: 6.5 LV SV:         48 LV SV Index:   29 LVOT Area:     2.01 cm  RIGHT VENTRICLE             IVC RV Basal diam:  3.00 cm     IVC diam: 1.90 cm RV S prime:     13.60 cm/s TAPSE (M-mode): 2.4 cm LEFT ATRIUM             Index        RIGHT ATRIUM           Index LA diam:        3.80 cm 2.26 cm/m   RA Area:     14.20 cm LA Vol (A2C):   54.9 ml 32.66 ml/m  RA Volume:   34.90 ml  20.76 ml/m LA Vol (A4C):   42.6 ml 25.34 ml/m LA Biplane Vol: 50.7 ml 30.16 ml/m  AORTIC VALVE AV Area (Vmax):    1.62 cm AV Area (  Vmean):   1.52 cm AV Area (VTI):     1.55 cm AV Vmax:           139.00 cm/s AV Vmean:          95.800 cm/s AV VTI:            0.312 m AV Peak Grad:      7.7 mmHg AV Mean Grad:      4.0 mmHg LVOT Vmax:         112.00 cm/s LVOT Vmean:        72.300 cm/s LVOT VTI:          0.241 m LVOT/AV VTI ratio: 0.77  AORTA Ao Root diam: 2.70 cm Ao Asc diam:  3.10 cm MITRAL VALVE               TRICUSPID VALVE MV Area (PHT): 3.31 cm    TR Peak grad:   24.8 mmHg MV Area VTI:   1.65 cm    TR Vmax:        249.00 cm/s MV Peak grad:  2.8 mmHg MV Mean grad:  1.0 mmHg    SHUNTS MV Vmax:       0.83 m/s    Systemic VTI:  0.24 m MV Vmean:      45.7 cm/s   Systemic Diam: 1.60 cm MV Decel Time: 229 msec MV E velocity: 81.20 cm/s MV A velocity: 68.70 cm/s MV E/A ratio:  1.18 Manish Patwardhan MD Electronically signed by Vernell Leep MD Signature Date/Time: 12/28/2020/11:30:16 AM    Final      TODAY-DAY OF DISCHARGE:  Subjective:   Erasmo Leventhal today has no headache,no chest abdominal pain,no new weakness tingling or numbness, feels much better wants to go home today.   Objective:   Blood pressure 125/69, pulse 79, temperature 98.2 F (36.8 C), temperature source Oral, resp. rate 15, height 5\' 4"  (1.626 m), weight 63.5 kg, SpO2 95 %.  Intake/Output Summary (Last 24 hours) at 12/28/2020 1155 Last data filed at 12/28/2020 0300 Gross per 24 hour  Intake 1037.93 ml  Output --  Net 1037.93 ml   Filed Weights   12/27/20 1537 12/27/20 2359  Weight: 63.5 kg 63.5 kg    Exam: Awake Alert, Oriented *3, No new F.N deficits, Normal affect [place].AT,PERRAL Supple Neck,No JVD, No cervical lymphadenopathy appriciated.  Symmetrical Chest wall movement, Good air movement bilaterally, CTAB RRR,No Gallops,Rubs or new Murmurs, No Parasternal Heave +ve B.Sounds, Abd Soft, Non tender, No organomegaly appriciated, No rebound -guarding or rigidity. No Cyanosis, Clubbing or edema, No new Rash or bruise   PERTINENT RADIOLOGIC STUDIES: DG Chest 2 View  Result Date: 12/27/2020 CLINICAL DATA:  Dizziness, tachycardia, chest pain, arrhythmia and vomiting for 4 hours EXAM: CHEST - 2 VIEW COMPARISON:  None FINDINGS: Normal heart size, mediastinal contours, and pulmonary vascularity. Minimal subsegmental atelectasis at lung bases. Lungs otherwise clear. No pulmonary infiltrate, pleural effusion, or pneumothorax. Osseous structures unremarkable. IMPRESSION: Minimal bibasilar atelectasis. Electronically Signed   By: Lavonia Dana M.D.   On: 12/27/2020 16:38   ECHOCARDIOGRAM COMPLETE  Result Date: 12/28/2020    ECHOCARDIOGRAM REPORT   Patient Name:   ISMERAI BIN Date of Exam: 12/28/2020 Medical Rec #:  [id number]    Height:       64.0 in Accession #:    [id number]   Weight:       140.0 lb Date of Birth:  [birth year]   BSA:  1.681 m Patient Age:    43 years     BP:           125/69 mmHg Patient Gender: F            HR:           63 bpm. Exam Location:  Inpatient Procedure: 2D Echo, Cardiac Doppler and Color Doppler Indications:     Atrial fibrillation  History:         Patient has no prior history of Echocardiogram examinations.                  Risk Factors:Dyslipidemia.  Sonographer:     Clayton Lefort RDCS (AE) Referring Phys:  [place] Diagnosing Phys: Vernell Leep MD IMPRESSIONS  1. Left ventricular ejection fraction, by estimation, is 65 to 70%. The left ventricle has normal function. The left ventricle has no regional wall motion abnormalities. Left ventricular diastolic parameters were normal.  2. Right ventricular systolic function is normal. The right ventricular size is normal. There is normal pulmonary artery systolic pressure.  3. Mild tricuspid regurgitation. No pulmonary hypertension. FINDINGS  Left Ventricle: Left ventricular ejection fraction, by estimation, is 65 to 70%. The left ventricle has normal function. The left ventricle has no regional wall motion abnormalities. The left ventricular internal cavity size was normal in size. There is  no left ventricular hypertrophy. Left ventricular diastolic parameters were normal. Right Ventricle: The right ventricular size is normal. No increase in right ventricular wall thickness. Right ventricular systolic function is normal. There is normal pulmonary artery systolic pressure. The tricuspid regurgitant velocity is 2.49 m/s, and  with an assumed right atrial pressure of 3 mmHg, the estimated right ventricular systolic pressure is 56.3 mmHg. Left Atrium: Left atrial size was normal in size. Right Atrium: Right atrial size was normal in size. Pericardium: There is no evidence of pericardial effusion. Mitral Valve: The mitral valve is normal in structure. No evidence of mitral valve regurgitation. No evidence of mitral valve stenosis. MV peak gradient, 2.8  mmHg. The mean mitral valve gradient is 1.0 mmHg. Tricuspid Valve: The tricuspid valve is normal in structure. Tricuspid valve regurgitation is mild. Aortic Valve: The aortic valve is normal in structure. Aortic valve regurgitation is not visualized. No aortic stenosis is present. Aortic valve mean gradient measures 4.0 mmHg. Aortic valve peak gradient measures 7.7 mmHg. Aortic valve area, by VTI measures 1.55 cm. Pulmonic Valve: The pulmonic valve was normal in structure. Pulmonic valve regurgitation is not visualized. No evidence of pulmonic stenosis. Aorta: The aortic root is normal in size and structure. IAS/Shunts: The interatrial septum was not assessed.  LEFT VENTRICLE PLAX 2D LVIDd:         4.50 cm   Diastology LVIDs:         2.80 cm   LV e' medial:    9.79 cm/s LV PW:         1.20 cm   LV E/e' medial:  8.3 LV IVS:        1.00 cm   LV e' lateral:   12.40 cm/s LVOT diam:     1.60 cm   LV E/e' lateral: 6.5 LV SV:         48 LV SV Index:   29 LVOT Area:     2.01 cm  RIGHT VENTRICLE             IVC RV Basal diam:  3.00 cm     IVC  diam: 1.90 cm RV S prime:     13.60 cm/s TAPSE (M-mode): 2.4 cm LEFT ATRIUM             Index        RIGHT ATRIUM           Index LA diam:        3.80 cm 2.26 cm/m   RA Area:     14.20 cm LA Vol (A2C):   54.9 ml 32.66 ml/m  RA Volume:   34.90 ml  20.76 ml/m LA Vol (A4C):   42.6 ml 25.34 ml/m LA Biplane Vol: 50.7 ml 30.16 ml/m  AORTIC VALVE AV Area (Vmax):    1.62 cm AV Area (Vmean):   1.52 cm AV Area (VTI):     1.55 cm AV Vmax:           139.00 cm/s AV Vmean:          95.800 cm/s AV VTI:            0.312 m AV Peak Grad:      7.7 mmHg AV Mean Grad:      4.0 mmHg LVOT Vmax:         112.00 cm/s LVOT Vmean:        72.300 cm/s LVOT VTI:          0.241 m LVOT/AV VTI ratio: 0.77  AORTA Ao Root diam: 2.70 cm Ao Asc diam:  3.10 cm MITRAL VALVE               TRICUSPID VALVE MV Area (PHT): 3.31 cm    TR Peak grad:   24.8 mmHg MV Area VTI:   1.65 cm    TR Vmax:        249.00 cm/s MV  Peak grad:  2.8 mmHg MV Mean grad:  1.0 mmHg    SHUNTS MV Vmax:       0.83 m/s    Systemic VTI:  0.24 m MV Vmean:      45.7 cm/s   Systemic Diam: 1.60 cm MV Decel Time: 229 msec MV E velocity: 81.20 cm/s MV A velocity: 68.70 cm/s MV E/A ratio:  1.18 Manish Patwardhan MD Electronically signed by Vernell Leep MD Signature Date/Time: 12/28/2020/11:30:16 AM    Final      PERTINENT LAB RESULTS: CBC: Recent Labs    12/27/20 1540 12/28/20 0351  WBC 11.3* 13.2*  HGB 15.5* 13.2  HCT 47.3* 40.3  PLT 230 230   CMET CMP     Component Value Date/Time   NA 138 12/28/2020 0351   K 3.7 12/28/2020 0351   CL 104 12/28/2020 0351   CO2 24 12/28/2020 0351   GLUCOSE 106 (H) 12/28/2020 0351   BUN 12 12/28/2020 0351   CREATININE 0.62 12/28/2020 0351   CALCIUM 8.5 (L) 12/28/2020 0351   PROT 6.8 03/31/2020 1000   ALBUMIN 4.5 03/31/2020 1000   AST 15 03/31/2020 1000   ALT 13 03/31/2020 1000   ALKPHOS 55 03/31/2020 1000   BILITOT 0.4 03/31/2020 1000   GFRNONAA >60 12/28/2020 0351    GFR Estimated Creatinine Clearance: 50.9 mL/min (by C-G formula based on SCr of 0.62 mg/dL). No results for input(s): LIPASE, AMYLASE in the last 72 hours. No results for input(s): CKTOTAL, CKMB, CKMBINDEX, TROPONINI in the last 72 hours. Invalid input(s): POCBNP No results for input(s): DDIMER in the last 72 hours. No results for input(s): HGBA1C in the last 72 hours. No results for input(s): CHOL, HDL,  LDLCALC, TRIG, CHOLHDL, LDLDIRECT in the last 72 hours. Recent Labs    12/28/20 0351  TSH 1.149   No results for input(s): VITAMINB12, FOLATE, FERRITIN, TIBC, IRON, RETICCTPCT in the last 72 hours. Coags: No results for input(s): INR in the last 72 hours.  Invalid input(s): PT Microbiology: Recent Results (from the past 240 hour(s))  Resp Panel by RT-PCR (Flu A&B, Covid) Nasopharyngeal Swab     Status: None   Collection Time: 12/27/20  3:41 PM   Specimen: Nasopharyngeal Swab; Nasopharyngeal(NP) swabs in  vial transport medium  Result Value Ref Range Status   SARS Coronavirus 2 by RT PCR NEGATIVE NEGATIVE Final    Comment: (NOTE) SARS-CoV-2 target nucleic acids are NOT DETECTED.  The SARS-CoV-2 RNA is generally detectable in upper respiratory specimens during the acute phase of infection. The lowest concentration of SARS-CoV-2 viral copies this assay can detect is 138 copies/mL. A negative result does not preclude SARS-Cov-2 infection and should not be used as the sole basis for treatment or other patient management decisions. A negative result may occur with  improper specimen collection/handling, submission of specimen other than nasopharyngeal swab, presence of viral mutation(s) within the areas targeted by this assay, and inadequate number of viral copies(<138 copies/mL). A negative result must be combined with clinical observations, patient history, and epidemiological information. The expected result is Negative.  Fact Sheet for Patients:  EntrepreneurPulse.com.au  Fact Sheet for Healthcare Providers:  IncredibleEmployment.be  This test is no t yet approved or cleared by the [place] FDA and  has been authorized for detection and/or diagnosis of SARS-CoV-2 by FDA under an Emergency Use Authorization (EUA). This EUA will remain  in effect (meaning this test can be used) for the duration of the COVID-19 declaration under Section 564(b)(1) of the Act, 21 U.S.C.section 360bbb-3(b)(1), unless the authorization is terminated  or revoked sooner.       Influenza A by PCR NEGATIVE NEGATIVE Final   Influenza B by PCR NEGATIVE NEGATIVE Final    Comment: (NOTE) The Xpert Xpress SARS-CoV-2/FLU/RSV plus assay is intended as an aid in the diagnosis of influenza from Nasopharyngeal swab specimens and should not be used as a sole basis for treatment. Nasal washings and aspirates are unacceptable for Xpert Xpress SARS-CoV-2/FLU/RSV testing.  Fact  Sheet for Patients: EntrepreneurPulse.com.au  Fact Sheet for Healthcare Providers: IncredibleEmployment.be  This test is not yet approved or cleared by the [place] FDA and has been authorized for detection and/or diagnosis of SARS-CoV-2 by FDA under an Emergency Use Authorization (EUA). This EUA will remain in effect (meaning this test can be used) for the duration of the COVID-19 declaration under Section 564(b)(1) of the Act, 21 U.S.C. section 360bbb-3(b)(1), unless the authorization is terminated or revoked.  Performed at [facility], [address] [place], [place] [address]   MRSA Next Gen by PCR, Nasal     Status: None   Collection Time: 12/28/20 12:20 AM   Specimen: Nasal Mucosa; Nasal Swab  Result Value Ref Range Status   MRSA by PCR Next Gen NOT DETECTED NOT DETECTED Final    Comment: (NOTE) The GeneXpert MRSA Assay (FDA approved for NASAL specimens only), is one component of a comprehensive MRSA colonization surveillance program. It is not intended to diagnose MRSA infection nor to guide or monitor treatment for MRSA infections. Test performance is not FDA approved in patients less than 21 years old. Performed at [place] [facility], [facility] [address] [place], [place] [address]  FURTHER DISCHARGE INSTRUCTIONS:  Get Medicines reviewed and adjusted: Please take all your medications with you for your next visit with your Primary MD  Laboratory/radiological data: Please request your Primary MD to go over all hospital tests and procedure/radiological results at the follow up, please ask your Primary MD to get all Hospital records sent to his/her office.  In some cases, they will be blood work, cultures and biopsy results pending at the time of your discharge. Please request that your primary care M.D. goes through all the records of your hospital data and follows up on these results.  Also Note the  following: If you experience worsening of your admission symptoms, develop shortness of breath, life threatening emergency, suicidal or homicidal thoughts you must seek medical attention immediately by calling 911 or calling your MD immediately  if symptoms less severe.  You must read complete instructions/literature along with all the possible adverse reactions/side effects for all the Medicines you take and that have been prescribed to you. Take any new Medicines after you have completely understood and accpet all the possible adverse reactions/side effects.   Do not drive when taking Pain medications or sleeping medications (Benzodaizepines)  Do not take more than prescribed Pain, Sleep and Anxiety Medications. It is not advisable to combine anxiety,sleep and pain medications without talking with your primary care practitioner  Special Instructions: If you have smoked or chewed Tobacco  in the last 2 yrs please stop smoking, stop any regular Alcohol  and or any Recreational drug use.  Wear Seat belts while driving.  Please note: You were cared for by a hospitalist during your hospital stay. Once you are discharged, your primary care physician will handle any further medical issues. Please note that NO REFILLS for any discharge medications will be authorized once you are discharged, as it is imperative that you return to your primary care physician (or establish a relationship with a primary care physician if you do not have one) for your post hospital discharge needs so that they can reassess your need for medications and monitor your lab values.  Total Time spent coordinating discharge including counseling, education and face to face time equals 35 minutes.  SignedOren Binet 12/28/2020 11:55 AM

## 2020-12-28 NOTE — Progress Notes (Signed)
ANTICOAGULATION CONSULT NOTE - Follow Up Consult  Pharmacy Consult for heparin > apixaban Indication: atrial fibrillation  Allergies  Allergen Reactions   Ivp Dye [Iodinated Contrast Media] Shortness Of Breath    Feels hot   Augmentin [Amoxicillin-Pot Clavulanate] Nausea And Vomiting    Did it involve swelling of the face/tongue/throat, SOB, or low BP? No Did it involve sudden or severe rash/hives, skin peeling, or any reaction on the inside of your mouth or nose? No Did you need to seek medical attention at a hospital or doctor's office? No When did it last happen?      20+ years If all above answers are NO, may proceed with cephalosporin use.    Demerol [Meperidine] Nausea And Vomiting   Morphine And Related Nausea And Vomiting   Shellfish Allergy     Patient Measurements: Height: 5\' 4"  (162.6 cm) Weight: 63.5 kg (139 lb 15.9 oz) IBW/kg (Calculated) : 54.7  Vital Signs: Temp: 98.2 F (36.8 C) (12/25 0820) Temp Source: Oral (12/25 0820) BP: 125/69 (12/25 0820) Pulse Rate: 79 (12/25 0820)  Labs: Recent Labs    12/27/20 1540 12/27/20 1740 12/28/20 0351 12/28/20 0716 12/28/20 1049  HGB 15.5*  --  13.2  --   --   HCT 47.3*  --  40.3  --   --   PLT 230  --  230  --   --   HEPARINUNFRC  --   --   --   --  0.36  CREATININE 0.95  --  0.62  --   --   TROPONINIHS 38* 100* 174* 177*  --      Estimated Creatinine Clearance: 50.9 mL/min (by C-G formula based on SCr of 0.62 mg/dL).   Medical History: Past Medical History:  Diagnosis Date   Allergy    Arthritis    Asthma    Cancer ([place])    basal cell   Gastric ulcer without hemorrhage or perforation    GERD (gastroesophageal reflux disease)    Hiatal hernia with GERD and esophagitis 10/16/2018   Hx of skin cancer, basal cell    Hyperlipidemia    Memory change 04/05/2016    Medications:  Medications Prior to Admission  Medication Sig Dispense Refill Last Dose   AMBULATORY NON FORMULARY MEDICATION Take 1 tablet  by mouth 2 (two) times daily. Viviscal otc supplement   12/26/2020   AMBULATORY NON FORMULARY MEDICATION Apply 1 application topically daily. Hempvanna lotion   12/26/2020   Azelaic Acid 15 % cream After skin is thoroughly washed and patted dry, gently but thoroughly massage a thin film of azelaic acid cream into the affected area twice daily, in the morning and evening. (Patient taking differently: Apply 1 application topically daily. After skin is thoroughly washed and patted dry, gently but thoroughly massage a thin film of azelaic acid cream into the affected area) 150 g 3 12/27/2020   Calcium Carbonate-Vit D-Min (CALCIUM 1200) 1200-1000 MG-UNIT CHEW Chew 2 tablets by mouth daily.   12/26/2020   conjugated estrogens (PREMARIN) vaginal cream PLACE 1 APPLICATORFUL VAGINALLY ONCE A WEEK. (Patient taking differently: Place 1 applicator vaginally once a week. Tuesdays) 30 g 11 12/23/2020   fexofenadine (ALLEGRA) 180 MG tablet Take 180 mg by mouth daily.   12/26/2020   hypromellose (SYSTANE OVERNIGHT THERAPY) 0.3 % GEL ophthalmic ointment Place 1 application into both eyes at bedtime.   12/26/2020   ibuprofen (ADVIL) 200 MG tablet Take 200 mg by mouth every 8 (eight) hours as needed for  headache or moderate pain.   Past Week   metroNIDAZOLE (METROCREAM) 0.75 % cream Apply 1 application topically daily.   12/26/2020   Misc Natural Products (OSTEO BI-FLEX ADV JOINT SHIELD PO) Take 2 tablets by mouth daily with lunch.    12/26/2020   Multiple Vitamins-Minerals (CENTRUM SILVER ULTRA WOMENS PO) Take 1 tablet by mouth daily at 12 noon.    12/26/2020   omeprazole (PRILOSEC) 40 MG capsule Take 1 capsule (40 mg total) by mouth daily. 90 capsule 3 12/26/2020   Polyethyl Glycol-Propyl Glycol (SYSTANE) 0.4-0.3 % SOLN Place 1 drop into both eyes every morning.   12/26/2020   simvastatin (ZOCOR) 5 MG tablet Take 1 tablet (5 mg total) by mouth daily. 90 tablet 3 12/26/2020   COVID-19 mRNA vaccine, Moderna, 100  MCG/0.5ML injection Inject into the muscle. 0.25 mL 0    Scheduled:   apixaban  5 mg Oral BID   dronedarone  400 mg Oral BID WC   metoprolol tartrate  25 mg Oral BID   simvastatin  5 mg Oral Daily   sodium chloride flush  3 mL Intravenous Q12H    Assessment: 75yo female c/o racing heart and N/V >> found to be in Afib, to begin heparin. Heparin drip 900 uts/hr Heparin level 0.3 at goal  Plan to change to apixaban  Wt >60kg age < 80yo Cr < 1.5 CBC stable   Goal of Therapy:  Heparin level 0.3-0.7 units/ml Monitor platelets by anticoagulation protocol: Yes   Plan:  Stop heparin drip  Apixaban 5mg  BID    Bonnita Nasuti Pharm.D. CPP, BCPS Clinical Pharmacist [phone] 12/28/2020 12:42 PM

## 2020-12-28 NOTE — Consult Note (Signed)
CARDIOLOGY CONSULT NOTE  Patient ID: Brittany Harper MRN: [id number] DOB/AGE: 77-Apr-1947 77 y.o.  Admit date: 12/27/2020 Referring Physician: [facility] Reason for Consultation:  Afib  HPI:   75 y.o. Caucasian female  with controlled hyperlipidemia, admitted with an episode of Afib w/RVR.  Patient is widowed, lives in [place], has four children living across the [place]. She is very active at baseline with regular exercise, walking, line dancing etc. At baseline, she denies chest pain, shortness of breath, leg edema, orthopnea, PND, TIA/syncope. She ate her chicken pot pie lunch at [facility] yesterday. About 20 min later, she started feeling palpitations and hot flushes. She has had similar episodes before, always after eating something (known to have had similar episode after eating shellfish), that usually resolves itself within a few min after "keeping ice on her chest". She has previously attributed this to being a food allergy or reaction. However, yesterday's episode lasted for about an hour and was associated with lightheadedness and unsteadiness when she tried to stand up. She was thus taken to [place] at [facility]. She self converted to sinus rhythm after reaching there and has been in sinus rhythm since then. Her trop increased 38-->100-->174-->177. She was thus admitted at [facility]. She has been completely asymptomatic while here.    Past Medical History:  Diagnosis Date   Allergy    Arthritis    Asthma    Cancer ([place])    basal cell   Gastric ulcer without hemorrhage or perforation    GERD (gastroesophageal reflux disease)    Hiatal hernia with GERD and esophagitis 10/16/2018   Hx of skin cancer, basal cell    Hyperlipidemia    Memory change 04/05/2016     Past Surgical History:  Procedure Laterality Date   ABDOMINAL HYSTERECTOMY     BIOPSY  10/16/2018   Procedure: BIOPSY;  Surgeon: Lavena Bullion, DO;  Location: [facility];  Service:  Gastroenterology;;   ESOPHAGOGASTRODUODENOSCOPY (EGD) WITH PROPOFOL N/A 10/16/2018   Procedure: ESOPHAGOGASTRODUODENOSCOPY (EGD) WITH PROPOFOL;  Surgeon: Lavena Bullion, DO;  Location: [facility];  Service: Gastroenterology;  Laterality: N/A;   TRANSORAL INCISIONLESS FUNDOPLICATION N/A 24/23/5361   Procedure: TRANSORAL INCISIONLESS FUNDOPLICATION;  Surgeon: Lavena Bullion, DO;  Location: [facility];  Service: Gastroenterology;  Laterality: N/A;      Family History  Problem Relation Age of Onset   Arthritis Mother    Breast cancer Mother        breast and ovarian, and basal cell   Ovarian cancer Mother    Basal cell carcinoma Mother    Arthritis Father    Basal cell carcinoma Father        basal cell   Heart attack Father    Diabetes Sister    Obesity Sister    Arthritis Sister    Diabetes Paternal Uncle    Diabetes Paternal Grandmother    Diabetes Paternal Grandfather    Stomach cancer Paternal Okey Regal' disease Maternal Grandfather    Heart attack Maternal Grandfather    Colon cancer Neg Hx    Pancreatic cancer Neg Hx      Social History: Social History   Socioeconomic History   Marital status: Widowed    Spouse name: Not on file   Number of children: 4   Years of education: 54   Highest education level: Master's degree (e.g., MA, MS, MEng, MEd, MSW, MBA)  Occupational History   Occupation: retired Pharmacist, hospital   Tobacco Use   Smoking  status: Never   Smokeless tobacco: Never  Vaping Use   Vaping Use: Never used  Substance and Sexual Activity   Alcohol use: Yes    Alcohol/week: 1.0 - 2.0 standard drink    Types: 1 - 2 Standard drinks or equivalent per week   Drug use: No   Sexual activity: Not Currently  Other Topics Concern   Not on file  Social History Narrative   Lives in [place]. Actively involved with social interactions and the community.    Social Determinants of Health   Financial Resource Strain: Low Risk     Difficulty of Paying Living Expenses: Not hard at all  Food Insecurity: No Food Insecurity   Worried About Charity fundraiser in the Last Year: Never true   [place] in the Last Year: Never true  Transportation Needs: No Transportation Needs   Lack of Transportation (Medical): No   Lack of Transportation (Non-Medical): No  Physical Activity: Sufficiently Active   Days of Exercise per Week: 7 days   Minutes of Exercise per Session: 40 min  Stress: No Stress Concern Present   Feeling of Stress : Only a little  Social Connections: Moderately Integrated   Frequency of Communication with Friends and Family: More than three times a week   Frequency of Social Gatherings with Friends and Family: More than three times a week   Attends Religious Services: More than 4 times per year   Active Member of [employer] or Organizations: Yes   Attends Archivist Meetings: More than 4 times per year   Marital Status: Widowed  Human resources officer Violence: Not At Risk   Fear of Current or Ex-Partner: No   Emotionally Abused: No   Physically Abused: No   Sexually Abused: No     Medications Prior to Admission  Medication Sig Dispense Refill Last Dose   AMBULATORY NON FORMULARY MEDICATION Take 1 tablet by mouth 2 (two) times daily. Viviscal otc supplement   12/26/2020   AMBULATORY NON FORMULARY MEDICATION Apply 1 application topically daily. Hempvanna lotion   12/26/2020   Azelaic Acid 15 % cream After skin is thoroughly washed and patted dry, gently but thoroughly massage a thin film of azelaic acid cream into the affected area twice daily, in the morning and evening. (Patient taking differently: Apply 1 application topically daily. After skin is thoroughly washed and patted dry, gently but thoroughly massage a thin film of azelaic acid cream into the affected area) 150 g 3 12/27/2020   Calcium Carbonate-Vit D-Min (CALCIUM 1200) 1200-1000 MG-UNIT CHEW Chew 2 tablets by mouth daily.   12/26/2020    conjugated estrogens (PREMARIN) vaginal cream PLACE 1 APPLICATORFUL VAGINALLY ONCE A WEEK. (Patient taking differently: Place 1 applicator vaginally once a week. Tuesdays) 30 g 11 12/23/2020   fexofenadine (ALLEGRA) 180 MG tablet Take 180 mg by mouth daily.   12/26/2020   hypromellose (SYSTANE OVERNIGHT THERAPY) 0.3 % GEL ophthalmic ointment Place 1 application into both eyes at bedtime.   12/26/2020   ibuprofen (ADVIL) 200 MG tablet Take 200 mg by mouth every 8 (eight) hours as needed for headache or moderate pain.   Past Week   metroNIDAZOLE (METROCREAM) 0.75 % cream Apply 1 application topically daily.   12/26/2020   Misc Natural Products (OSTEO BI-FLEX ADV JOINT SHIELD PO) Take 2 tablets by mouth daily with lunch.    12/26/2020   Multiple Vitamins-Minerals (CENTRUM SILVER ULTRA WOMENS PO) Take 1 tablet by mouth daily at  12 noon.    12/26/2020   omeprazole (PRILOSEC) 40 MG capsule Take 1 capsule (40 mg total) by mouth daily. 90 capsule 3 12/26/2020   Polyethyl Glycol-Propyl Glycol (SYSTANE) 0.4-0.3 % SOLN Place 1 drop into both eyes every morning.   12/26/2020   simvastatin (ZOCOR) 5 MG tablet Take 1 tablet (5 mg total) by mouth daily. 90 tablet 3 12/26/2020   COVID-19 mRNA vaccine, Moderna, 100 MCG/0.5ML injection Inject into the muscle. 0.25 mL 0     Review of Systems  Constitutional: Negative for decreased appetite, malaise/fatigue, weight gain and weight loss.  HENT:  Negative for congestion.   Eyes:  Negative for visual disturbance.  Cardiovascular:  Positive for near-syncope (Now resolved) and palpitations (Now resolved). Negative for chest pain, dyspnea on exertion, leg swelling and syncope.  Respiratory:  Negative for cough.   Endocrine: Negative for cold intolerance.  Hematologic/Lymphatic: Does not bruise/bleed easily.  Skin:  Negative for itching and rash.  Musculoskeletal:  Negative for myalgias.  Gastrointestinal:  Negative for abdominal pain, nausea and vomiting.   Genitourinary:  Negative for dysuria.  Neurological:  Negative for dizziness and weakness.  Psychiatric/Behavioral:  The patient is not nervous/anxious.   All other systems reviewed and are negative.    Physical Exam: Physical Exam Vitals and nursing note reviewed.  Constitutional:      General: She is not in acute distress.    Appearance: She is well-developed.  HENT:     Head: Normocephalic and atraumatic.  Eyes:     Conjunctiva/sclera: Conjunctivae normal.     Pupils: Pupils are equal, round, and reactive to light.  Neck:     Vascular: No JVD.  Cardiovascular:     Rate and Rhythm: Normal rate and regular rhythm.     Pulses: Normal pulses and intact distal pulses.     Heart sounds: No murmur heard. Pulmonary:     Effort: Pulmonary effort is normal.     Breath sounds: Normal breath sounds. No wheezing or rales.  Abdominal:     General: Bowel sounds are normal.     Palpations: Abdomen is soft.     Tenderness: There is no rebound.  Musculoskeletal:        General: No tenderness. Normal range of motion.     Right lower leg: No edema.     Left lower leg: No edema.  Lymphadenopathy:     Cervical: No cervical adenopathy.  Skin:    General: Skin is warm and dry.  Neurological:     Mental Status: She is alert and oriented to person, place, and time.     Cranial Nerves: No cranial nerve deficit.     Labs:   Lab Results  Component Value Date   WBC 13.2 (H) 12/28/2020   HGB 13.2 12/28/2020   HCT 40.3 12/28/2020   MCV 90.6 12/28/2020   PLT 230 12/28/2020    Recent Labs  Lab 12/28/20 0351  NA 138  K 3.7  CL 104  CO2 24  BUN 12  CREATININE 0.62  CALCIUM 8.5*  GLUCOSE 106*    Lipid Panel     Component Value Date/Time   CHOL 182 03/31/2020 1000   TRIG 108 03/31/2020 1000   HDL 67 03/31/2020 1000   CHOLHDL 2.7 03/31/2020 1000   VLDL 22 03/31/2020 1000   LDLCALC 93 03/31/2020 1000     Latest Reference Range & Units 12/27/20 15:40 12/27/20 17:40 12/28/20  03:51 12/28/20 07:16  Troponin I (High Sensitivity) <18 ng/L  38 (H) 100 (HH) 174 (HH) 177 (HH)  (HH): Data is critically high (H): Data is abnormally high  TSH Recent Labs    12/28/20 0351  TSH 1.149      Radiology: DG Chest 2 View  Result Date: 12/27/2020 CLINICAL DATA:  Dizziness, tachycardia, chest pain, arrhythmia and vomiting for 4 hours EXAM: CHEST - 2 VIEW COMPARISON:  None FINDINGS: Normal heart size, mediastinal contours, and pulmonary vascularity. Minimal subsegmental atelectasis at lung bases. Lungs otherwise clear. No pulmonary infiltrate, pleural effusion, or pneumothorax. Osseous structures unremarkable. IMPRESSION: Minimal bibasilar atelectasis. Electronically Signed   By: Lavonia Dana M.D.   On: 12/27/2020 16:38    Scheduled Meds:  metoprolol tartrate  25 mg Oral BID   simvastatin  5 mg Oral Daily   sodium chloride flush  3 mL Intravenous Q12H   Continuous Infusions:  heparin 900 Units/hr (12/28/20 0300)   PRN Meds:.acetaminophen **OR** acetaminophen, sodium chloride flush  CARDIAC STUDIES:  EKG 12/28/2020: Lead loss V2 Poor data quality, interpretation may be adversely affected Normal sinus rhythm New since previous tracing Since last tracing rate slower  EKG 12/27/2020: Afib w/RVR 160 bpm Diffuse ST-T changes, consider ischemia  Echocardiogram: Pending  Assessment & Recommendations:  75 y.o. Caucasian female  with controlled hyperlipidemia, admitted with an episode of Afib w/RVR.  Afib: Paroxysmal, episodes 1-2 times/year.  Currently in sinus rhythm, rate in 60-70s. Episode on 12/24 was very symptomatic. She is otherwise very active and asymptomatic. It would make sense to use rhythm control strategy, perhaps in addition to low dose metoprolol tartrate 25 mg bid. I discussed AAD vs ablation. She prefers AAD. She did have mild trop increase due to demand ischemia. As such, underlying CAD, albeit asymptomatic, cannot be excluded. Therefore, I started  her on Multaq 400 mg bid. Clinically, she has no s/s of heart failure. We will confirm normal EF with echocardiogram.  CHA2DS2VASc score 3, annual stroke risk 3.2%. Recommend eliquis 5 mg bid. Will arrange outpatient exercise nuclear stress test and consider cardiac telemetry. Encourage ambulation and stability while standing/walking. If yes, can be discharged today.     Nigel Mormon, MD Pager: [phone] Office: [phone]

## 2020-12-31 ENCOUNTER — Telehealth: Payer: Self-pay

## 2020-12-31 NOTE — Telephone Encounter (Signed)
Location of hospitalization: [place] Reason for hospitalization: STEMI Date of discharge: 12/28/20 Date of first communication with patient: today Person contacting patient: Baxter Flattery Current symptoms: NA Do you understand why you were in the Hospital: Yes Questions regarding discharge instructions: None Where were you discharged to: Home Medications reviewed: Yes Allergies reviewed: Yes Dietary changes reviewed: Yes. Discussed low fat and low salt diet.  Referals reviewed: NA Activities of Daily Living: Able to with mild limitations Any transportation issues/concerns: None Any patient concerns: None Confirmed importance & date/time of Follow up appt: Yes Confirmed with patient if condition begins to worsen call. Pt was given the office number and encouraged to call back with questions or concerns: Yes

## 2021-01-12 ENCOUNTER — Encounter: Payer: Self-pay | Admitting: Student

## 2021-01-12 ENCOUNTER — Other Ambulatory Visit (HOSPITAL_BASED_OUTPATIENT_CLINIC_OR_DEPARTMENT_OTHER): Payer: Self-pay

## 2021-01-12 ENCOUNTER — Other Ambulatory Visit: Payer: Self-pay

## 2021-01-12 ENCOUNTER — Ambulatory Visit: Payer: Medicare PPO | Admitting: Student

## 2021-01-12 VITALS — BP 118/65 | HR 48 | Temp 97.9°F | Ht 64.0 in | Wt 147.0 lb

## 2021-01-12 DIAGNOSIS — R778 Other specified abnormalities of plasma proteins: Secondary | ICD-10-CM | POA: Diagnosis not present

## 2021-01-12 DIAGNOSIS — E7800 Pure hypercholesterolemia, unspecified: Secondary | ICD-10-CM | POA: Diagnosis not present

## 2021-01-12 DIAGNOSIS — I480 Paroxysmal atrial fibrillation: Secondary | ICD-10-CM

## 2021-01-12 MED ORDER — METOPROLOL TARTRATE 25 MG PO TABS
12.5000 mg | ORAL_TABLET | Freq: Two times a day (BID) | ORAL | 3 refills | Status: DC
  Filled 2021-01-12: qty 90, 90d supply, fill #0

## 2021-01-12 MED ORDER — DRONEDARONE HCL 400 MG PO TABS
400.0000 mg | ORAL_TABLET | Freq: Two times a day (BID) | ORAL | 3 refills | Status: DC
  Filled 2021-01-12 – 2021-03-30 (×2): qty 180, 90d supply, fill #0
  Filled 2021-06-11: qty 60, 30d supply, fill #1
  Filled 2021-07-29: qty 60, 30d supply, fill #2
  Filled 2021-08-23: qty 60, 30d supply, fill #3
  Filled 2021-09-24: qty 60, 30d supply, fill #4
  Filled 2021-10-24: qty 60, 30d supply, fill #5
  Filled 2021-11-23: qty 60, 30d supply, fill #6
  Filled 2021-12-22: qty 120, 60d supply, fill #7

## 2021-01-12 MED ORDER — APIXABAN 5 MG PO TABS
5.0000 mg | ORAL_TABLET | Freq: Two times a day (BID) | ORAL | 3 refills | Status: DC
  Filled 2021-01-12 – 2021-01-27 (×2): qty 180, 90d supply, fill #0
  Filled 2021-04-24: qty 60, 30d supply, fill #1
  Filled 2021-05-25: qty 120, 60d supply, fill #2
  Filled 2021-07-29: qty 120, 60d supply, fill #3
  Filled 2021-09-24: qty 60, 30d supply, fill #4
  Filled 2021-10-24: qty 60, 30d supply, fill #5
  Filled 2021-11-23: qty 60, 30d supply, fill #6
  Filled 2021-12-22: qty 60, 30d supply, fill #7

## 2021-01-12 NOTE — Progress Notes (Signed)
Primary Physician/Referring:  [name], NP  Patient ID: Brittany Harper, female    DOB: 01-17-1945, 76 y.o.   MRN: [id number]  Chief Complaint  Patient presents with   Atrial Fibrillation   Follow-up   Hospitalization Follow-up   HPI:    Brittany Harper  is a 76 y.o. Caucasian female with history of hyperlipidemia well-controlled, GERD, diagnosed with paroxysmal atrial fibrillation 12/28/2020.  Patient presented to [facility] ED 12/27/2020 with palpitations and increasing weakness and fatigue as well as lightheadedness.  EKG at that revealed atrial fibrillation with RVR, therefore she was transported to the emergency department where she spontaneously converted to sinus rhythm.  Troponin was mildly elevated likely secondary to RVR.  She was started on Eliquis, metoprolol, and Multaq and discharged home.  Patient now presents for follow-up.  Echocardiogram during hospitalization was essentially normal with preserved LVEF.  She is tolerating current medications without issue.  Tolerating anticoagulation without bleeding diathesis.  She has had no recurrence of palpitations, lightheadedness, or weakness.  Denies chest pain, dyspnea, syncope, near syncope.  Patient is typically active with regular exercise including walking and line dancing, she is just to return to these activities.  Patient does admit to occasional snoring, denies orthopnea, PND.  Past Medical History:  Diagnosis Date   Allergy    Arthritis    Asthma    Cancer ([place])    basal cell   Gastric ulcer without hemorrhage or perforation    GERD (gastroesophageal reflux disease)    Hiatal hernia with GERD and esophagitis 10/16/2018   Hx of skin cancer, basal cell    Hyperlipidemia    Memory change 04/05/2016   Past Surgical History:  Procedure Laterality Date   ABDOMINAL HYSTERECTOMY     BIOPSY  10/16/2018   Procedure: BIOPSY;  Surgeon: Lavena Bullion, DO;  Location: [facility];  Service: Gastroenterology;;    ESOPHAGOGASTRODUODENOSCOPY (EGD) WITH PROPOFOL N/A 10/16/2018   Procedure: ESOPHAGOGASTRODUODENOSCOPY (EGD) WITH PROPOFOL;  Surgeon: Lavena Bullion, DO;  Location: [facility];  Service: Gastroenterology;  Laterality: N/A;   TRANSORAL INCISIONLESS FUNDOPLICATION N/A 17/51/0258   Procedure: TRANSORAL INCISIONLESS FUNDOPLICATION;  Surgeon: Lavena Bullion, DO;  Location: [facility];  Service: Gastroenterology;  Laterality: N/A;   Family History  Problem Relation Age of Onset   Arthritis Mother    Breast cancer Mother        breast and ovarian, and basal cell   Ovarian cancer Mother    Basal cell carcinoma Mother    Arthritis Father    Basal cell carcinoma Father        basal cell   Heart attack Father    Diabetes Sister    Obesity Sister    Arthritis Sister    Diabetes Paternal Uncle    Diabetes Paternal Grandmother    Diabetes Paternal Grandfather    Stomach cancer Paternal Okey Regal' disease Maternal Grandfather    Heart attack Maternal Grandfather    Colon cancer Neg Hx    Pancreatic cancer Neg Hx     Social History   Tobacco Use   Smoking status: Never   Smokeless tobacco: Never  Substance Use Topics   Alcohol use: Yes    Alcohol/week: 1.0 - 2.0 standard drink    Types: 1 - 2 Standard drinks or equivalent per week   Marital Status: Widowed   ROS  Review of Systems  Constitutional: Negative for malaise/fatigue and weight gain.  Cardiovascular:  Negative for chest pain,  claudication, leg swelling, near-syncope, orthopnea, palpitations, paroxysmal nocturnal dyspnea and syncope.  Respiratory:  Positive for snoring (occasional). Negative for shortness of breath.   Neurological:  Negative for dizziness.   Objective  Blood pressure 118/65, pulse (!) 48, temperature 97.9 F (36.6 C), height 5\' 4"  (1.626 m), weight 147 lb (66.7 kg), SpO2 99 %.  Vitals with BMI 01/12/2021 12/28/2020 12/28/2020  Height 5\' 4"  - -  Weight 147 lbs - -  BMI 68.11 - -  Systolic  572 620 355  Diastolic 65 69 69  Pulse 48 - 79     Physical Exam Vitals reviewed.  Cardiovascular:     Rate and Rhythm: Normal rate and regular rhythm.     Pulses: Intact distal pulses.     Heart sounds: S1 normal and S2 normal. No murmur heard.   No gallop.  Pulmonary:     Effort: Pulmonary effort is normal. No respiratory distress.     Breath sounds: No wheezing, rhonchi or rales.  Musculoskeletal:     Right lower leg: No edema.     Left lower leg: No edema.  Neurological:     Mental Status: She is alert.    Laboratory examination:   Recent Labs    03/31/20 1000 12/27/20 1540 12/28/20 0351  NA 143 140 138  K 4.2 3.8 3.7  CL 104 101 104  CO2 32 28 24  GLUCOSE 95 144* 106*  BUN 14 16 12   CREATININE 0.80 0.95 0.62  CALCIUM 9.6 9.4 8.5*  GFRNONAA >60 >60 >60   estimated creatinine clearance is 55.3 mL/min (by C-G formula based on SCr of 0.62 mg/dL).  CMP Latest Ref Rng & Units 12/28/2020 12/27/2020 03/31/2020  Glucose 70 - 99 mg/dL 106(H) 144(H) 95  BUN 8 - 23 mg/dL 12 16 14   Creatinine 0.44 - 1.00 mg/dL 0.62 0.95 0.80  Sodium 135 - 145 mmol/L 138 140 143  Potassium 3.5 - 5.1 mmol/L 3.7 3.8 4.2  Chloride 98 - 111 mmol/L 104 101 104  CO2 22 - 32 mmol/L 24 28 32  Calcium 8.9 - 10.3 mg/dL 8.5(L) 9.4 9.6  Total Protein 6.5 - 8.1 g/dL - - 6.8  Total Bilirubin 0.3 - 1.2 mg/dL - - 0.4  Alkaline Phos 38 - 126 U/L - - 55  AST 15 - 41 U/L - - 15  ALT 0 - 44 U/L - - 13   CBC Latest Ref Rng & Units 12/28/2020 12/27/2020 03/31/2020  WBC 4.0 - 10.5 K/uL 13.2(H) 11.3(H) 6.6  Hemoglobin 12.0 - 15.0 g/dL 13.2 15.5(H) 14.5  Hematocrit 36.0 - 46.0 % 40.3 47.3(H) 44.4  Platelets 150 - 400 K/uL 230 230 238    Lipid Panel Recent Labs    03/31/20 1000  CHOL 182  TRIG 108  LDLCALC 93  VLDL 22  HDL 67  CHOLHDL 2.7    HEMOGLOBIN A1C No results found for: HGBA1C, MPG TSH Recent Labs    12/28/20 0351  TSH 1.149    External labs:   None  Allergies   Allergies   Allergen Reactions   Ivp Dye [Iodinated Contrast Media] Shortness Of Breath    Feels hot   Augmentin [Amoxicillin-Pot Clavulanate] Nausea And Vomiting    Did it involve swelling of the face/tongue/throat, SOB, or low BP? No Did it involve sudden or severe rash/hives, skin peeling, or any reaction on the inside of your mouth or nose? No Did you need to seek medical attention at a hospital or doctor's office?  No When did it last happen?      20+ years If all above answers are NO, may proceed with cephalosporin use.    Demerol [Meperidine] Nausea And Vomiting   Morphine And Related Nausea And Vomiting   Shellfish Allergy     Medications Prior to Visit:   Outpatient Medications Prior to Visit  Medication Sig Dispense Refill   AMBULATORY NON FORMULARY MEDICATION Take 1 tablet by mouth 2 (two) times daily. Viviscal otc supplement     AMBULATORY NON FORMULARY MEDICATION Apply 1 application topically daily. Hempvanna lotion     Azelaic Acid 15 % cream After skin is thoroughly washed and patted dry, gently but thoroughly massage a thin film of azelaic acid cream into the affected area twice daily, in the morning and evening. (Patient taking differently: Apply 1 application topically daily. After skin is thoroughly washed and patted dry, gently but thoroughly massage a thin film of azelaic acid cream into the affected area) 150 g 3   Calcium Carbonate-Vit D-Min (CALCIUM 1200) 1200-1000 MG-UNIT CHEW Chew 2 tablets by mouth daily.     conjugated estrogens (PREMARIN) vaginal cream PLACE 1 APPLICATORFUL VAGINALLY ONCE A WEEK. (Patient taking differently: Place 1 applicator vaginally once a week. Tuesdays) 30 g 11   fexofenadine (ALLEGRA) 180 MG tablet Take 180 mg by mouth daily.     hypromellose (SYSTANE OVERNIGHT THERAPY) 0.3 % GEL ophthalmic ointment Place 1 application into both eyes at bedtime.     metroNIDAZOLE (METROCREAM) 0.75 % cream Apply 1 application topically daily.     Misc Natural  Products (OSTEO BI-FLEX ADV JOINT SHIELD PO) Take 2 tablets by mouth daily with lunch.      Multiple Vitamins-Minerals (CENTRUM SILVER ULTRA WOMENS PO) Take 1 tablet by mouth daily at 12 noon.      omeprazole (PRILOSEC) 40 MG capsule Take 1 capsule (40 mg total) by mouth daily. 90 capsule 3   Polyethyl Glycol-Propyl Glycol (SYSTANE) 0.4-0.3 % SOLN Place 1 drop into both eyes every morning.     simvastatin (ZOCOR) 5 MG tablet Take 1 tablet (5 mg total) by mouth daily. 90 tablet 3   apixaban (ELIQUIS) 5 MG TABS tablet Take 1 tablet (5 mg total) by mouth 2 (two) times daily. 60 tablet 3   dronedarone (MULTAQ) 400 MG tablet Take 1 tablet (400 mg total) by mouth 2 (two) times daily with a meal. 60 tablet 2   metoprolol tartrate (LOPRESSOR) 25 MG tablet Take 1 tablet (25 mg total) by mouth 2 (two) times daily. 60 tablet 2   No facility-administered medications prior to visit.   Final Medications at End of Visit    No outpatient medications have been marked as taking for the 01/12/21 encounter (Office Visit) with Rayetta Pigg, Deven Audi C, PA-C.   Radiology:   No results found.  Cardiac Studies:   Echocardiogram 12/28/2020: 1. Left ventricular ejection fraction, by estimation, is 65 to 70%. The left ventricle has normal function. The left ventricle has no regional wall motion abnormalities. Left ventricular diastolic parameters were normal.   2. Right ventricular systolic function is normal. The right ventricular size is normal. There is normal pulmonary artery systolic pressure.   3. Mild tricuspid regurgitation. No pulmonary hypertension.   EKG:   01/12/2021: Sinus bradycardia at a rate of 46 bpm.  Normal axis.  Poor R wave progression, cannot exclude anteroseptal infarct old.  Nonspecific T wave abnormality.  12/27/2020: Afib w/RVR 160 bpm Diffuse ST-T changes, consider ischemia  Assessment  ICD-10-CM   1. PAF (paroxysmal atrial fibrillation) (HCC)  I48.0 EKG 12-Lead    2. Elevated  troponin  R77.8     3. Hypercholesteremia  E78.00        Medications Discontinued During This Encounter  Medication Reason   dronedarone (MULTAQ) 400 MG tablet Reorder   metoprolol tartrate (LOPRESSOR) 25 MG tablet    apixaban (ELIQUIS) 5 MG TABS tablet Reorder    Meds ordered this encounter  Medications   metoprolol tartrate (LOPRESSOR) 25 MG tablet    Sig: Take 1/2 tablet (12.5 mg total) by mouth 2 (two) times daily.    Dispense:  90 tablet    Refill:  3   dronedarone (MULTAQ) 400 MG tablet    Sig: Take 1 tablet (400 mg total) by mouth 2 (two) times daily with a meal.    Dispense:  180 tablet    Refill:  3   apixaban (ELIQUIS) 5 MG TABS tablet    Sig: Take 1 tablet (5 mg total) by mouth 2 (two) times daily.    Dispense:  180 tablet    Refill:  3   CHA2DS2VASc score 3, annual stroke risk 3.2%. Recommend eliquis 5 mg bid.  Recommendations:   Brittany Harper is a 76 y.o. Caucasian female with history of hyperlipidemia well-controlled, GERD, diagnosed with paroxysmal atrial fibrillation 12/28/2020.  Patient is tolerating medications including anticoagulation without issue.  However EKG today reveals sinus bradycardia, will therefore reduce Lopressor from 25 mg to 12.5 mg p.o. twice daily.  Blood pressure is well controlled.  Reviewed and discussed pathophysiology and management of atrial fibrillation.  Also discussed results of echocardiogram from recent hospitalization.  Discussed with patient recommendation to consider sleep study given new onset A. fib and history of snoring, however patient wishes to hold off at this time.  Also discussed repeating cardiac monitor, however given the patient has had no recurrence of symptoms shared decision was to hold off at this time.  Given new onset A. Fib and mildly elevated troponin patient is scheduled for nuclear stress test later this month.  We will refill her medications today.  Counseled patient regarding signs and symptoms that would  warrant urgent or emergent evaluation, she verbalized understanding agreement.  Also discussed with patient indication, risk, benefits of anticoagulation as well as precautions, she verbalized understanding.  She continues to agree to be on anticoagulation.  Follow-up in 8 weeks, sooner if needed, for atrial fibrillation.   Alethia Berthold, PA-C 01/12/2021, 9:10 AM Office: [phone]

## 2021-01-13 ENCOUNTER — Other Ambulatory Visit (HOSPITAL_BASED_OUTPATIENT_CLINIC_OR_DEPARTMENT_OTHER): Payer: Self-pay

## 2021-01-13 ENCOUNTER — Ambulatory Visit (HOSPITAL_BASED_OUTPATIENT_CLINIC_OR_DEPARTMENT_OTHER): Payer: Medicare PPO | Admitting: Nurse Practitioner

## 2021-01-13 ENCOUNTER — Encounter (HOSPITAL_BASED_OUTPATIENT_CLINIC_OR_DEPARTMENT_OTHER): Payer: Self-pay | Admitting: Nurse Practitioner

## 2021-01-13 VITALS — BP 121/54 | HR 48 | Wt 142.6 lb

## 2021-01-13 DIAGNOSIS — Z09 Encounter for follow-up examination after completed treatment for conditions other than malignant neoplasm: Secondary | ICD-10-CM | POA: Insufficient documentation

## 2021-01-13 DIAGNOSIS — I480 Paroxysmal atrial fibrillation: Secondary | ICD-10-CM

## 2021-01-13 DIAGNOSIS — R778 Other specified abnormalities of plasma proteins: Secondary | ICD-10-CM

## 2021-01-13 LAB — COMPREHENSIVE METABOLIC PANEL
ALT: 22 IU/L (ref 0–32)
AST: 14 IU/L (ref 0–40)
Albumin/Globulin Ratio: 1.9 (ref 1.2–2.2)
Albumin: 4.2 g/dL (ref 3.7–4.7)
Alkaline Phosphatase: 82 IU/L (ref 44–121)
BUN/Creatinine Ratio: 15 (ref 12–28)
BUN: 13 mg/dL (ref 8–27)
Bilirubin Total: 0.3 mg/dL (ref 0.0–1.2)
CO2: 25 mmol/L (ref 20–29)
Calcium: 9.3 mg/dL (ref 8.7–10.3)
Chloride: 104 mmol/L (ref 96–106)
Creatinine, Ser: 0.89 mg/dL (ref 0.57–1.00)
Globulin, Total: 2.2 g/dL (ref 1.5–4.5)
Glucose: 98 mg/dL (ref 70–99)
Potassium: 4.2 mmol/L (ref 3.5–5.2)
Sodium: 141 mmol/L (ref 134–144)
Total Protein: 6.4 g/dL (ref 6.0–8.5)
eGFR: 67 mL/min/{1.73_m2} (ref >59)

## 2021-01-13 LAB — CBC WITH DIFFERENTIAL/PLATELET
Basophils Absolute: 0 10*3/uL (ref 0.0–0.2)
Basos: 0 %
EOS (ABSOLUTE): 0.2 10*3/uL (ref 0.0–0.4)
Eos: 2 %
Hematocrit: 40.7 % (ref 34.0–46.6)
Hemoglobin: 13.4 g/dL (ref 11.1–15.9)
Immature Grans (Abs): 0 10*3/uL (ref 0.0–0.1)
Immature Granulocytes: 0 %
Lymphocytes Absolute: 2 10*3/uL (ref 0.7–3.1)
Lymphs: 22 %
MCH: 29.8 pg (ref 26.6–33.0)
MCHC: 32.9 g/dL (ref 31.5–35.7)
MCV: 91 fL (ref 79–97)
Monocytes Absolute: 0.9 10*3/uL (ref 0.1–0.9)
Monocytes: 10 %
Neutrophils Absolute: 5.9 10*3/uL (ref 1.4–7.0)
Neutrophils: 66 %
Platelets: 287 10*3/uL (ref 150–450)
RBC: 4.49 x10E6/uL (ref 3.77–5.28)
RDW: 12.1 % (ref 11.7–15.4)
WBC: 9.1 10*3/uL (ref 3.4–10.8)

## 2021-01-13 NOTE — Progress Notes (Signed)
Established Patient Office Visit  Subjective:  Patient ID: Brittany Harper, female    DOB: Oct 22, 1945  Age: 76 y.o. MRN: [id number]  CC:  Chief Complaint  Patient presents with   Hospitalization Follow-up    HPI Brittany Harper presents for hospital follow-up.   HOSPITAL FOLLOW UP Admission Date: 12/27/2020 Time since discharge: 16 days Hospital/facility:  Diagnosis: A. fib with RVR, elevated troponins. Procedures/tests: CXR- normal, ECHO- LVEF 65-70%, EKG Consultants: Cardiology- follow-up 01/12/2021 New medications: Metoprolol tartrate 12.5mg  BID, dronedarone 400mg  BID, apixaban 5mg  BID Discharge instructions:  Start new medications listed above. F/U labs with PCP.  Status: stable  She has been seen by cardiology. She presented to the emergency room on 12/27/2020 with weakness, fatigue, and palpitations.  She was found to have A. fib RVR with elevated and trending up troponin levels.  She was admitted for observation overnight.  Discharge was 12/28/2020.  Echocardiogram during hospitalization showed preserved LVEF.  She was started on metoprolol, dronedarone, and apixaban.  She reports today that she is tolerating these medications well with no side effects.  She is currently taking her medication as prescribed.  She has since followed up with cardiology.  She denies any current concerns with chest pain, chest pressure, shortness of breath, presyncope/syncope, lower extremity edema. She is scheduled for a stress test in two weeks.  She follows a diet of  healthy with low fat and sodium. She exercises with zoom and line dancing classes several times a week.  She denies any new concerns today.  Past Medical History:  Diagnosis Date   Allergy    Arthritis    Asthma    Cancer ([facility])    basal cell   Gastric ulcer without hemorrhage or perforation    GERD (gastroesophageal reflux disease)    Hiatal hernia with GERD and esophagitis 10/16/2018   Hx of skin cancer, basal  cell    Hyperlipidemia    Memory change 04/05/2016    Past Surgical History:  Procedure Laterality Date   ABDOMINAL HYSTERECTOMY     BIOPSY  10/16/2018   Procedure: BIOPSY;  Surgeon: Lavena Bullion, DO;  Location: [facility];  Service: Gastroenterology;;   ESOPHAGOGASTRODUODENOSCOPY (EGD) WITH PROPOFOL N/A 10/16/2018   Procedure: ESOPHAGOGASTRODUODENOSCOPY (EGD) WITH PROPOFOL;  Surgeon: Lavena Bullion, DO;  Location: [facility];  Service: Gastroenterology;  Laterality: N/A;   TRANSORAL INCISIONLESS FUNDOPLICATION N/A 67/34/1937   Procedure: TRANSORAL INCISIONLESS FUNDOPLICATION;  Surgeon: Lavena Bullion, DO;  Location: [facility];  Service: Gastroenterology;  Laterality: N/A;    Family History  Problem Relation Age of Onset   Arthritis Mother    Breast cancer Mother        breast and ovarian, and basal cell   Ovarian cancer Mother    Basal cell carcinoma Mother    Arthritis Father    Basal cell carcinoma Father        basal cell   Heart attack Father    Diabetes Sister    Obesity Sister    Arthritis Sister    Diabetes Paternal Uncle    Diabetes Paternal Grandmother    Diabetes Paternal Grandfather    Stomach cancer Paternal Okey Regal' disease Maternal Grandfather    Heart attack Maternal Grandfather    Colon cancer Neg Hx    Pancreatic cancer Neg Hx     Social History   Socioeconomic History   Marital status: Widowed    Spouse name: Not on file   Number of  children: 4   Years of education: 13   Highest education level: Master's degree (e.g., MA, MS, MEng, MEd, MSW, MBA)  Occupational History   Occupation: retired Pharmacist, hospital   Tobacco Use   Smoking status: Never   Smokeless tobacco: Never  Vaping Use   Vaping Use: Never used  Substance and Sexual Activity   Alcohol use: Yes    Alcohol/week: 1.0 - 2.0 standard drink    Types: 1 - 2 Standard drinks or equivalent per week   Drug use: No   Sexual activity: Not Currently  Other Topics Concern    Not on file  Social History Narrative   Lives in [place]. Actively involved with social interactions and the community.    Social Determinants of Health   Financial Resource Strain: Low Risk    Difficulty of Paying Living Expenses: Not hard at all  Food Insecurity: No Food Insecurity   Worried About Charity fundraiser in the Last Year: Never true   [place] in the Last Year: Never true  Transportation Needs: No Transportation Needs   Lack of Transportation (Medical): No   Lack of Transportation (Non-Medical): No  Physical Activity: Sufficiently Active   Days of Exercise per Week: 7 days   Minutes of Exercise per Session: 40 min  Stress: No Stress Concern Present   Feeling of Stress : Only a little  Social Connections: Moderately Integrated   Frequency of Communication with Friends and Family: More than three times a week   Frequency of Social Gatherings with Friends and Family: More than three times a week   Attends Religious Services: More than 4 times per year   Active Member of [employer] or Organizations: Yes   Attends Archivist Meetings: More than 4 times per year   Marital Status: Widowed  Human resources officer Violence: Not At Risk   Fear of Current or Ex-Partner: No   Emotionally Abused: No   Physically Abused: No   Sexually Abused: No    Outpatient Medications Prior to Visit  Medication Sig Dispense Refill   AMBULATORY NON FORMULARY MEDICATION Take 1 tablet by mouth 2 (two) times daily. Viviscal otc supplement     AMBULATORY NON FORMULARY MEDICATION Apply 1 application topically daily. Hempvanna lotion     apixaban (ELIQUIS) 5 MG TABS tablet Take 1 tablet (5 mg total) by mouth 2 (two) times daily. 180 tablet 3   Azelaic Acid 15 % cream After skin is thoroughly washed and patted dry, gently but thoroughly massage a thin film of azelaic acid cream into the affected area twice daily, in the morning and evening. (Patient taking  differently: Apply 1 application topically daily. After skin is thoroughly washed and patted dry, gently but thoroughly massage a thin film of azelaic acid cream into the affected area) 150 g 3   Calcium Carbonate-Vit D-Min (CALCIUM 1200) 1200-1000 MG-UNIT CHEW Chew 2 tablets by mouth daily.     conjugated estrogens (PREMARIN) vaginal cream PLACE 1 APPLICATORFUL VAGINALLY ONCE A WEEK. (Patient taking differently: Place 1 applicator vaginally once a week. Tuesdays) 30 g 11   dronedarone (MULTAQ) 400 MG tablet Take 1 tablet (400 mg total) by mouth 2 (two) times daily with a meal. 180 tablet 3   fexofenadine (ALLEGRA) 180 MG tablet Take 180 mg by mouth daily.     hypromellose (SYSTANE OVERNIGHT THERAPY) 0.3 % GEL ophthalmic ointment Place 1 application into both eyes at bedtime.     metoprolol tartrate (LOPRESSOR)  25 MG tablet Take 1/2 tablet (12.5 mg total) by mouth 2 (two) times daily. 90 tablet 3   metroNIDAZOLE (METROCREAM) 0.75 % cream Apply 1 application topically daily.     Misc Natural Products (OSTEO BI-FLEX ADV JOINT SHIELD PO) Take 2 tablets by mouth daily with lunch.      Multiple Vitamins-Minerals (CENTRUM SILVER ULTRA WOMENS PO) Take 1 tablet by mouth daily at 12 noon.      omeprazole (PRILOSEC) 40 MG capsule Take 1 capsule (40 mg total) by mouth daily. 90 capsule 3   Polyethyl Glycol-Propyl Glycol (SYSTANE) 0.4-0.3 % SOLN Place 1 drop into both eyes every morning.     simvastatin (ZOCOR) 5 MG tablet Take 1 tablet (5 mg total) by mouth daily. 90 tablet 3   No facility-administered medications prior to visit.    Allergies  Allergen Reactions   Ivp Dye [Iodinated Contrast Media] Shortness Of Breath    Feels hot   Augmentin [Amoxicillin-Pot Clavulanate] Nausea And Vomiting    Did it involve swelling of the face/tongue/throat, SOB, or low BP? No Did it involve sudden or severe rash/hives, skin peeling, or any reaction on the inside of your mouth or nose? No Did you need to seek medical  attention at a hospital or doctor's office? No When did it last happen?      20+ years If all above answers are NO, may proceed with cephalosporin use.    Demerol [Meperidine] Nausea And Vomiting   Morphine And Related Nausea And Vomiting   Shellfish Allergy     ROS Review of Systems All review of systems negative except what is listed in the HPI    Objective:    Physical Exam Vitals and nursing note reviewed.  Constitutional:      General: She is not in acute distress.    Appearance: Normal appearance. She is normal weight. She is not ill-appearing or diaphoretic.  HENT:     Head: Normocephalic.  Eyes:     Extraocular Movements: Extraocular movements intact.     Conjunctiva/sclera: Conjunctivae normal.     Pupils: Pupils are equal, round, and reactive to light.  Neck:     Vascular: No carotid bruit.  Cardiovascular:     Rate and Rhythm: Regular rhythm. Bradycardia present.     Pulses: Normal pulses.     Heart sounds: Normal heart sounds. No murmur heard. Pulmonary:     Effort: Pulmonary effort is normal. No respiratory distress.     Breath sounds: Normal breath sounds. No wheezing.  Abdominal:     General: Abdomen is flat. Bowel sounds are normal. There is no distension.     Palpations: Abdomen is soft.     Tenderness: There is no abdominal tenderness. There is no guarding.  Musculoskeletal:        General: Normal range of motion.     Cervical back: Normal range of motion. No rigidity or tenderness.     Right lower leg: No edema.     Left lower leg: No edema.  Lymphadenopathy:     Cervical: No cervical adenopathy.  Skin:    General: Skin is warm and dry.     Capillary Refill: Capillary refill takes less than 2 seconds.  Neurological:     General: No focal deficit present.     Mental Status: She is alert and oriented to person, place, and time.     Cranial Nerves: No cranial nerve deficit.     Sensory: No sensory deficit.  Motor: No weakness.      Coordination: Coordination normal.     Gait: Gait normal.  Psychiatric:        Mood and Affect: Mood normal.        Behavior: Behavior normal.        Thought Content: Thought content normal.        Judgment: Judgment normal.    BP (!) 121/54    Pulse (!) 48    Wt 142 lb 9.6 oz (64.7 kg)    SpO2 98%    BMI 24.48 kg/m  Wt Readings from Last 3 Encounters:  01/13/21 142 lb 9.6 oz (64.7 kg)  01/12/21 147 lb (66.7 kg)  12/27/20 139 lb 15.9 oz (63.5 kg)     Health Maintenance Due  Topic Date Due   COVID-19 Vaccine (3 - Moderna risk series) 05/15/2020    There are no preventive care reminders to display for this patient.  Lab Results  Component Value Date   TSH 1.149 12/28/2020   Lab Results  Component Value Date   WBC 13.2 (H) 12/28/2020   HGB 13.2 12/28/2020   HCT 40.3 12/28/2020   MCV 90.6 12/28/2020   PLT 230 12/28/2020   Lab Results  Component Value Date   NA 138 12/28/2020   K 3.7 12/28/2020   CO2 24 12/28/2020   GLUCOSE 106 (H) 12/28/2020   BUN 12 12/28/2020   CREATININE 0.62 12/28/2020   BILITOT 0.4 03/31/2020   ALKPHOS 55 03/31/2020   AST 15 03/31/2020   ALT 13 03/31/2020   PROT 6.8 03/31/2020   ALBUMIN 4.5 03/31/2020   CALCIUM 8.5 (L) 12/28/2020   ANIONGAP 10 12/28/2020   GFR 66.03 03/15/2019   Lab Results  Component Value Date   CHOL 182 03/31/2020   Lab Results  Component Value Date   HDL 67 03/31/2020   Lab Results  Component Value Date   LDLCALC 93 03/31/2020   Lab Results  Component Value Date   TRIG 108 03/31/2020   Lab Results  Component Value Date   CHOLHDL 2.7 03/31/2020   No results found for: HGBA1C    Assessment & Plan:   Problem List Items Addressed This Visit     Atrial fibrillation with RVR [facility])    Hospital follow-up for new onset A. fib RVR. Patient is tolerating new medication well without any recurrent incidences of atrial fibrillation that are known. She was seen by cardiology yesterday and metoprolol dosage  was decreased to 12.5 mg twice a day. Her blood pressure is within normal limits today.  Her heart rate is regular but on the slower side in the upper 40s.  She is not exhibiting any symptoms that are concerning at this time. No signs of bleeding. Recommend continuing monitoring for dizziness, shortness of breath, presyncope, weakness, increased fatigue, excessive bruising, any signs of bleeding. We will plan to monitor along with cardiology and follow-up in approximately 6 months or sooner if needed.      Elevated troponin    Found during recent hospitalization with A. fib RVR. Most likely demand ischemia related as no evidence of cardiac concerns were found. No further lab testing needed at this time. She is due for stress test in the coming weeks.  We will follow for results.      Hospital discharge follow-up - Primary    No recurrent A. fib. Doing quite well on medications with no side effects at this time. Planning to return to baseline activity levels once  cleared by cardiology. Stress test in 1 week. Discussed symptoms that would need immediate evaluation with patient and to monitor for any new or changing symptoms or any concerning findings. Will obtain recommended labs today for further evaluation.       Relevant Orders   CBC with Differential/Platelet   Comprehensive metabolic panel    No orders of the defined types were placed in this encounter.   Follow-up: Return for Scheduled for wellness in March .    [name], NP

## 2021-01-13 NOTE — Assessment & Plan Note (Signed)
Found during recent hospitalization with A. fib RVR. Most likely demand ischemia related as no evidence of cardiac concerns were found. No further lab testing needed at this time. She is due for stress test in the coming weeks.  We will follow for results.

## 2021-01-13 NOTE — Assessment & Plan Note (Signed)
Hospital follow-up for new onset A. fib RVR. Patient is tolerating new medication well without any recurrent incidences of atrial fibrillation that are known. She was seen by cardiology yesterday and metoprolol dosage was decreased to 12.5 mg twice a day. Her blood pressure is within normal limits today.  Her heart rate is regular but on the slower side in the upper 40s.  She is not exhibiting any symptoms that are concerning at this time. No signs of bleeding. Recommend continuing monitoring for dizziness, shortness of breath, presyncope, weakness, increased fatigue, excessive bruising, any signs of bleeding. We will plan to monitor along with cardiology and follow-up in approximately 6 months or sooner if needed.

## 2021-01-13 NOTE — Assessment & Plan Note (Signed)
No recurrent A. fib. Doing quite well on medications with no side effects at this time. Planning to return to baseline activity levels once cleared by cardiology. Stress test in 1 week. Discussed symptoms that would need immediate evaluation with patient and to monitor for any new or changing symptoms or any concerning findings. Will obtain recommended labs today for further evaluation.

## 2021-01-13 NOTE — Patient Instructions (Signed)
It was a pleasure to see you today.  I am sorry that it is under these circumstances!  You appear to be doing very well on your current treatment and everything appears to be stable at this time. I would like you to continue to exercise at least 20 minutes daily with some form of activity including walking, dancing, strengthening exercise.  Please do not push yourself too hard and if you begin to notice any palpitations, chest pain, dizziness, weakness, or increased fatigue I want you to let me know immediately. I would like you to continue to follow a healthy diet.  Work to eat less than 13 g of saturated fat per day and work to increase your fiber intake to at least 30 g a day.  Make sure you are staying well-hydrated by drinking plenty of water.  Please let me know immediately if you begin to have signs of bleeding which can include dark/tarry like stools, significantly easy bruising, or throwing up blood.  We will continue to monitor and plan to see how you are doing at your annual wellness visit coming up in April.  Of course if anything changes please do not hesitate to contact the office to be evaluated sooner!

## 2021-01-26 ENCOUNTER — Other Ambulatory Visit: Payer: Self-pay

## 2021-01-26 ENCOUNTER — Ambulatory Visit: Payer: Medicare PPO

## 2021-01-26 DIAGNOSIS — I480 Paroxysmal atrial fibrillation: Secondary | ICD-10-CM

## 2021-01-26 DIAGNOSIS — R778 Other specified abnormalities of plasma proteins: Secondary | ICD-10-CM

## 2021-01-27 ENCOUNTER — Other Ambulatory Visit (HOSPITAL_BASED_OUTPATIENT_CLINIC_OR_DEPARTMENT_OTHER): Payer: Self-pay

## 2021-02-03 ENCOUNTER — Other Ambulatory Visit: Payer: Self-pay

## 2021-02-03 ENCOUNTER — Telehealth: Payer: Self-pay

## 2021-02-09 ENCOUNTER — Telehealth: Payer: Self-pay

## 2021-02-09 NOTE — Telephone Encounter (Signed)
I spoke with the patient and conveyed the results.  Thanks MJP

## 2021-02-09 NOTE — Telephone Encounter (Signed)
Patient never got her stress test resultts

## 2021-03-05 NOTE — Progress Notes (Signed)
Primary Physician/Referring:  [name], NP  Patient ID: Brittany Harper, female    DOB: 1945/05/06, 76 y.o.   MRN: [id number]  Chief Complaint  Patient presents with   Atrial Fibrillation    With EKG   Follow-up    8 weeks   HPI:    Carolin Quang  is a 76 y.o. Caucasian female with history of hyperlipidemia well-controlled, GERD, diagnosed with paroxysmal atrial fibrillation 12/28/2020. Patient was started on Eliquis, metoprolol, and Multaq.   Patient present for 8 week follow up of atrial fibrillation. At last visit patient opted to hold off on sleep study referral as well as repeat cardiac monitor. Patient underwent stress test which was overall low risk.  Patient reports continued fatigue although she is able to do line dancing classes 3 to 5 days/week.  She notices heart rate in the 40s at home and home blood pressure monitoring averaging 125-128/68-75 mmHg.  She also reports mild dyspnea on exertion when walking uphill.  Denies syncope, near syncope, dizziness, chest pain.  She continues to tolerate anticoagulation without bleeding diathesis.  Past Medical History:  Diagnosis Date   Allergy    Arthritis    Asthma    Cancer ([place])    basal cell   Gastric ulcer without hemorrhage or perforation    GERD (gastroesophageal reflux disease)    Hiatal hernia with GERD and esophagitis 10/16/2018   Hx of skin cancer, basal cell    Hyperlipidemia    Memory change 04/05/2016   Past Surgical History:  Procedure Laterality Date   ABDOMINAL HYSTERECTOMY     BIOPSY  10/16/2018   Procedure: BIOPSY;  Surgeon: Lavena Bullion, DO;  Location: [facility];  Service: Gastroenterology;;   ESOPHAGOGASTRODUODENOSCOPY (EGD) WITH PROPOFOL N/A 10/16/2018   Procedure: ESOPHAGOGASTRODUODENOSCOPY (EGD) WITH PROPOFOL;  Surgeon: Lavena Bullion, DO;  Location: [facility];  Service: Gastroenterology;  Laterality: N/A;   TRANSORAL INCISIONLESS FUNDOPLICATION N/A 89/38/1017   Procedure:  TRANSORAL INCISIONLESS FUNDOPLICATION;  Surgeon: Lavena Bullion, DO;  Location: [facility];  Service: Gastroenterology;  Laterality: N/A;   Family History  Problem Relation Age of Onset   Arthritis Mother    Breast cancer Mother        breast and ovarian, and basal cell   Ovarian cancer Mother    Basal cell carcinoma Mother    Arthritis Father    Basal cell carcinoma Father        basal cell   Heart attack Father    Diabetes Sister    Obesity Sister    Arthritis Sister    Diabetes Paternal Uncle    Diabetes Paternal Grandmother    Diabetes Paternal Grandfather    Stomach cancer Paternal Okey Regal' disease Maternal Grandfather    Heart attack Maternal Grandfather    Colon cancer Neg Hx    Pancreatic cancer Neg Hx     Social History   Tobacco Use   Smoking status: Never   Smokeless tobacco: Never  Substance Use Topics   Alcohol use: Yes    Alcohol/week: 2.0 - 3.0 standard drinks    Types: 1 Glasses of wine, 1 - 2 Standard drinks or equivalent per week    Comment: occasionally   Marital Status: Widowed   ROS  Review of Systems  Constitutional: Positive for malaise/fatigue. Negative for weight gain.  Cardiovascular:  Positive for dyspnea on exertion (mild). Negative for chest pain, claudication, leg swelling, near-syncope, orthopnea, palpitations, paroxysmal nocturnal dyspnea and syncope.  Respiratory:  Positive for snoring (occasional). Negative for shortness of breath.   Neurological:  Negative for dizziness.   Objective  Blood pressure (!) 145/79, pulse (!) 56, temperature 97.9 F (36.6 C), temperature source Temporal, resp. rate 16, weight 152 lb 3.2 oz (69 kg), SpO2 98 %.  Vitals with BMI 03/09/2021 01/13/2021 01/12/2021  Height - - 5\' 4"   Weight 152 lbs 3 oz 142 lbs 10 oz 147 lbs  BMI - 44.01 02.72  Systolic 536 644 034  Diastolic 79 54 65  Pulse 56 48 48     Physical Exam Vitals reviewed.  Neck:     Vascular: No carotid bruit.  Cardiovascular:      Rate and Rhythm: Normal rate and regular rhythm.     Pulses: Intact distal pulses.     Heart sounds: S1 normal and S2 normal. No murmur heard.   No gallop.  Pulmonary:     Effort: Pulmonary effort is normal. No respiratory distress.     Breath sounds: No wheezing, rhonchi or rales.  Musculoskeletal:     Right lower leg: No edema.     Left lower leg: No edema.  Neurological:     Mental Status: She is alert.    Laboratory examination:   Recent Labs    03/31/20 1000 12/27/20 1540 12/28/20 0351 01/13/21 0952  NA 143 140 138 141  K 4.2 3.8 3.7 4.2  CL 104 101 104 104  CO2 32 28 24 25   GLUCOSE 95 144* 106* 98  BUN 14 16 12 13   CREATININE 0.80 0.95 0.62 0.89  CALCIUM 9.6 9.4 8.5* 9.3  GFRNONAA >60 >60 >60  --    CrCl cannot be calculated (Patient's most recent lab result is older than the maximum 21 days allowed.).  CMP Latest Ref Rng & Units 01/13/2021 12/28/2020 12/27/2020  Glucose 70 - 99 mg/dL 98 106(H) 144(H)  BUN 8 - 27 mg/dL 13 12 16   Creatinine 0.57 - 1.00 mg/dL 0.89 0.62 0.95  Sodium 134 - 144 mmol/L 141 138 140  Potassium 3.5 - 5.2 mmol/L 4.2 3.7 3.8  Chloride 96 - 106 mmol/L 104 104 101  CO2 20 - 29 mmol/L 25 24 28   Calcium 8.7 - 10.3 mg/dL 9.3 8.5(L) 9.4  Total Protein 6.0 - 8.5 g/dL 6.4 - -  Total Bilirubin 0.0 - 1.2 mg/dL 0.3 - -  Alkaline Phos 44 - 121 IU/L 82 - -  AST 0 - 40 IU/L 14 - -  ALT 0 - 32 IU/L 22 - -   CBC Latest Ref Rng & Units 01/13/2021 12/28/2020 12/27/2020  WBC 3.4 - 10.8 x10E3/uL 9.1 13.2(H) 11.3(H)  Hemoglobin 11.1 - 15.9 g/dL 13.4 13.2 15.5(H)  Hematocrit 34.0 - 46.6 % 40.7 40.3 47.3(H)  Platelets 150 - 450 x10E3/uL 287 230 230    Lipid Panel Recent Labs    03/31/20 1000  CHOL 182  TRIG 108  LDLCALC 93  VLDL 22  HDL 67  CHOLHDL 2.7    HEMOGLOBIN A1C No results found for: HGBA1C, MPG TSH Recent Labs    12/28/20 0351  TSH 1.149    External labs:   None  Allergies   Allergies  Allergen Reactions   Ivp Dye  [Iodinated Contrast Media] Shortness Of Breath    Feels hot   Augmentin [Amoxicillin-Pot Clavulanate] Nausea And Vomiting    Did it involve swelling of the face/tongue/throat, SOB, or low BP? No Did it involve sudden or severe rash/hives, skin peeling, or any  reaction on the inside of your mouth or nose? No Did you need to seek medical attention at a hospital or doctor's office? No When did it last happen?      20+ years If all above answers are NO, may proceed with cephalosporin use.    Demerol [Meperidine] Nausea And Vomiting   Morphine And Related Nausea And Vomiting   Shellfish Allergy     Medications Prior to Visit:   Outpatient Medications Prior to Visit  Medication Sig Dispense Refill   AMBULATORY NON FORMULARY MEDICATION Take 1 tablet by mouth 2 (two) times daily. Viviscal otc supplement     AMBULATORY NON FORMULARY MEDICATION Apply 1 application topically daily. Hempvanna lotion     apixaban (ELIQUIS) 5 MG TABS tablet Take 1 tablet (5 mg total) by mouth 2 (two) times daily. 180 tablet 3   Azelaic Acid 15 % cream After skin is thoroughly washed and patted dry, gently but thoroughly massage a thin film of azelaic acid cream into the affected area twice daily, in the morning and evening. (Patient taking differently: Apply 1 application topically daily. After skin is thoroughly washed and patted dry, gently but thoroughly massage a thin film of azelaic acid cream into the affected area) 150 g 3   Calcium Carbonate-Vit D-Min (CALCIUM 1200) 1200-1000 MG-UNIT CHEW Chew 2 tablets by mouth daily.     conjugated estrogens (PREMARIN) vaginal cream PLACE 1 APPLICATORFUL VAGINALLY ONCE A WEEK. (Patient taking differently: Place 1 applicator vaginally once a week. Tuesdays) 30 g 11   dronedarone (MULTAQ) 400 MG tablet Take 1 tablet (400 mg total) by mouth 2 (two) times daily with a meal. 180 tablet 3   fexofenadine (ALLEGRA) 180 MG tablet Take 180 mg by mouth daily.     hypromellose (SYSTANE  OVERNIGHT THERAPY) 0.3 % GEL ophthalmic ointment Place 1 application into both eyes at bedtime.     metroNIDAZOLE (METROCREAM) 0.75 % cream Apply 1 application topically daily.     Misc Natural Products (OSTEO BI-FLEX ADV JOINT SHIELD PO) Take 2 tablets by mouth daily with lunch.      Multiple Vitamins-Minerals (CENTRUM SILVER ULTRA WOMENS PO) Take 1 tablet by mouth daily at 12 noon.      omeprazole (PRILOSEC) 40 MG capsule Take 1 capsule (40 mg total) by mouth daily. 90 capsule 3   Polyethyl Glycol-Propyl Glycol (SYSTANE) 0.4-0.3 % SOLN Place 1 drop into both eyes every morning.     simvastatin (ZOCOR) 5 MG tablet Take 1 tablet (5 mg total) by mouth daily. 90 tablet 3   metoprolol tartrate (LOPRESSOR) 25 MG tablet Take 1/2 tablet (12.5 mg total) by mouth 2 (two) times daily. 90 tablet 3   No facility-administered medications prior to visit.   Final Medications at End of Visit    Current Meds  Medication Sig   AMBULATORY NON FORMULARY MEDICATION Take 1 tablet by mouth 2 (two) times daily. Viviscal otc supplement   AMBULATORY NON FORMULARY MEDICATION Apply 1 application topically daily. Hempvanna lotion   apixaban (ELIQUIS) 5 MG TABS tablet Take 1 tablet (5 mg total) by mouth 2 (two) times daily.   Azelaic Acid 15 % cream After skin is thoroughly washed and patted dry, gently but thoroughly massage a thin film of azelaic acid cream into the affected area twice daily, in the morning and evening. (Patient taking differently: Apply 1 application topically daily. After skin is thoroughly washed and patted dry, gently but thoroughly massage a thin film of azelaic acid cream  into the affected area)   Calcium Carbonate-Vit D-Min (CALCIUM 1200) 1200-1000 MG-UNIT CHEW Chew 2 tablets by mouth daily.   conjugated estrogens (PREMARIN) vaginal cream PLACE 1 APPLICATORFUL VAGINALLY ONCE A WEEK. (Patient taking differently: Place 1 applicator vaginally once a week. Tuesdays)   dronedarone (MULTAQ) 400 MG tablet  Take 1 tablet (400 mg total) by mouth 2 (two) times daily with a meal.   fexofenadine (ALLEGRA) 180 MG tablet Take 180 mg by mouth daily.   hypromellose (SYSTANE OVERNIGHT THERAPY) 0.3 % GEL ophthalmic ointment Place 1 application into both eyes at bedtime.   metroNIDAZOLE (METROCREAM) 0.75 % cream Apply 1 application topically daily.   Misc Natural Products (OSTEO BI-FLEX ADV JOINT SHIELD PO) Take 2 tablets by mouth daily with lunch.    Multiple Vitamins-Minerals (CENTRUM SILVER ULTRA WOMENS PO) Take 1 tablet by mouth daily at 12 noon.    omeprazole (PRILOSEC) 40 MG capsule Take 1 capsule (40 mg total) by mouth daily.   Polyethyl Glycol-Propyl Glycol (SYSTANE) 0.4-0.3 % SOLN Place 1 drop into both eyes every morning.   simvastatin (ZOCOR) 5 MG tablet Take 1 tablet (5 mg total) by mouth daily.   [DISCONTINUED] metoprolol tartrate (LOPRESSOR) 25 MG tablet Take 1/2 tablet (12.5 mg total) by mouth 2 (two) times daily.   Radiology:   No results found.  Cardiac Studies:  PCV MYOCARDIAL PERFUSION WO LEXISCAN 01/26/2021 Exercise nuclear stress test was performed using Bruce protocol. Patient reached 7 METS, and 91% of age predicted maximum heart rate. Exercise capacity was low normal. No chest pain reported. Dyspnea reported. Heart rate and hemodynamic response were normal. Stress EKG demonstrated sinus tachycardia, <1 mm horizontal ST depression in leads V5,V6 that normalize by 2 min into recovery. These changes are equivocal for ischemia. Normal myocardial perfusion. Stress LVEF 59%. Low risk study.  Echocardiogram 12/28/2020: 1. Left ventricular ejection fraction, by estimation, is 65 to 70%. The left ventricle has normal function. The left ventricle has no regional wall motion abnormalities. Left ventricular diastolic parameters were normal.   2. Right ventricular systolic function is normal. The right ventricular size is normal. There is normal pulmonary artery systolic pressure.   3. Mild  tricuspid regurgitation. No pulmonary hypertension.   EKG:  03/09/2021: Sinus bradycardia rate of 49 bpm.  Normal axis.  Poor R wave progression, cannot exclude anteroseptal infarct old.  Nonspecific T wave abnormality.  Compared EKG 01/23/2021, no significant change.  12/27/2020: Afib w/RVR 160 bpm Diffuse ST-T changes, consider ischemia  Assessment     ICD-10-CM   1. PAF (paroxysmal atrial fibrillation) (HCC)  I48.0 EKG 12-Lead       Medications Discontinued During This Encounter  Medication Reason   metoprolol tartrate (LOPRESSOR) 25 MG tablet Side effect (s)    No orders of the defined types were placed in this encounter.  CHA2DS2VASc score 3, annual stroke risk 3.2%. Recommend eliquis 5 mg bid.  Recommendations:   Lashawnta Burgert is a 76 y.o. Caucasian female with history of hyperlipidemia well-controlled, GERD, diagnosed with paroxysmal atrial fibrillation 12/28/2020. Patient was started on Eliquis, metoprolol, and Multaq.   Patient present for 8 week follow up of atrial fibrillation. At last visit patient opted to hold off on sleep study referral as well as repeat cardiac monitor. Patient underwent stress test which was overall low risk.  Reviewed and discussed results of stress test, details above, no evidence of ischemia.  Patient's blood pressure is minimally elevated in the office today, however it is well controlled on  home monitoring.  Given continued fatigue and bradycardia will stop Lopressor 12.5 mg twice daily.  Patient will continue Multaq.  She is tolerating Eliquis without bleeding diathesis, will continue this.  Follow-up in 6 months, sooner if needed.    Alethia Berthold, PA-C 03/09/2021, 9:56 AM Office: [phone]

## 2021-03-09 ENCOUNTER — Other Ambulatory Visit: Payer: Self-pay

## 2021-03-09 ENCOUNTER — Encounter: Payer: Self-pay | Admitting: Student

## 2021-03-09 ENCOUNTER — Ambulatory Visit: Payer: Medicare PPO | Admitting: Student

## 2021-03-09 VITALS — BP 145/79 | HR 56 | Temp 97.9°F | Resp 16 | Wt 152.2 lb

## 2021-03-09 DIAGNOSIS — I480 Paroxysmal atrial fibrillation: Secondary | ICD-10-CM

## 2021-03-11 DIAGNOSIS — H0100A Unspecified blepharitis right eye, upper and lower eyelids: Secondary | ICD-10-CM | POA: Diagnosis not present

## 2021-03-11 DIAGNOSIS — H04123 Dry eye syndrome of bilateral lacrimal glands: Secondary | ICD-10-CM | POA: Diagnosis not present

## 2021-03-11 DIAGNOSIS — H52203 Unspecified astigmatism, bilateral: Secondary | ICD-10-CM | POA: Diagnosis not present

## 2021-03-11 DIAGNOSIS — H18513 Endothelial corneal dystrophy, bilateral: Secondary | ICD-10-CM | POA: Diagnosis not present

## 2021-03-30 ENCOUNTER — Other Ambulatory Visit (HOSPITAL_BASED_OUTPATIENT_CLINIC_OR_DEPARTMENT_OTHER): Payer: Self-pay

## 2021-04-06 ENCOUNTER — Other Ambulatory Visit (HOSPITAL_BASED_OUTPATIENT_CLINIC_OR_DEPARTMENT_OTHER): Payer: Self-pay

## 2021-04-06 ENCOUNTER — Ambulatory Visit (INDEPENDENT_AMBULATORY_CARE_PROVIDER_SITE_OTHER): Payer: Medicare PPO | Admitting: Nurse Practitioner

## 2021-04-06 ENCOUNTER — Encounter (HOSPITAL_BASED_OUTPATIENT_CLINIC_OR_DEPARTMENT_OTHER): Payer: Self-pay | Admitting: Nurse Practitioner

## 2021-04-06 VITALS — BP 117/72 | HR 65 | Temp 98.7°F | Ht 62.0 in | Wt 151.0 lb

## 2021-04-06 DIAGNOSIS — E782 Mixed hyperlipidemia: Secondary | ICD-10-CM

## 2021-04-06 DIAGNOSIS — I4891 Unspecified atrial fibrillation: Secondary | ICD-10-CM

## 2021-04-06 DIAGNOSIS — Z1231 Encounter for screening mammogram for malignant neoplasm of breast: Secondary | ICD-10-CM | POA: Diagnosis not present

## 2021-04-06 DIAGNOSIS — K219 Gastro-esophageal reflux disease without esophagitis: Secondary | ICD-10-CM

## 2021-04-06 DIAGNOSIS — Z7689 Persons encountering health services in other specified circumstances: Secondary | ICD-10-CM

## 2021-04-06 DIAGNOSIS — N952 Postmenopausal atrophic vaginitis: Secondary | ICD-10-CM

## 2021-04-06 MED ORDER — SIMVASTATIN 10 MG PO TABS
ORAL_TABLET | ORAL | 0 refills | Status: DC
  Filled 2021-04-06: qty 15, 30d supply, fill #0

## 2021-04-06 MED ORDER — SIMVASTATIN 5 MG PO TABS
5.0000 mg | ORAL_TABLET | Freq: Every day | ORAL | 0 refills | Status: DC
  Filled 2021-04-06: qty 330, 330d supply, fill #0
  Filled 2021-05-01: qty 90, 90d supply, fill #0
  Filled 2021-07-04 – 2021-07-20 (×3): qty 90, 90d supply, fill #1
  Filled 2021-10-24: qty 90, 90d supply, fill #2
  Filled 2022-01-22: qty 60, 60d supply, fill #3

## 2021-04-06 MED ORDER — OMEPRAZOLE 40 MG PO CPDR
40.0000 mg | DELAYED_RELEASE_CAPSULE | Freq: Every day | ORAL | 3 refills | Status: DC
  Filled 2021-04-06: qty 90, 90d supply, fill #0
  Filled 2021-07-04: qty 90, 90d supply, fill #1
  Filled 2021-10-10: qty 90, 90d supply, fill #2
  Filled 2022-01-17: qty 90, 90d supply, fill #3

## 2021-04-06 MED ORDER — PREMARIN 0.625 MG/GM VA CREA
1.0000 | TOPICAL_CREAM | VAGINAL | 6 refills | Status: DC
  Filled 2021-04-06: qty 30, 90d supply, fill #0

## 2021-04-06 NOTE — Progress Notes (Signed)
Primary Care & Sports Medicine ?[place] ?at [facility] ?[address], [place]  [address] ?[phone] ? ? ?MEDICARE ANNUAL WELLNESS VISIT ? ?04/06/2021 ? ?Subjective:  ?Brittany Harper is a 76 y.o. female patient of Brittany Harper, Brittany Pesa, NP who had a Medicare Annual Wellness Visit today. Brittany Harper is Retired and lives alone at [employer].  she reports that she is socially active and does interact with friends/family regularly. she is markedly physically active and enjoys walking and dance classes. ? ?Patient Care Team: ?Brittany Harper, Brittany Pesa, NP as PCP - General (Nurse Practitioner) ? ? ?  04/06/2021  ?  9:39 AM 12/27/2020  ?  3:38 PM 04/04/2020  ?  8:38 AM 10/16/2018  ? 11:00 AM 10/16/2018  ?  6:37 AM  ?Advanced Directives  ?Does Patient Have a Medical Advance Directive? Yes No Yes Yes Yes  ?Type of Paramedic of [place];Living will  Living will;Out of facility DNR (pink MOST or yellow form);Healthcare Power of [place];Living will [place];Living will  ?Does patient want to make changes to medical advance directive? No - Patient declined  Yes (MAU/Ambulatory/Procedural Areas - Information given) No - Patient declined   ?Copy of [place] in Chart? No - copy requested  No - copy requested No - copy requested No - copy requested  ?Would patient like information on creating a medical advance directive?  Yes (ED - Information included in AVS)     ? ? ?Hospital Utilization Over the Past 12 Months: ?# of hospitalizations or ER visits: 0 ?# of surgeries: 0 ? ?Review of Systems    ?Patient reports that her overall health is unchanged when compared to last year. ? ?Review of Systems: ?Negative except increased spider veins on her lower extremities.  ? ?All other systems negative. ? ?Pain Assessment ?Pain : No/denies pain ? ?  ? ?Current Medications & Allergies  (verified) ?Allergies as of 04/06/2021   ? ?   Reactions  ? Ivp Dye [iodinated Contrast Media] Shortness Of Breath  ? Feels hot  ? Augmentin [amoxicillin-pot Clavulanate] Nausea And Vomiting  ? Did it involve swelling of the face/tongue/throat, SOB, or low BP? No ?Did it involve sudden or severe rash/hives, skin peeling, or any reaction on the inside of your mouth or nose? No ?Did you need to seek medical attention at a hospital or doctor's office? No ?When did it last happen?      20+ years ?If all above answers are ?NO?, may proceed with cephalosporin use.  ? Demerol [meperidine] Nausea And Vomiting  ? Morphine And Related Nausea And Vomiting  ? Shellfish Allergy   ? ?  ? ?  ?Medication List  ?  ? ?  ? Accurate as of April 06, 2021 10:12 AM. If you have any questions, ask your nurse or doctor.  ?  ?  ? ?  ? ?AMBULATORY NON FORMULARY MEDICATION ?Take 1 tablet by mouth 2 (two) times daily. Viviscal otc supplement ?  ?AMBULATORY NON FORMULARY MEDICATION ?Apply 1 application topically daily. Hempvanna lotion ?  ?Azelaic Acid 15 % gel ?After skin is thoroughly washed and patted dry, gently but thoroughly massage a thin film of azelaic acid cream into the affected area twice daily, in the morning and evening. ?What changed:  ?how much to take ?how to take this ?when to take this ?additional instructions ?  ?Calcium 1200 1200-1000 MG-UNIT Chew ?Chew 2 tablets  by mouth daily. ?  ?CENTRUM SILVER ULTRA WOMENS PO ?Take 1 tablet by mouth daily at 12 noon. ?  ?Eliquis 5 MG Tabs tablet ?Generic drug: apixaban ?Take 1 tablet (5 mg total) by mouth 2 (two) times daily. ?  ?fexofenadine 180 MG tablet ?Commonly known as: ALLEGRA ?Take 180 mg by mouth daily. ?  ?metroNIDAZOLE 0.75 % cream ?Commonly known as: METROCREAM ?Apply 1 application topically daily. ?  ?Multaq 400 MG tablet ?Generic drug: dronedarone ?Take 1 tablet (400 mg total) by mouth 2 (two) times daily with a meal. ?  ?omeprazole 40 MG capsule ?Commonly known as:  PRILOSEC ?Take 1 capsule (40 mg total) by mouth daily. ?  ?OSTEO BI-FLEX ADV JOINT SHIELD PO ?Take 2 tablets by mouth daily with lunch. ?  ?Premarin vaginal cream ?Generic drug: conjugated estrogens ?Place 1 applicatorful vaginally once weekly on Tuesdays. ?(Place 1 Applicatorful vaginally once a week. Tuesdays) ?What changed:  ?how much to take ?how to take this ?when to take this ?additional instructions ?Changed by: Brittany Harper, CMA ?  ?simvastatin 5 MG tablet ?Commonly known as: ZOCOR ?Take 1 tablet (5 mg total) by mouth daily. ?  ?Systane 0.4-0.3 % Soln ?Generic drug: Polyethyl Glycol-Propyl Glycol ?Place 1 drop into both eyes every morning. ?  ?Systane Overnight Therapy 0.3 % Gel ophthalmic ointment ?Generic drug: hypromellose ?Place 1 application into both eyes at bedtime. ?  ? ?  ? ? ?History (reviewed): ?Past Medical History:  ?Diagnosis Date  ? Allergy   ? Arthritis   ? Asthma   ? Cancer [facility])   ? basal cell  ? Gastric ulcer without hemorrhage or perforation   ? GERD (gastroesophageal reflux disease)   ? Hiatal hernia with GERD and esophagitis 10/16/2018  ? Hx of skin cancer, basal cell   ? Hyperlipidemia   ? Memory change 04/05/2016  ? ?Past Surgical History:  ?Procedure Laterality Date  ? ABDOMINAL HYSTERECTOMY    ? BIOPSY  10/16/2018  ? Procedure: BIOPSY;  Surgeon: Lavena Bullion, DO;  Location: [facility];  Service: Gastroenterology;;  ? ESOPHAGOGASTRODUODENOSCOPY (EGD) WITH PROPOFOL N/A 10/16/2018  ? Procedure: ESOPHAGOGASTRODUODENOSCOPY (EGD) WITH PROPOFOL;  Surgeon: Lavena Bullion, DO;  [facility]: [facility];  Service: Gastroenterology;  Laterality: N/A;  ? TRANSORAL INCISIONLESS FUNDOPLICATION N/A 06/27/7626  ? Procedure: TRANSORAL INCISIONLESS FUNDOPLICATION;  Surgeon: Lavena Bullion, DO;  [facility];  Service: Gastroenterology;  Laterality: N/A;  ? ?Family History  ?Problem Relation Age of Onset  ? Arthritis Mother   ? Breast cancer Mother   ?     breast and ovarian,  and basal cell  ? Ovarian cancer Mother   ? Basal cell carcinoma Mother   ? Arthritis Father   ? Basal cell carcinoma Father   ?     basal cell  ? Heart attack Father   ? Diabetes Sister   ? Obesity Sister   ? Arthritis Sister   ? Diabetes Paternal Uncle   ? Diabetes Paternal Grandmother   ? Diabetes Paternal Grandfather   ? Stomach cancer Paternal Aunt   ? Graves' disease Maternal Grandfather   ? Heart attack Maternal Grandfather   ? Colon cancer Neg Hx   ? Pancreatic cancer Neg Hx   ? ?Social History  ? ?Socioeconomic History  ? Marital status: Widowed  ?  Spouse name: Not on file  ? Number of children: 4  ? Years of education: 62  ? Highest education level: Master's degree (e.g., MA, MS, MEng,  MEd, MSW, [employer])  ?Occupational History  ? Occupation: retired Pharmacist, hospital   ?Tobacco Use  ? Smoking status: Never  ? Smokeless tobacco: Never  ?Vaping Use  ? Vaping Use: Never used  ?Substance and Sexual Activity  ? Alcohol use: Yes  ?  Alcohol/week: 2.0 - 3.0 standard drinks  ?  Types: 1 Glasses of wine, 1 - 2 Standard drinks or equivalent per week  ?  Comment: occasionally  ? Drug use: No  ? Sexual activity: Not Currently  ?Other Topics Concern  ? Not on file  ?Social History Narrative  ? Lives in [place]. Actively involved with social interactions and the community.   ? ?Social Determinants of Health  ? ?Financial Resource Strain: Low Risk   ? Difficulty of Paying Living Expenses: Not hard at all  ?Food Insecurity: No Food Insecurity  ? Worried About Charity fundraiser in the Last Year: Never true  ? Ran Out of Food in the Last Year: Never true  ?Transportation Needs: No Transportation Needs  ? Lack of Transportation (Medical): No  ? Lack of Transportation (Non-Medical): No  ?Physical Activity: Sufficiently Active  ? Days of Exercise per Week: 5 days  ? Minutes of Exercise per Session: 60 min  ?Stress: No Stress Concern Present  ? Feeling of Stress : Only a little  ?Social Connections: Moderately  Integrated  ? Frequency of Communication with Friends and Family: More than three times a week  ? Frequency of Social Gatherings with Friends and Family: More than three times a week  ? Attends Religious Services: More than 4 times per y

## 2021-04-06 NOTE — Patient Instructions (Signed)
?  MEDICARE ANNUAL WELLNESS VISIT ?Health Maintenance Summary and Written Plan of Care ? ?Ms. Firebaugh , ? ?Thank you for allowing me to perform your Medicare Annual Wellness Visit and for your ongoing commitment to your health.  ? ?Health Maintenance & Immunization History ?Health Maintenance  ?Topic Date Due  ? COVID-19 Vaccine (3 - Moderna risk series) 10/25/2020  ? INFLUENZA VACCINE  08/04/2021  ? TETANUS/TDAP  12/14/2025  ? Pneumonia Vaccine 95+ Years old  Completed  ? DEXA SCAN  Completed  ? Hepatitis C Screening  Completed  ? Zoster Vaccines- Shingrix  Completed  ? HPV VACCINES  Aged Out  ? COLONOSCOPY (Pts 45-69yr Insurance coverage will need to be confirmed)  Discontinued  ? Fecal DNA (Cologuard)  Discontinued  ? ?Immunization History  ?Administered Date(s) Administered  ? Influenza, High Dose Seasonal PF 10/04/2016, 09/07/2018  ? Influenza-Unspecified 10/16/2014, 09/15/2015, 10/03/2017, 09/21/2019, 09/14/2020  ? Moderna Covid-19 Vaccine Bivalent Booster 115yr& up 10/25/2020  ? Moderna SARS-COV2 Booster Vaccination 04/17/2020  ? Moderna Sars-Covid-2 Vaccination 01/16/2019, 02/14/2019  ? Pneumococcal Conjugate-13 12/12/2014  ? Pneumococcal Polysaccharide-23 10/22/2008  ? Tdap 06/17/2011, 12/15/2015  ? Zoster Recombinat (Shingrix) 01/17/2017, 03/28/2017  ? Zoster, Live 01/05/2004  ? ? ?These are the patient goals that we discussed: ? Goals Addressed   ? ?  ?  ?  ?  ? This Visit's Progress  ?  DIET - INCREASE WATER INTAKE   Not on track  ?  Typically drinking 30 ounces a day and would like to increase to 45 ounces.  ?  ?  DIET - INCREASE WATER INTAKE     ?  Reports she has been working on this over the last year, but not quite to the point that she wants to be. She plans to fill a water bottle in the morning with her day's total goal for water and refill her smaller water bottle that she drinks from and carry's with her throughout the day until she has finished her daily goal. ?  ? ?  ?  ? ?This is a list of  Health Maintenance Items that are overdue or due now: ?I will keep you posted on COVID vaccine recommendations for the future.   ? ?Orders/Referrals Placed Today: ?Orders Placed This Encounter  ?Procedures  ? Ambulatory referral to Cardiology  ?  Referral Priority:   Routine  ?  Referral Type:   Consultation  ?  Referral Reason:   Specialty Services Required  ?  Referred to Provider:   Sk[place] PainMD  ?  Requested Specialty:   Cardiology  ?  Number of Visits Requested:   1  ? ?(Contact our referral department at [phone] you have not spoken with someone about your referral appointment within the next 5 days)  ? ? ?Follow-up Plan ?1 year for annual wellness visit.  ? ? ?SaWorthy KeelerDNP, AGNP-c ? ? ?

## 2021-04-24 ENCOUNTER — Other Ambulatory Visit (HOSPITAL_BASED_OUTPATIENT_CLINIC_OR_DEPARTMENT_OTHER): Payer: Self-pay

## 2021-05-04 ENCOUNTER — Other Ambulatory Visit (HOSPITAL_BASED_OUTPATIENT_CLINIC_OR_DEPARTMENT_OTHER): Payer: Self-pay

## 2021-05-26 ENCOUNTER — Other Ambulatory Visit (HOSPITAL_BASED_OUTPATIENT_CLINIC_OR_DEPARTMENT_OTHER): Payer: Self-pay

## 2021-06-11 ENCOUNTER — Other Ambulatory Visit (HOSPITAL_BASED_OUTPATIENT_CLINIC_OR_DEPARTMENT_OTHER): Payer: Self-pay

## 2021-06-22 NOTE — Telephone Encounter (Signed)
Error

## 2021-06-23 IMAGING — MG MM DIGITAL SCREENING BILAT W/ TOMO AND CAD
8 series · 9 of 24 positions shown · non-contrast
Comparison: Previous exam(s).

CLINICAL DATA: Screening.

EXAM:
DIGITAL SCREENING BILATERAL MAMMOGRAM WITH TOMOSYNTHESIS AND CAD
TECHNIQUE: Bilateral screening digital craniocaudal and mediolateral oblique
mammograms were obtained. Bilateral screening digital breast
tomosynthesis was performed. The images were evaluated with
computer-aided detection.

[L CC synth-2D]
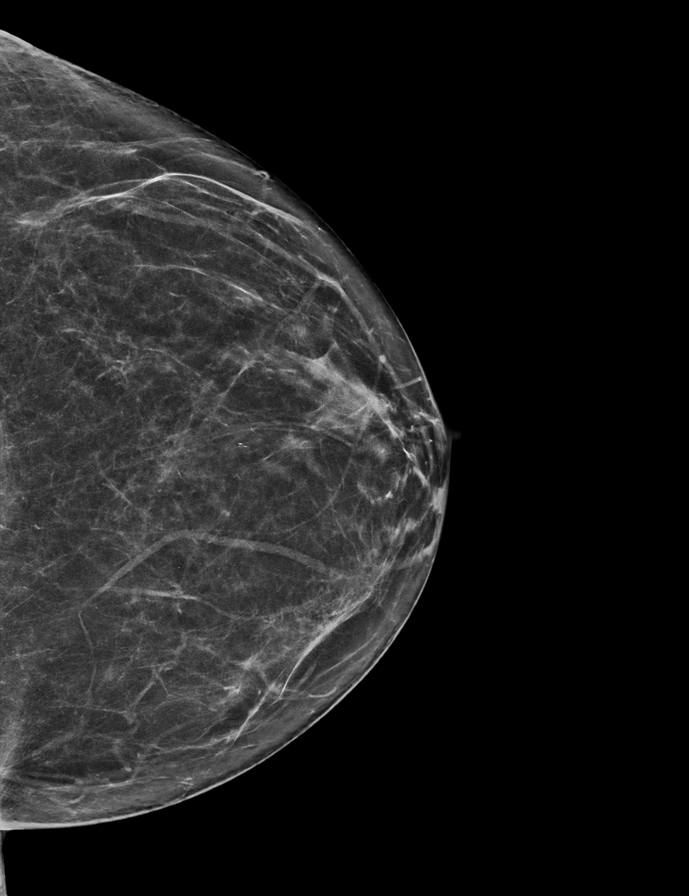

[R MLO synth-2D]
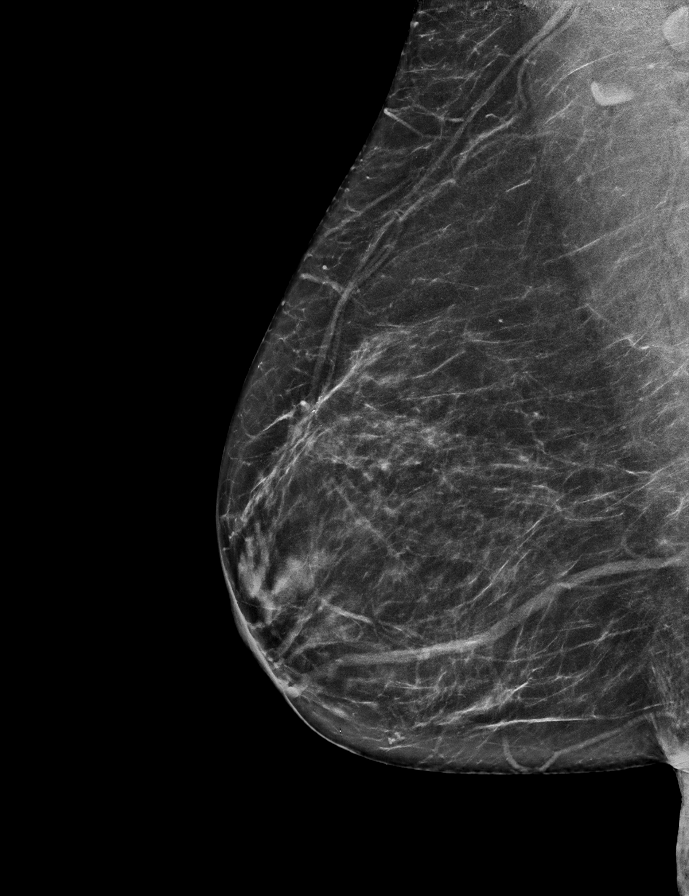

[L MLO synth-2D]
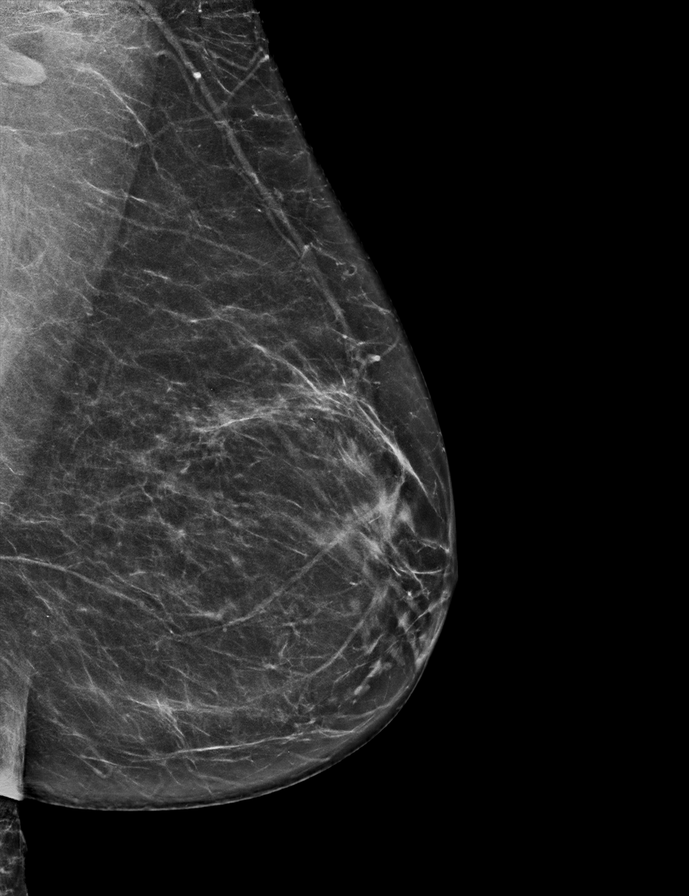

[R CC synth-2D]
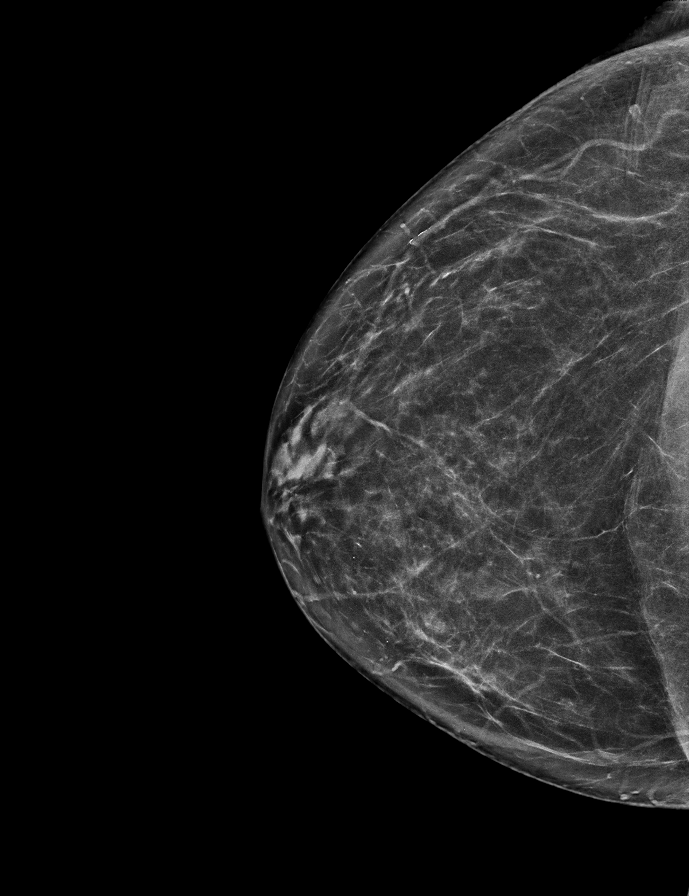

[R MLO tomo · 2 of 65 frames shown]
[frame 21/65]
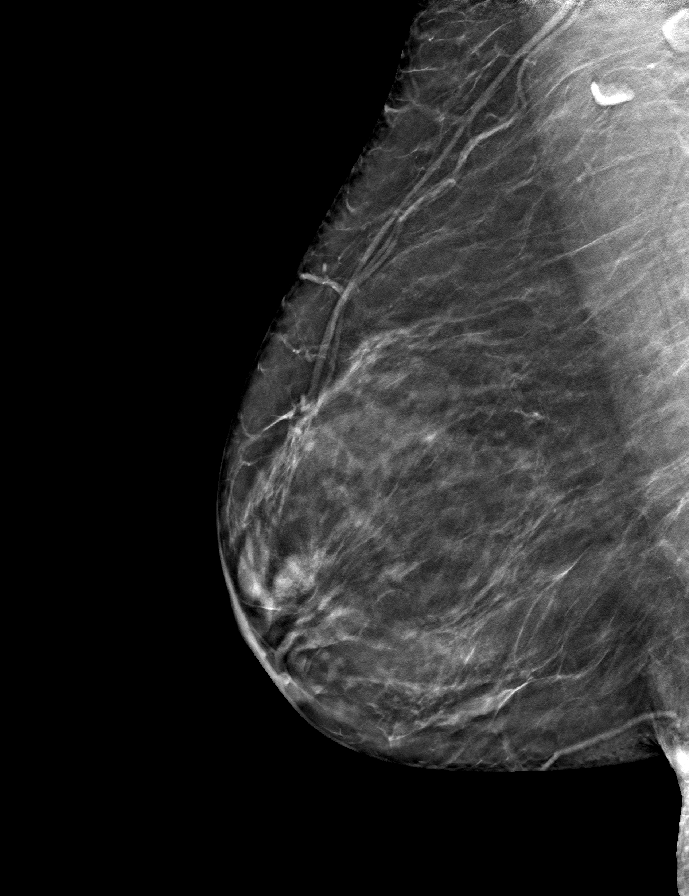
[frame 33/65]
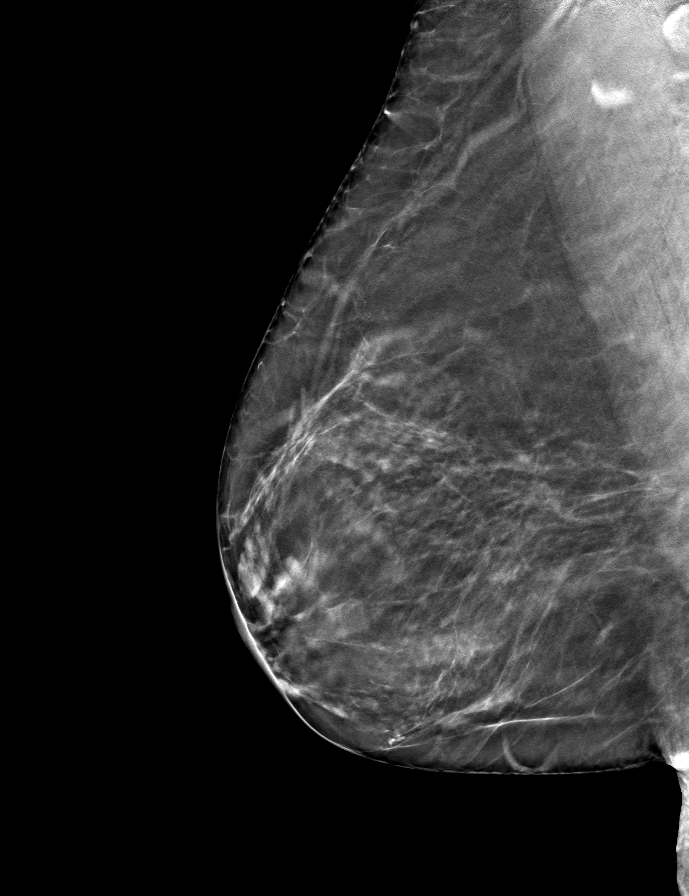

[L MLO tomo · tomo slice 35/68.0]
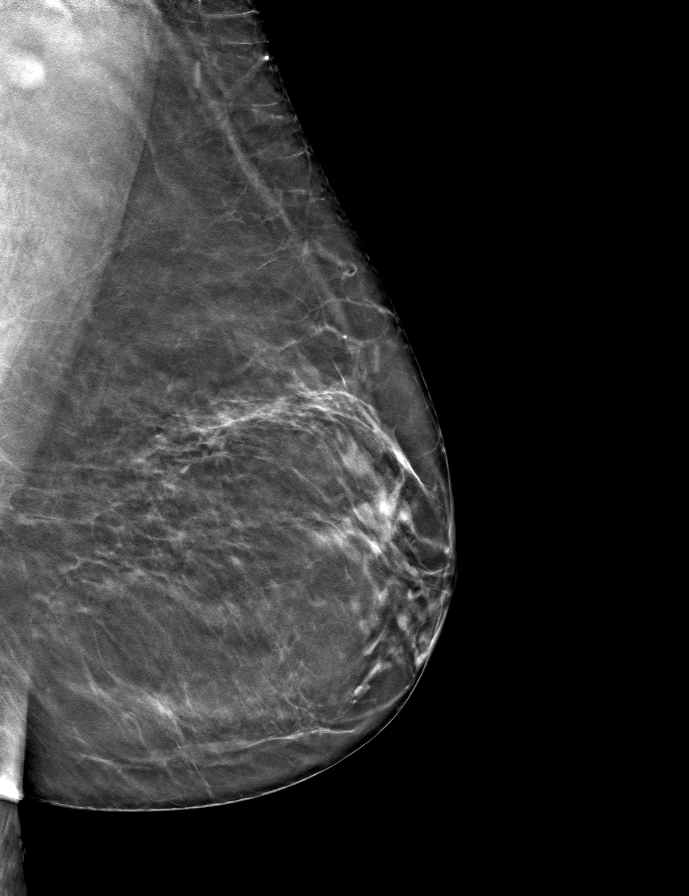

[L CC tomo · tomo slice 32/63.0]
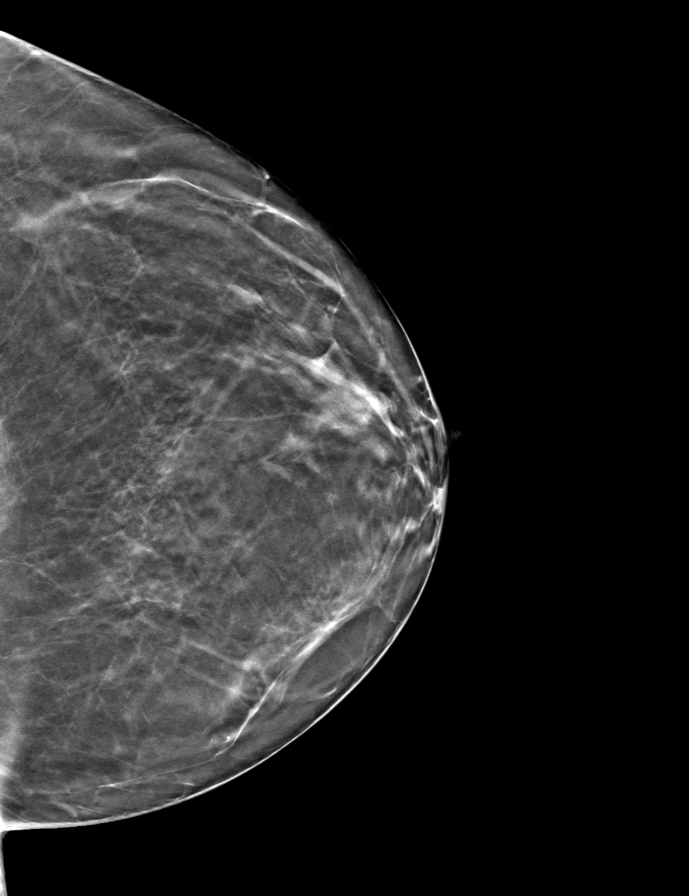

[R CC tomo · tomo slice 33/66.0]
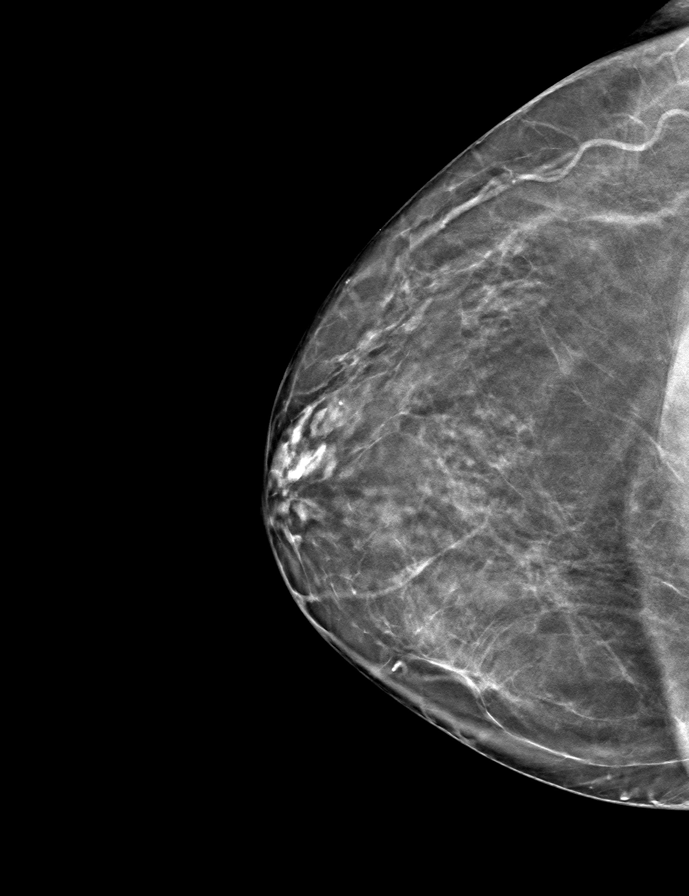

[9 of 24 positions shown; findings below may reference images not displayed]

ACR Breast Density Category b: There are scattered areas of
fibroglandular density.
FINDINGS: There are no findings suspicious for malignancy. The images were
evaluated with computer-aided detection.
IMPRESSION: No mammographic evidence of malignancy. A result letter of this
screening mammogram will be mailed directly to the patient.

RECOMMENDATION:
Screening mammogram in one year. (Code:WJ-I-BG6)

BI-RADS CATEGORY  1: Negative.

## 2021-07-06 ENCOUNTER — Other Ambulatory Visit (HOSPITAL_BASED_OUTPATIENT_CLINIC_OR_DEPARTMENT_OTHER): Payer: Self-pay

## 2021-07-06 ENCOUNTER — Encounter (HOSPITAL_BASED_OUTPATIENT_CLINIC_OR_DEPARTMENT_OTHER): Payer: Self-pay | Admitting: Pharmacist

## 2021-07-10 ENCOUNTER — Ambulatory Visit (HOSPITAL_BASED_OUTPATIENT_CLINIC_OR_DEPARTMENT_OTHER): Payer: Medicare PPO | Admitting: Radiology

## 2021-07-17 ENCOUNTER — Encounter (HOSPITAL_BASED_OUTPATIENT_CLINIC_OR_DEPARTMENT_OTHER): Payer: Self-pay | Admitting: Nurse Practitioner

## 2021-07-17 ENCOUNTER — Ambulatory Visit (HOSPITAL_BASED_OUTPATIENT_CLINIC_OR_DEPARTMENT_OTHER)
Admission: RE | Admit: 2021-07-17 | Discharge: 2021-07-17 | Disposition: A | Discharge status: Home / Self Care | Payer: Medicare PPO | Source: Ambulatory Visit | Attending: Nurse Practitioner | Admitting: Nurse Practitioner

## 2021-07-17 DIAGNOSIS — Z1231 Encounter for screening mammogram for malignant neoplasm of breast: Secondary | ICD-10-CM

## 2021-07-20 ENCOUNTER — Other Ambulatory Visit (HOSPITAL_BASED_OUTPATIENT_CLINIC_OR_DEPARTMENT_OTHER): Payer: Self-pay

## 2021-07-29 ENCOUNTER — Other Ambulatory Visit (HOSPITAL_BASED_OUTPATIENT_CLINIC_OR_DEPARTMENT_OTHER): Payer: Self-pay

## 2021-08-24 ENCOUNTER — Other Ambulatory Visit (HOSPITAL_BASED_OUTPATIENT_CLINIC_OR_DEPARTMENT_OTHER): Payer: Self-pay

## 2021-09-09 ENCOUNTER — Ambulatory Visit: Payer: Medicare PPO | Admitting: Student

## 2021-09-14 ENCOUNTER — Encounter: Payer: Self-pay | Admitting: Cardiology

## 2021-09-14 ENCOUNTER — Ambulatory Visit: Payer: Medicare PPO | Attending: Cardiology | Admitting: Cardiology

## 2021-09-14 VITALS — BP 152/84 | HR 55 | Ht 64.0 in | Wt 151.4 lb

## 2021-09-14 DIAGNOSIS — E7800 Pure hypercholesterolemia, unspecified: Secondary | ICD-10-CM

## 2021-09-14 DIAGNOSIS — I480 Paroxysmal atrial fibrillation: Secondary | ICD-10-CM

## 2021-09-14 NOTE — Progress Notes (Signed)
Cardiology Office Note:    Date:  09/14/2021   ID:  Brittany Harper, DOB March 17, 1945, MRN [id number]  PCP:  [name], NP   [facility] Providers Cardiologist:  Candee Furbish, MD     Referring MD: [name], NP    History of Present Illness:    Brittany Harper is a 76 y.o. female here for evaluation of atrial fibrillation. Previously seen by Dr. Irven Shelling practice.  Had afib 4-5 times before almost like allergy. In 12/22 in hospital, sick, AFIB. MSG allergy.   Has hyperlipidemia well-controlled, GERD, diagnosed with paroxysmal atrial fibrillation 12/28/2020. Patient was started on Eliquis, metoprolol, and Multaq.   She opted to hold off on sleep study referral as well as repeat cardiac monitor. Patient underwent stress test which was overall low risk.  Patient reports continued fatigue although she is able to do line dancing classes 3 to 5 days/week.  She notices heart rate in the 40s at home and home blood pressure monitoring averaging 125-128/68-75 mmHg.   She also reports mild dyspnea on exertion when walking uphill.  Denies syncope, near syncope, dizziness, chest pain.  Very active 1-2 miles. Lives at [employer].  Your Pharmacist, hospital.  She is friends with Fabio Neighbors  We had lengthy discussion about stroke prophylaxis, cost of medications.  She has filled out patient assistance form but does not qualify.  Discussed alternatives such as Coumadin but she is not interested in frequent blood checks.  Has had occasional bruising on lower extremities in relation to Eliquis.  Lengthy discussion today.  Review of medical records.   Past Medical History:  Diagnosis Date   Allergy    Arthritis    Asthma    Cancer ([place])    basal cell   Gastric ulcer without hemorrhage or perforation    GERD (gastroesophageal reflux disease)    Hiatal hernia with GERD and esophagitis 10/16/2018   Hx of skin cancer, basal cell    Hyperlipidemia    Memory change 04/05/2016    Past Surgical History:   Procedure Laterality Date   ABDOMINAL HYSTERECTOMY     BIOPSY  10/16/2018   Procedure: BIOPSY;  Surgeon: Lavena Bullion, DO;  Location: [facility];  Service: Gastroenterology;;   ESOPHAGOGASTRODUODENOSCOPY (EGD) WITH PROPOFOL N/A 10/16/2018   Procedure: ESOPHAGOGASTRODUODENOSCOPY (EGD) WITH PROPOFOL;  Surgeon: Lavena Bullion, DO;  Location: [facility];  Service: Gastroenterology;  Laterality: N/A;   TRANSORAL INCISIONLESS FUNDOPLICATION N/A 84/13/2440   Procedure: TRANSORAL INCISIONLESS FUNDOPLICATION;  Surgeon: Lavena Bullion, DO;  Location: [facility];  Service: Gastroenterology;  Laterality: N/A;    Current Medications: Current Meds  Medication Sig   apixaban (ELIQUIS) 5 MG TABS tablet Take 1 tablet (5 mg total) by mouth 2 (two) times daily.   Azelaic Acid 15 % cream After skin is thoroughly washed and patted dry, gently but thoroughly massage a thin film of azelaic acid cream into the affected area twice daily, in the morning and evening. (Patient taking differently: Apply 1 application  topically daily. After skin is thoroughly washed and patted dry, gently but thoroughly massage a thin film of azelaic acid cream into the affected area)   Calcium Carbonate-Vit D-Min (CALCIUM 1200) 1200-1000 MG-UNIT CHEW Chew 2 tablets by mouth daily.   dronedarone (MULTAQ) 400 MG tablet Take 1 tablet (400 mg total) by mouth 2 (two) times daily with a meal.   fexofenadine (ALLEGRA) 180 MG tablet Take 180 mg by mouth daily.   hypromellose (SYSTANE OVERNIGHT THERAPY) 0.3 %  GEL ophthalmic ointment Place 1 application into both eyes at bedtime.   metroNIDAZOLE (METROCREAM) 0.75 % cream Apply 1 application topically daily.   Misc Natural Products (OSTEO BI-FLEX ADV JOINT SHIELD PO) Take 2 tablets by mouth daily with lunch.    Multiple Vitamins-Minerals (CENTRUM SILVER ULTRA WOMENS PO) Take 1 tablet by mouth daily at 12 noon.    omeprazole (PRILOSEC) 40 MG capsule Take 1 capsule (40 mg total)  by mouth daily.   Polyethyl Glycol-Propyl Glycol (SYSTANE) 0.4-0.3 % SOLN Place 1 drop into both eyes every morning.   simvastatin (ZOCOR) 5 MG tablet Take 1 tablet (5 mg total) by mouth daily.     Allergies:   Ivp dye [iodinated contrast media], Augmentin [amoxicillin-pot clavulanate], Demerol [meperidine], Morphine and related, and Shellfish allergy   Social History   Socioeconomic History   Marital status: Widowed    Spouse name: Not on file   Number of children: 4   Years of education: 78   Highest education level: Master's degree (e.g., MA, MS, MEng, MEd, MSW, MBA)  Occupational History   Occupation: retired Pharmacist, hospital   Tobacco Use   Smoking status: Never   Smokeless tobacco: Never  Vaping Use   Vaping Use: Never used  Substance and Sexual Activity   Alcohol use: Yes    Alcohol/week: 2.0 - 3.0 standard drinks of alcohol    Types: 1 Glasses of wine, 1 - 2 Standard drinks or equivalent per week    Comment: occasionally   Drug use: No   Sexual activity: Not Currently  Other Topics Concern   Not on file  Social History Narrative   Lives in [place]. Actively involved with social interactions and the community.    Social Determinants of Health   Financial Resource Strain: Low Risk  (04/06/2021)   Overall Financial Resource Strain (CARDIA)    Difficulty of Paying Living Expenses: Not hard at all  Food Insecurity: No Food Insecurity (04/06/2021)   Hunger Vital Sign    Worried About Running Out of Food in the Last Year: Never true    Ran Out of Food in the Last Year: Never true  Transportation Needs: No Transportation Needs (04/06/2021)   PRAPARE - Hydrologist (Medical): No    Lack of Transportation (Non-Medical): No  Physical Activity: Sufficiently Active (04/06/2021)   Exercise Vital Sign    Days of Exercise per Week: 5 days    Minutes of Exercise per Session: 60 min  Stress: No Stress Concern Present (04/06/2021)   [place]    Feeling of Stress : Only a little  Social Connections: Moderately Integrated (04/06/2021)   Social Connection and Isolation Panel [NHANES]    Frequency of Communication with Friends and Family: More than three times a week    Frequency of Social Gatherings with Friends and Family: More than three times a week    Attends Religious Services: More than 4 times per year    Active Member of [employer] or Organizations: Yes    Attends Archivist Meetings: More than 4 times per year    Marital Status: Widowed     Family History: [name] family history includes Arthritis in her father, mother, and sister; Basal cell carcinoma in her father and mother; Breast cancer in her mother; Diabetes in her paternal grandfather, paternal grandmother, paternal uncle, and sister; Berenice Primas' disease in her maternal grandfather; Heart attack in her father  and maternal grandfather; Obesity in her sister; Ovarian cancer in her mother; Stomach cancer in her paternal aunt. There is no history of Colon cancer or Pancreatic cancer.  ROS:   Please see the history of present illness.     All other systems reviewed and are negative.  EKGs/Labs/Other Studies Reviewed:    The following studies were reviewed today: PCV MYOCARDIAL PERFUSION WO LEXISCAN 01/26/2021 Exercise nuclear stress test was performed using Bruce protocol. Patient reached 7 METS, and 91% of age predicted maximum heart rate. Exercise capacity was low normal. No chest pain reported. Dyspnea reported. Heart rate and hemodynamic response were normal. Stress EKG demonstrated sinus tachycardia, <1 mm horizontal ST depression in leads V5,V6 that normalize by 2 min into recovery. These changes are equivocal for ischemia. Normal myocardial perfusion. Stress LVEF 59%. Low risk study.   Echocardiogram 12/28/2020: 1. Left ventricular ejection fraction, by estimation, is 65 to 70%. The left  ventricle has normal function. The left ventricle has no regional wall motion abnormalities. Left ventricular diastolic parameters were normal.   2. Right ventricular systolic function is normal. The right ventricular size is normal. There is normal pulmonary artery systolic pressure.   3. Mild tricuspid regurgitation. No pulmonary hypertension.   EKG:  EKG is  ordered today.  The ekg ordered today demonstrates sinus bradycardia 55 PR interval 136 poor R wave progression  03/09/2021: Sinus bradycardia rate of 49 bpm.  Normal axis.  Poor R wave progression, cannot exclude anteroseptal infarct old.  Nonspecific T wave abnormality.   Recent Labs: 12/28/2020: TSH 1.149 01/13/2021: ALT 22; BUN 13; Creatinine, Ser 0.89; Hemoglobin 13.4; Platelets 287; Potassium 4.2; Sodium 141  Recent Lipid Panel    Component Value Date/Time   CHOL 182 03/31/2020 1000   TRIG 108 03/31/2020 1000   HDL 67 03/31/2020 1000   CHOLHDL 2.7 03/31/2020 1000   VLDL 22 03/31/2020 1000   LDLCALC 93 03/31/2020 1000     Risk Assessment/Calculations:              Physical Exam:    VS:  BP (!) 152/84 (BP Location: Left Arm, Patient Position: Sitting, Cuff Size: Normal)   Pulse (!) 55   Ht '5\' 4"'$  (1.626 m)   Wt 151 lb 6 oz (68.7 kg)   BMI 25.98 kg/m     Wt Readings from Last 3 Encounters:  09/14/21 151 lb 6 oz (68.7 kg)  04/06/21 151 lb (68.5 kg)  03/09/21 152 lb 3.2 oz (69 kg)     GEN:  Well nourished, well developed in no acute distress HEENT: Normal NECK: No JVD; No carotid bruits LYMPHATICS: No lymphadenopathy CARDIAC: Bradycardic regular, no murmurs, no rubs, gallops RESPIRATORY:  Clear to auscultation without rales, wheezing or rhonchi  ABDOMEN: Soft, non-tender, non-distended MUSCULOSKELETAL:  No edema; No deformity  SKIN: Warm and dry NEUROLOGIC:  Alert and oriented x 3 PSYCHIATRIC:  Normal affect   ASSESSMENT:    1. PAF (paroxysmal atrial fibrillation) ([place])   2. Hypercholesteremia     PLAN:    In order of problems listed above:  Paroxysmal atrial fibrillation - Continuing with Multaq, metoprolol, Eliquis for anticoagulation.  We lengthy discussion today about these medications especially Eliquis.  Optimally she would like to be off of this medicine.  She would desire not to take any medications she states.  I did relate to her the risk of stroke.  I relayed to her that aspirin alone does not reduce that risk.  She did voice that  if she had a stroke, she would hope that it would be 1 that would take her life.  I explained that this is not usually the case and that people can be left debilitated following stroke.  Ultimately she is very active very healthy and also frustrated by cost of both Eliquis and Multaq together. - I will refer her to Dr. Quentin Ore to discuss potential Watchman device out of fear of noncompliance and increased risk of stroke.  May also consider change of antiarrhythmic potentially to flecainide for instance for cost.  Chronic anticoagulation - Eliquis.  No issues with bleeding.  Continue to monitor both hemoglobin as well as creatinine periodically.    Medication Adjustments/Labs and Tests Ordered: Current medicines are reviewed at length with the patient today.  Concerns regarding medicines are outlined above.  Orders Placed This Encounter  Procedures   Ambulatory referral to Cardiac Electrophysiology   EKG 12-Lead   No orders of the defined types were placed in this encounter.   Patient Instructions  Medication Instructions:  The current medical regimen is effective;  continue present plan and medications.  *If you need a refill on your cardiac medications before your next appointment, please call your pharmacy*  You have been referred to Dr Lars Mage to discuss Watchman Device.  Follow-Up: At [facility], you and your health needs are our priority.  As part of our continuing mission to provide you with exceptional heart  care, we have created designated Provider Care Teams.  These Care Teams include your primary Cardiologist (physician) and Advanced Practice Providers (APPs -  Physician Assistants and Nurse Practitioners) who all work together to provide you with the care you need, when you need it.  We recommend signing up for the patient portal called "MyChart".  Sign up information is provided on this After Visit Summary.  MyChart is used to connect with patients for Virtual Visits (Telemedicine).  Patients are able to view lab/test results, encounter notes, upcoming appointments, etc.  Non-urgent messages can be sent to your provider as well.   To learn more about what you can do with MyChart, go to [email or web address].    Your next appointment:   6 month(s)  The format for your next appointment:   In Person  Provider:   Candee Furbish, MD      Important Information About Sugar         Signed, Candee Furbish, MD  09/14/2021 9:54 AM    [place]

## 2021-09-14 NOTE — Patient Instructions (Signed)
Medication Instructions:  The current medical regimen is effective;  continue present plan and medications.  *If you need a refill on your cardiac medications before your next appointment, please call your pharmacy*  You have been referred to Dr Lars Mage to discuss Watchman Device.  Follow-Up: At [facility], you and your health needs are our priority.  As part of our continuing mission to provide you with exceptional heart care, we have created designated Provider Care Teams.  These Care Teams include your primary Cardiologist (physician) and Advanced Practice Providers (APPs -  Physician Assistants and Nurse Practitioners) who all work together to provide you with the care you need, when you need it.  We recommend signing up for the patient portal called "MyChart".  Sign up information is provided on this After Visit Summary.  MyChart is used to connect with patients for Virtual Visits (Telemedicine).  Patients are able to view lab/test results, encounter notes, upcoming appointments, etc.  Non-urgent messages can be sent to your provider as well.   To learn more about what you can do with MyChart, go to [email or web address].    Your next appointment:   6 month(s)  The format for your next appointment:   In Person  Provider:   Candee Furbish, MD      Important Information About Sugar

## 2021-09-24 ENCOUNTER — Other Ambulatory Visit (HOSPITAL_BASED_OUTPATIENT_CLINIC_OR_DEPARTMENT_OTHER): Payer: Self-pay

## 2021-09-30 ENCOUNTER — Other Ambulatory Visit (HOSPITAL_BASED_OUTPATIENT_CLINIC_OR_DEPARTMENT_OTHER): Payer: Self-pay

## 2021-09-30 DIAGNOSIS — L570 Actinic keratosis: Secondary | ICD-10-CM | POA: Diagnosis not present

## 2021-09-30 DIAGNOSIS — D485 Neoplasm of uncertain behavior of skin: Secondary | ICD-10-CM | POA: Diagnosis not present

## 2021-09-30 DIAGNOSIS — L718 Other rosacea: Secondary | ICD-10-CM | POA: Diagnosis not present

## 2021-09-30 DIAGNOSIS — L821 Other seborrheic keratosis: Secondary | ICD-10-CM | POA: Diagnosis not present

## 2021-09-30 DIAGNOSIS — L814 Other melanin hyperpigmentation: Secondary | ICD-10-CM | POA: Diagnosis not present

## 2021-09-30 DIAGNOSIS — L218 Other seborrheic dermatitis: Secondary | ICD-10-CM | POA: Diagnosis not present

## 2021-09-30 MED ORDER — METRONIDAZOLE 0.75 % EX CREA
TOPICAL_CREAM | Freq: Two times a day (BID) | CUTANEOUS | 11 refills | Status: DC
  Filled 2021-09-30: qty 45, 30d supply, fill #0
  Filled 2021-12-22: qty 45, 30d supply, fill #1
  Filled 2022-03-16: qty 45, 30d supply, fill #2
  Filled 2022-06-02: qty 45, 30d supply, fill #3
  Filled 2022-09-05: qty 45, 30d supply, fill #4

## 2021-09-30 MED ORDER — FLUOROURACIL 5 % EX CREA
TOPICAL_CREAM | CUTANEOUS | 1 refills | Status: DC
  Filled 2021-09-30: qty 40, 14d supply, fill #0

## 2021-10-05 ENCOUNTER — Other Ambulatory Visit (HOSPITAL_BASED_OUTPATIENT_CLINIC_OR_DEPARTMENT_OTHER): Payer: Self-pay

## 2021-10-06 ENCOUNTER — Other Ambulatory Visit (HOSPITAL_BASED_OUTPATIENT_CLINIC_OR_DEPARTMENT_OTHER): Payer: Self-pay

## 2021-10-06 MED ORDER — FLUAD QUADRIVALENT 0.5 ML IM PRSY
PREFILLED_SYRINGE | INTRAMUSCULAR | 0 refills | Status: DC
  Filled 2021-10-06: qty 0.5, 1d supply, fill #0

## 2021-10-12 ENCOUNTER — Other Ambulatory Visit (HOSPITAL_BASED_OUTPATIENT_CLINIC_OR_DEPARTMENT_OTHER): Payer: Self-pay

## 2021-10-26 ENCOUNTER — Other Ambulatory Visit (HOSPITAL_BASED_OUTPATIENT_CLINIC_OR_DEPARTMENT_OTHER): Payer: Self-pay

## 2021-11-03 ENCOUNTER — Ambulatory Visit: Payer: Medicare PPO | Attending: Cardiology | Admitting: Cardiology

## 2021-11-03 ENCOUNTER — Encounter: Payer: Self-pay | Admitting: Cardiology

## 2021-11-03 VITALS — BP 114/76 | HR 55 | Ht 64.0 in | Wt 152.8 lb

## 2021-11-03 DIAGNOSIS — I480 Paroxysmal atrial fibrillation: Secondary | ICD-10-CM | POA: Diagnosis not present

## 2021-11-03 DIAGNOSIS — Z01818 Encounter for other preprocedural examination: Secondary | ICD-10-CM | POA: Diagnosis not present

## 2021-11-03 DIAGNOSIS — Z8711 Personal history of peptic ulcer disease: Secondary | ICD-10-CM | POA: Diagnosis not present

## 2021-11-03 NOTE — Progress Notes (Signed)
Electrophysiology Office Note:    Date:  11/03/2021   ID:  Brittany Harper, DOB [birth year], MRN [id number]  PCP:  [name], NP  [facility] Cardiologist:  Candee Furbish, MD  [facility] Electrophysiologist:  Vickie Epley, MD   Referring MD: Jerline Pain, MD   Chief Complaint: Atrial fibrillation  History of Present Illness:    Brittany Harper is a 76 y.o. female who presents for an evaluation of atrial fibrillation at the request of Dr. Marlou Porch. Their medical history includes GERD, asthma, gastric ulcer.  The patient was diagnosed with A-fib in December 2022.  She takes Eliquis, metoprolol and dronedarone.  She is fairly active in line dances 3-5 times per week.  She is interested in avoiding long-term anticoagulation given excessive costs and a history of gastric ulcer.  Today, she reports that she only has episodes of atrial fibrillation when she's had certain foods (shell fish and MSG). Her last episode was on 12/27/2020. She voices concerns about whether or not she has to continue medications due to her episodes being so infrequent. She has been compliantly taking her medications (eliquis, metoprolol).   She denies any palpitations, chest pain, shortness of breath, or peripheral edema. No lightheadedness, headaches, syncope, orthopnea, or PND.    Past Medical History:  Diagnosis Date   Allergy    Arthritis    Asthma    Cancer ([place])    basal cell   Gastric ulcer without hemorrhage or perforation    GERD (gastroesophageal reflux disease)    Hiatal hernia with GERD and esophagitis 10/16/2018   Hx of skin cancer, basal cell    Hyperlipidemia    Memory change 04/05/2016    Past Surgical History:  Procedure Laterality Date   ABDOMINAL HYSTERECTOMY     BIOPSY  10/16/2018   Procedure: BIOPSY;  Surgeon: Lavena Bullion, DO;  Location: [facility];  Service: Gastroenterology;;   ESOPHAGOGASTRODUODENOSCOPY (EGD) WITH PROPOFOL N/A 10/16/2018   Procedure:  ESOPHAGOGASTRODUODENOSCOPY (EGD) WITH PROPOFOL;  Surgeon: Lavena Bullion, DO;  Location: [facility];  Service: Gastroenterology;  Laterality: N/A;   TRANSORAL INCISIONLESS FUNDOPLICATION N/A 95/09/3265   Procedure: TRANSORAL INCISIONLESS FUNDOPLICATION;  Surgeon: Lavena Bullion, DO;  Location: [facility];  Service: Gastroenterology;  Laterality: N/A;    Current Medications: Current Meds  Medication Sig   AMBULATORY NON FORMULARY MEDICATION Apply 1 application topically daily. Hempvanna lotion   apixaban (ELIQUIS) 5 MG TABS tablet Take 1 tablet (5 mg total) by mouth 2 (two) times daily.   Calcium Carbonate-Vit D-Min (CALCIUM 1200) 1200-1000 MG-UNIT CHEW Chew 2 tablets by mouth daily.   dronedarone (MULTAQ) 400 MG tablet Take 1 tablet (400 mg total) by mouth 2 (two) times daily with a meal.   fexofenadine (ALLEGRA) 180 MG tablet Take 180 mg by mouth daily.   hypromellose (SYSTANE OVERNIGHT THERAPY) 0.3 % GEL ophthalmic ointment Place 1 application into both eyes at bedtime.   metroNIDAZOLE (METROCREAM) 0.75 % cream Apply 1 application on the skin twice a day   Misc Natural Products (OSTEO BI-FLEX ADV JOINT SHIELD PO) Take 2 tablets by mouth daily with lunch.    Multiple Vitamins-Minerals (CENTRUM SILVER ULTRA WOMENS PO) Take 1 tablet by mouth daily at 12 noon.    omeprazole (PRILOSEC) 40 MG capsule Take 1 capsule (40 mg total) by mouth daily.   Polyethyl Glycol-Propyl Glycol (SYSTANE) 0.4-0.3 % SOLN Place 1 drop into both eyes every morning.   simvastatin (ZOCOR) 5 MG tablet Take 1 tablet (5  mg total) by mouth daily.     Allergies:   Ivp dye [iodinated contrast media], Augmentin [amoxicillin-pot clavulanate], Demerol [meperidine], Morphine and related, and Shellfish allergy   Social History   Socioeconomic History   Marital status: Widowed    Spouse name: Not on file   Number of children: 4   Years of education: 33   Highest education level: Master's degree (e.g., MA, MS,  MEng, MEd, MSW, MBA)  Occupational History   Occupation: retired Pharmacist, hospital   Tobacco Use   Smoking status: Never   Smokeless tobacco: Never  Vaping Use   Vaping Use: Never used  Substance and Sexual Activity   Alcohol use: Yes    Alcohol/week: 2.0 - 3.0 standard drinks of alcohol    Types: 1 Glasses of wine, 1 - 2 Standard drinks or equivalent per week    Comment: occasionally   Drug use: No   Sexual activity: Not Currently  Other Topics Concern   Not on file  Social History Narrative   Lives in [place]. Actively involved with social interactions and the community.    Social Determinants of Health   Financial Resource Strain: Low Risk  (04/06/2021)   Overall Financial Resource Strain (CARDIA)    Difficulty of Paying Living Expenses: Not hard at all  Food Insecurity: No Food Insecurity (04/06/2021)   Hunger Vital Sign    Worried About Running Out of Food in the Last Year: Never true    Ran Out of Food in the Last Year: Never true  Transportation Needs: No Transportation Needs (04/06/2021)   PRAPARE - Hydrologist (Medical): No    Lack of Transportation (Non-Medical): No  Physical Activity: Sufficiently Active (04/06/2021)   Exercise Vital Sign    Days of Exercise per Week: 5 days    Minutes of Exercise per Session: 60 min  Stress: No Stress Concern Present (04/06/2021)   [place]    Feeling of Stress : Only a little  Social Connections: Moderately Integrated (04/06/2021)   Social Connection and Isolation Panel [NHANES]    Frequency of Communication with Friends and Family: More than three times a week    Frequency of Social Gatherings with Friends and Family: More than three times a week    Attends Religious Services: More than 4 times per year    Active Member of [employer] or Organizations: Yes    Attends Archivist Meetings: More than 4 times per year     Marital Status: Widowed     Family History: [name] family history includes Arthritis in her father, mother, and sister; Basal cell carcinoma in her father and mother; Breast cancer in her mother; Diabetes in her paternal grandfather, paternal grandmother, paternal uncle, and sister; Berenice Primas' disease in her maternal grandfather; Heart attack in her father and maternal grandfather; Obesity in her sister; Ovarian cancer in her mother; Stomach cancer in her paternal aunt. There is no history of Colon cancer or Pancreatic cancer.  ROS:   Please see the history of present illness.   (+)  All other systems reviewed and are negative.  EKGs/Labs/Other Studies Reviewed:    The following studies were reviewed today:  December 28, 2020 echo EF 65 RV normal Mild TR  January 27, 2021 stress test Patient reached 7 METS Low risk study  EKG:   11/03/2021: The ekg today demonstrates sinus rhythm 09/14/2021: The ekg ordered today demonstrates sinus bradycardia,  ventricular rate 55 bpm  Recent Labs: 12/28/2020: TSH 1.149 01/13/2021: ALT 22; BUN 13; Creatinine, Ser 0.89; Hemoglobin 13.4; Platelets 287; Potassium 4.2; Sodium 141  Recent Lipid Panel    Component Value Date/Time   CHOL 182 03/31/2020 1000   TRIG 108 03/31/2020 1000   HDL 67 03/31/2020 1000   CHOLHDL 2.7 03/31/2020 1000   VLDL 22 03/31/2020 1000   LDLCALC 93 03/31/2020 1000    Physical Exam:    VS:  BP 114/76   Pulse (!) 55   Ht '5\' 4"'$  (1.626 m)   Wt 152 lb 12.8 oz (69.3 kg)   SpO2 99%   BMI 26.23 kg/m     Wt Readings from Last 3 Encounters:  11/03/21 152 lb 12.8 oz (69.3 kg)  09/14/21 151 lb 6 oz (68.7 kg)  04/06/21 151 lb (68.5 kg)     GEN: Well nourished, well developed in no acute distress HEENT: Normal NECK: No JVD; No carotid bruits LYMPHATICS: No lymphadenopathy CARDIAC: RRR, no murmurs, rubs, gallops RESPIRATORY:  Clear to auscultation without rales, wheezing or rhonchi  ABDOMEN: Soft, non-tender,  non-distended MUSCULOSKELETAL:  No edema; No deformity  SKIN: Warm and dry NEUROLOGIC:  Alert and oriented x 3 PSYCHIATRIC:  Normal affect       ASSESSMENT:    1. PAF (paroxysmal atrial fibrillation) ([place])   2. History of gastric ulcer    PLAN:    In order of problems listed above:  #Paroxysmal atrial fibrillation Discussed treatment options for her AF including AAD and PVI. She would like to avoid long term use of AAD and has decided to proceed with PVI.   Discussed treatment options today for their AF including antiarrhythmic drug therapy and ablation. Discussed risks, recovery and likelihood of success. Discussed potential need for repeat ablation procedures and antiarrhythmic drugs after an initial ablation. They wish to proceed with scheduling.  Risk, benefits, and alternatives to EP study and radiofrequency ablation for afib were also discussed in detail today. These risks include but are not limited to stroke, bleeding, vascular damage, tamponade, perforation, damage to the esophagus, lungs, and other structures, pulmonary vein stenosis, worsening renal function, and death. The patient understands these risk and wishes to proceed.  We will therefore proceed with catheter ablation at the next available time.  Carto, ICE, anesthesia are requested for the procedure.  Will also obtain CT PV protocol prior to the procedure to exclude LAA thrombus and further evaluate atrial anatomy.  -----------  I have seen Brittany Harper in the office today who is being considered for a Watchman left atrial appendage closure device. I believe they will benefit from this procedure given their history of atrial fibrillation, CHA2DS2-VASc score of 3 and unadjusted ischemic stroke rate of 3.2% per year. [name] chart has been reviewed and I feel that they would be a candidate for short term oral anticoagulation after Watchman implant.   It is my belief that after undergoing a LAA closure procedure,  Brittany Harper will not need long term anticoagulation which eliminates anticoagulation side effects and major bleeding risk.   Procedural risks for the Watchman implant have been reviewed with the patient including a 0.5% risk of stroke, <1% risk of perforation and <1% risk of device embolization. Other risks include bleeding, vascular damage, tamponade, worsening renal function, and death. The patient understands these risk and wishes to proceed.     The published clinical data on the safety and effectiveness of WATCHMAN include but are not limited to  the following: - Vertell Limber DR, Mechele Claude, Sick P et al. for the PROTECT AF Investigators. Percutaneous closure of the left atrial appendage versus warfarin therapy for prevention of stroke in patients with atrial fibrillation: a randomised non-inferiority trial. Lancet 2009; 374: 534-42. Mechele Claude, Doshi SK, Abelardo Diesel D et al. on behalf of the PROTECT AF Investigators. Percutaneous Left Atrial Appendage Closure for Stroke Prophylaxis in Patients With Atrial Fibrillation 2.3-Year Follow-up of the PROTECT AF (Watchman Left Atrial Appendage System for Embolic Protection in Patients With Atrial Fibrillation) Trial. Circulation 2013; 127:720-729. - Alli O, Doshi S,  Kar S, Reddy VY, Sievert H et al. Quality of Life Assessment in the Randomized PROTECT AF (Percutaneous Closure of the Left Atrial Appendage Versus Warfarin Therapy for Prevention of Stroke in Patients With Atrial Fibrillation) Trial of Patients at Risk for Stroke With Nonvalvular Atrial Fibrillation. J Am Coll Cardiol 2013; 39:0300-9. Vertell Limber DR, Tarri Abernethy, Price M, [place], Sievert H, Doshi S, Huber K, Reddy V. Prospective randomized evaluation of the Watchman left atrial appendage Device in patients with atrial fibrillation versus long-term warfarin therapy; the PREVAIL trial. Journal of the [employer] of Cardiology, Vol. 4, No. 1, 2014, 1-11. - Kar S, Doshi SK, Sadhu A, Horton R, Osorio  J et al. Primary outcome evaluation of a next-generation left atrial appendage closure device: results from the PINNACLE FLX trial. Circulation 2021;143(18)1754-1762.    After today's visit with the patient which was dedicated solely for shared decision making visit regarding LAA closure device, the patient decided to proceed with the LAA appendage closure procedure scheduled to be done in the near future at [facility]. Prior to the procedure, I would like to obtain a gated CT scan of the chest with contrast timed for PV/LA visualization.    HAS-BLED score 1 Hypertension No  Abnormal renal and liver function (Dialysis, transplant, Cr >2.26 mg/dL /Cirrhosis or Bilirubin >2x Normal or AST/ALT/AP >3x Normal) No  Stroke No  Bleeding No  Labile INR (Unstable/high INR) No  Elderly (>65) Yes  Drugs or alcohol (? 8 drinks/week, anti-plt or NSAID) No   CHA2DS2-VASc Score = 3  [name] score is based upon: CHF History: 0 HTN History: 0 Diabetes History: 0 Stroke History: 0 Vascular Disease History: 0 Age Score: 2 Gender Score: 1    Medication Adjustments/Labs and Tests Ordered: Current medicines are reviewed at length with the patient today.  Concerns regarding medicines are outlined above.  No orders of the defined types were placed in this encounter.  No orders of the defined types were placed in this encounter.   I,Rachel Rivera,acting as a scribe for Vickie Epley, MD.,have documented all relevant documentation on the behalf of Vickie Epley, MD,as directed by  Vickie Epley, MD while in the presence of Vickie Epley, MD.  I, Vickie Epley, MD, have reviewed all documentation for this visit. The documentation on 11/03/21 for the exam, diagnosis, procedures, and orders are all accurate and complete.   Signed, Hilton Cork. Quentin Ore, MD, [facility], [facility] 11/03/2021 2:39 PM    Electrophysiology [facility]

## 2021-11-03 NOTE — Patient Instructions (Addendum)
Medication Instructions:  No changes *If you need a refill on your cardiac medications before your next appointment, please call your pharmacy*   Lab Work: None  If you have labs (blood work) drawn today and your tests are completely normal, you will receive your results only by: [place] (if you have MyChart) OR A paper copy in the mail If you have any lab test that is abnormal or we need to change your treatment, we will call you to review the results.   Testing/Procedures: Your physician has requested that you have an echocardiogram. Echocardiography is a painless test that uses sound waves to create images of your heart. It provides your doctor with information about the size and shape of your heart and how well your heart's chambers and valves are working. This procedure takes approximately one hour. There are no restrictions for this procedure. Please do NOT wear cologne, perfume, aftershave, or lotions (deodorant is allowed). Please arrive 15 minutes prior to your appointment time.  Your physician has requested that you have cardiac CT. Cardiac computed tomography (CT) is a painless test that uses an x-ray machine to take clear, detailed pictures of your heart. For further information please visit [email or web address]. Please follow instruction sheet as given.  Your physician has recommended that you have an ablation. Catheter ablation is a medical procedure used to treat some cardiac arrhythmias (irregular heartbeats). During catheter ablation, a long, thin, flexible tube is put into a blood vessel in your groin (upper thigh), or neck. This tube is called an ablation catheter. It is then guided to your heart through the blood vessel. Radio frequency waves destroy small areas of heart tissue where abnormal heartbeats may cause an arrhythmia to start. Please see the instruction sheet given to you today.  Your physician has requested that you have Left atrial appendage (LAA) closure  device implantation is a procedure to put a small device in the LAA of the heart. The LAA is a small sac in the wall of the heart's left upper chamber. Blood clots can form in this area. The device, Watchman closes the LAA to help prevent a blood clot and stroke.    Follow-Up: At [facility], you and your health needs are our priority.  As part of our continuing mission to provide you with exceptional heart care, we have created designated Provider Care Teams.  These Care Teams include your primary Cardiologist (physician) and Advanced Practice Providers (APPs -  Physician Assistants and Nurse Practitioners) who all work together to provide you with the care you need, when you need it.  We recommend signing up for the patient portal called "MyChart".  Sign up information is provided on this After Visit Summary.  MyChart is used to connect with patients for Virtual Visits (Telemedicine).  Patients are able to view lab/test results, encounter notes, upcoming appointments, etc.  Non-urgent messages can be sent to your provider as well.   To learn more about what you can do with MyChart, go to [email or web address].    Your next appointment:   Ablation date picked is Feb 2, pre op lab date Jan 8. Your will get your CT and ablation instructions over mychart.    Important Information About Sugar

## 2021-11-03 NOTE — Progress Notes (Deleted)
Electrophysiology Office Note:    Date:  11/03/2021   ID:  Brittany Harper, DOB [birth year], MRN [id number]  PCP:  [name], NP  [facility] Cardiologist:  Candee Furbish, MD  [facility] Electrophysiologist:  Vickie Epley, MD   Referring MD: Jerline Pain, MD   Chief Complaint: Atrial fibrillation  History of Present Illness:    Brittany Harper is a 76 y.o. female who presents for an evaluation of atrial fibrillation at the request of Dr. Marlou Porch. Their medical history includes GERD, asthma, gastric ulcer.  The patient was diagnosed with A-fib in December 2022.  She takes Eliquis, metoprolol and dronedarone.  She is fairly active in line dances 3-5 times per week.  She is interested in avoiding long-term anticoagulation given excessive costs and a history of gastric ulcer.     Past Medical History:  Diagnosis Date   Allergy    Arthritis    Asthma    Cancer ([place])    basal cell   Gastric ulcer without hemorrhage or perforation    GERD (gastroesophageal reflux disease)    Hiatal hernia with GERD and esophagitis 10/16/2018   Hx of skin cancer, basal cell    Hyperlipidemia    Memory change 04/05/2016    Past Surgical History:  Procedure Laterality Date   ABDOMINAL HYSTERECTOMY     BIOPSY  10/16/2018   Procedure: BIOPSY;  Surgeon: Lavena Bullion, DO;  Location: [facility];  Service: Gastroenterology;;   ESOPHAGOGASTRODUODENOSCOPY (EGD) WITH PROPOFOL N/A 10/16/2018   Procedure: ESOPHAGOGASTRODUODENOSCOPY (EGD) WITH PROPOFOL;  Surgeon: Lavena Bullion, DO;  Location: [facility];  Service: Gastroenterology;  Laterality: N/A;   TRANSORAL INCISIONLESS FUNDOPLICATION N/A 43/15/4008   Procedure: TRANSORAL INCISIONLESS FUNDOPLICATION;  Surgeon: Lavena Bullion, DO;  Location: [facility];  Service: Gastroenterology;  Laterality: N/A;    Current Medications: No outpatient medications have been marked as taking for the 11/03/21 encounter (Office Visit) with  Vickie Epley, MD.     Allergies:   Ivp dye [iodinated contrast media], Augmentin [amoxicillin-pot clavulanate], Demerol [meperidine], Morphine and related, and Shellfish allergy   Social History   Socioeconomic History   Marital status: Widowed    Spouse name: Not on file   Number of children: 4   Years of education: 74   Highest education level: Master's degree (e.g., MA, MS, MEng, MEd, MSW, MBA)  Occupational History   Occupation: retired Pharmacist, hospital   Tobacco Use   Smoking status: Never   Smokeless tobacco: Never  Vaping Use   Vaping Use: Never used  Substance and Sexual Activity   Alcohol use: Yes    Alcohol/week: 2.0 - 3.0 standard drinks of alcohol    Types: 1 Glasses of wine, 1 - 2 Standard drinks or equivalent per week    Comment: occasionally   Drug use: No   Sexual activity: Not Currently  Other Topics Concern   Not on file  Social History Narrative   Lives in [place]. Actively involved with social interactions and the community.    Social Determinants of Health   Financial Resource Strain: Low Risk  (04/06/2021)   Overall Financial Resource Strain (CARDIA)    Difficulty of Paying Living Expenses: Not hard at all  Food Insecurity: No Food Insecurity (04/06/2021)   Hunger Vital Sign    Worried About Running Out of Food in the Last Year: Never true    Ran Out of Food in the Last Year: Never true  Transportation Needs: No Transportation  Needs (04/06/2021)   PRAPARE - Hydrologist (Medical): No    Lack of Transportation (Non-Medical): No  Physical Activity: Sufficiently Active (04/06/2021)   Exercise Vital Sign    Days of Exercise per Week: 5 days    Minutes of Exercise per Session: 60 min  Stress: No Stress Concern Present (04/06/2021)   [place]    Feeling of Stress : Only a little  Social Connections: Moderately Integrated (04/06/2021)   Social  Connection and Isolation Panel [NHANES]    Frequency of Communication with Friends and Family: More than three times a week    Frequency of Social Gatherings with Friends and Family: More than three times a week    Attends Religious Services: More than 4 times per year    Active Member of [employer] or Organizations: Yes    Attends Archivist Meetings: More than 4 times per year    Marital Status: Widowed     Family History: [name] family history includes Arthritis in her father, mother, and sister; Basal cell carcinoma in her father and mother; Breast cancer in her mother; Diabetes in her paternal grandfather, paternal grandmother, paternal uncle, and sister; Berenice Primas' disease in her maternal grandfather; Heart attack in her father and maternal grandfather; Obesity in her sister; Ovarian cancer in her mother; Stomach cancer in her paternal aunt. There is no history of Colon cancer or Pancreatic cancer.  ROS:   Please see the history of present illness.    All other systems reviewed and are negative.  EKGs/Labs/Other Studies Reviewed:    The following studies were reviewed today:  December 28, 2020 echo EF 65 RV normal Mild TR  January 27, 2021 stress test Patient reached 7 METS Low risk study  EKG:  The ekg ordered today demonstrates sinus bradycardia, ventricular rate 55 bpm   Recent Labs: 12/28/2020: TSH 1.149 01/13/2021: ALT 22; BUN 13; Creatinine, Ser 0.89; Hemoglobin 13.4; Platelets 287; Potassium 4.2; Sodium 141  Recent Lipid Panel    Component Value Date/Time   CHOL 182 03/31/2020 1000   TRIG 108 03/31/2020 1000   HDL 67 03/31/2020 1000   CHOLHDL 2.7 03/31/2020 1000   VLDL 22 03/31/2020 1000   LDLCALC 93 03/31/2020 1000    Physical Exam:    VS:  There were no vitals taken for this visit.    Wt Readings from Last 3 Encounters:  09/14/21 151 lb 6 oz (68.7 kg)  04/06/21 151 lb (68.5 kg)  03/09/21 152 lb 3.2 oz (69 kg)     GEN: *** Well nourished,  well developed in no acute distress HEENT: Normal NECK: No JVD; No carotid bruits LYMPHATICS: No lymphadenopathy CARDIAC: ***RRR, no murmurs, rubs, gallops RESPIRATORY:  Clear to auscultation without rales, wheezing or rhonchi  ABDOMEN: Soft, non-tender, non-distended MUSCULOSKELETAL:  No edema; No deformity  SKIN: Warm and dry NEUROLOGIC:  Alert and oriented x 3 PSYCHIATRIC:  Normal affect       ASSESSMENT:    1. PAF (paroxysmal atrial fibrillation) ([place])   2. History of gastric ulcer    PLAN:    In order of problems listed above:  #Paroxysmal atrial fibrillation  Alternative antiarrhythmic versus ablation for rhythm control Watchman for stroke risk mitigation***  -----------  I have seen Erasmo Leventhal in the office today who is being considered for a Watchman left atrial appendage closure device. I believe they will benefit from this procedure given their history  of atrial fibrillation, CHA2DS2-VASc score of 3 and unadjusted ischemic stroke rate of 3.2% per year. [name] chart has been reviewed and I feel that they would be a candidate for short term oral anticoagulation after Watchman implant.   It is my belief that after undergoing a LAA closure procedure, Naira Standiford will not need long term anticoagulation which eliminates anticoagulation side effects and major bleeding risk.   Procedural risks for the Watchman implant have been reviewed with the patient including a 0.5% risk of stroke, <1% risk of perforation and <1% risk of device embolization. Other risks include bleeding, vascular damage, tamponade, worsening renal function, and death. The patient understands these risk and wishes to proceed.     The published clinical data on the safety and effectiveness of WATCHMAN include but are not limited to the following: - Holmes DR, Mechele Claude, Sick P et al. for the PROTECT AF Investigators. Percutaneous closure of the left atrial appendage versus warfarin therapy for  prevention of stroke in patients with atrial fibrillation: a randomised non-inferiority trial. Lancet 2009; 374: 534-42. Mechele Claude, Doshi SK, Abelardo Diesel D et al. on behalf of the PROTECT AF Investigators. Percutaneous Left Atrial Appendage Closure for Stroke Prophylaxis in Patients With Atrial Fibrillation 2.3-Year Follow-up of the PROTECT AF (Watchman Left Atrial Appendage System for Embolic Protection in Patients With Atrial Fibrillation) Trial. Circulation 2013; 127:720-729. - Alli O, Doshi S,  Kar S, Reddy VY, Sievert H et al. Quality of Life Assessment in the Randomized PROTECT AF (Percutaneous Closure of the Left Atrial Appendage Versus Warfarin Therapy for Prevention of Stroke in Patients With Atrial Fibrillation) Trial of Patients at Risk for Stroke With Nonvalvular Atrial Fibrillation. J Am Coll Cardiol 2013; 67:2094-7. Vertell Limber DR, Tarri Abernethy, Price M, [place], Sievert H, Doshi S, Huber K, Reddy V. Prospective randomized evaluation of the Watchman left atrial appendage Device in patients with atrial fibrillation versus long-term warfarin therapy; the PREVAIL trial. Journal of the [employer] of Cardiology, Vol. 4, No. 1, 2014, 1-11. - Kar S, Doshi SK, Sadhu A, Horton R, Osorio J et al. Primary outcome evaluation of a next-generation left atrial appendage closure device: results from the PINNACLE FLX trial. Circulation 2021;143(18)1754-1762.    After today's visit with the patient which was dedicated solely for shared decision making visit regarding LAA closure device, the patient decided to proceed with the LAA appendage closure procedure scheduled to be done in the near future at [facility]. Prior to the procedure, I would like to obtain a gated CT scan of the chest with contrast timed for PV/LA visualization.    HAS-BLED score 1 Hypertension No  Abnormal renal and liver function (Dialysis, transplant, Cr >2.26 mg/dL /Cirrhosis or Bilirubin >2x Normal or AST/ALT/AP >3x  Normal) No  Stroke No  Bleeding No  Labile INR (Unstable/high INR) No  Elderly (>65) Yes  Drugs or alcohol (? 8 drinks/week, anti-plt or NSAID) No   CHA2DS2-VASc Score = 3  [name] score is based upon: CHF History: 0 HTN History: 0 Diabetes History: 0 Stroke History: 0 Vascular Disease History: 0 Age Score: 2 Gender Score: 1    Medication Adjustments/Labs and Tests Ordered: Current medicines are reviewed at length with the patient today.  Concerns regarding medicines are outlined above.  No orders of the defined types were placed in this encounter.  No orders of the defined types were placed in this encounter.    Signed, Hilton Cork. Quentin Ore, MD, [facility], [facility]  11/03/2021 5:30 AM    Electrophysiology [facility]

## 2021-11-23 ENCOUNTER — Other Ambulatory Visit (HOSPITAL_BASED_OUTPATIENT_CLINIC_OR_DEPARTMENT_OTHER): Payer: Self-pay

## 2021-11-23 ENCOUNTER — Ambulatory Visit (HOSPITAL_COMMUNITY): Payer: Medicare PPO | Attending: Cardiology

## 2021-11-23 DIAGNOSIS — Z8711 Personal history of peptic ulcer disease: Secondary | ICD-10-CM | POA: Insufficient documentation

## 2021-11-23 DIAGNOSIS — I480 Paroxysmal atrial fibrillation: Secondary | ICD-10-CM | POA: Diagnosis not present

## 2021-11-23 DIAGNOSIS — Z01818 Encounter for other preprocedural examination: Secondary | ICD-10-CM | POA: Diagnosis not present

## 2021-11-23 LAB — ECHOCARDIOGRAM COMPLETE
Area-P 1/2: 4.49 cm2
S' Lateral: 3.1 cm

## 2021-12-03 ENCOUNTER — Other Ambulatory Visit (HOSPITAL_BASED_OUTPATIENT_CLINIC_OR_DEPARTMENT_OTHER): Payer: Self-pay

## 2021-12-03 MED ORDER — AREXVY 120 MCG/0.5ML IM SUSR
INTRAMUSCULAR | 0 refills | Status: DC
  Filled 2021-12-03: qty 1, 1d supply, fill #0

## 2021-12-18 ENCOUNTER — Other Ambulatory Visit: Payer: Self-pay

## 2021-12-18 ENCOUNTER — Other Ambulatory Visit (HOSPITAL_BASED_OUTPATIENT_CLINIC_OR_DEPARTMENT_OTHER): Payer: Self-pay

## 2021-12-18 MED ORDER — PREDNISONE 50 MG PO TABS
ORAL_TABLET | ORAL | 0 refills | Status: DC
  Filled 2021-12-18: qty 3, 1d supply, fill #0

## 2021-12-22 ENCOUNTER — Other Ambulatory Visit: Payer: Self-pay

## 2022-01-10 IMAGING — DX DG CHEST 2V
2 series · 2 of 2 positions shown · non-contrast
Comparison: None

CLINICAL DATA: Dizziness, tachycardia, chest pain, arrhythmia and
vomiting for 4 hours

EXAM:
CHEST - 2 VIEW

[chest lat]
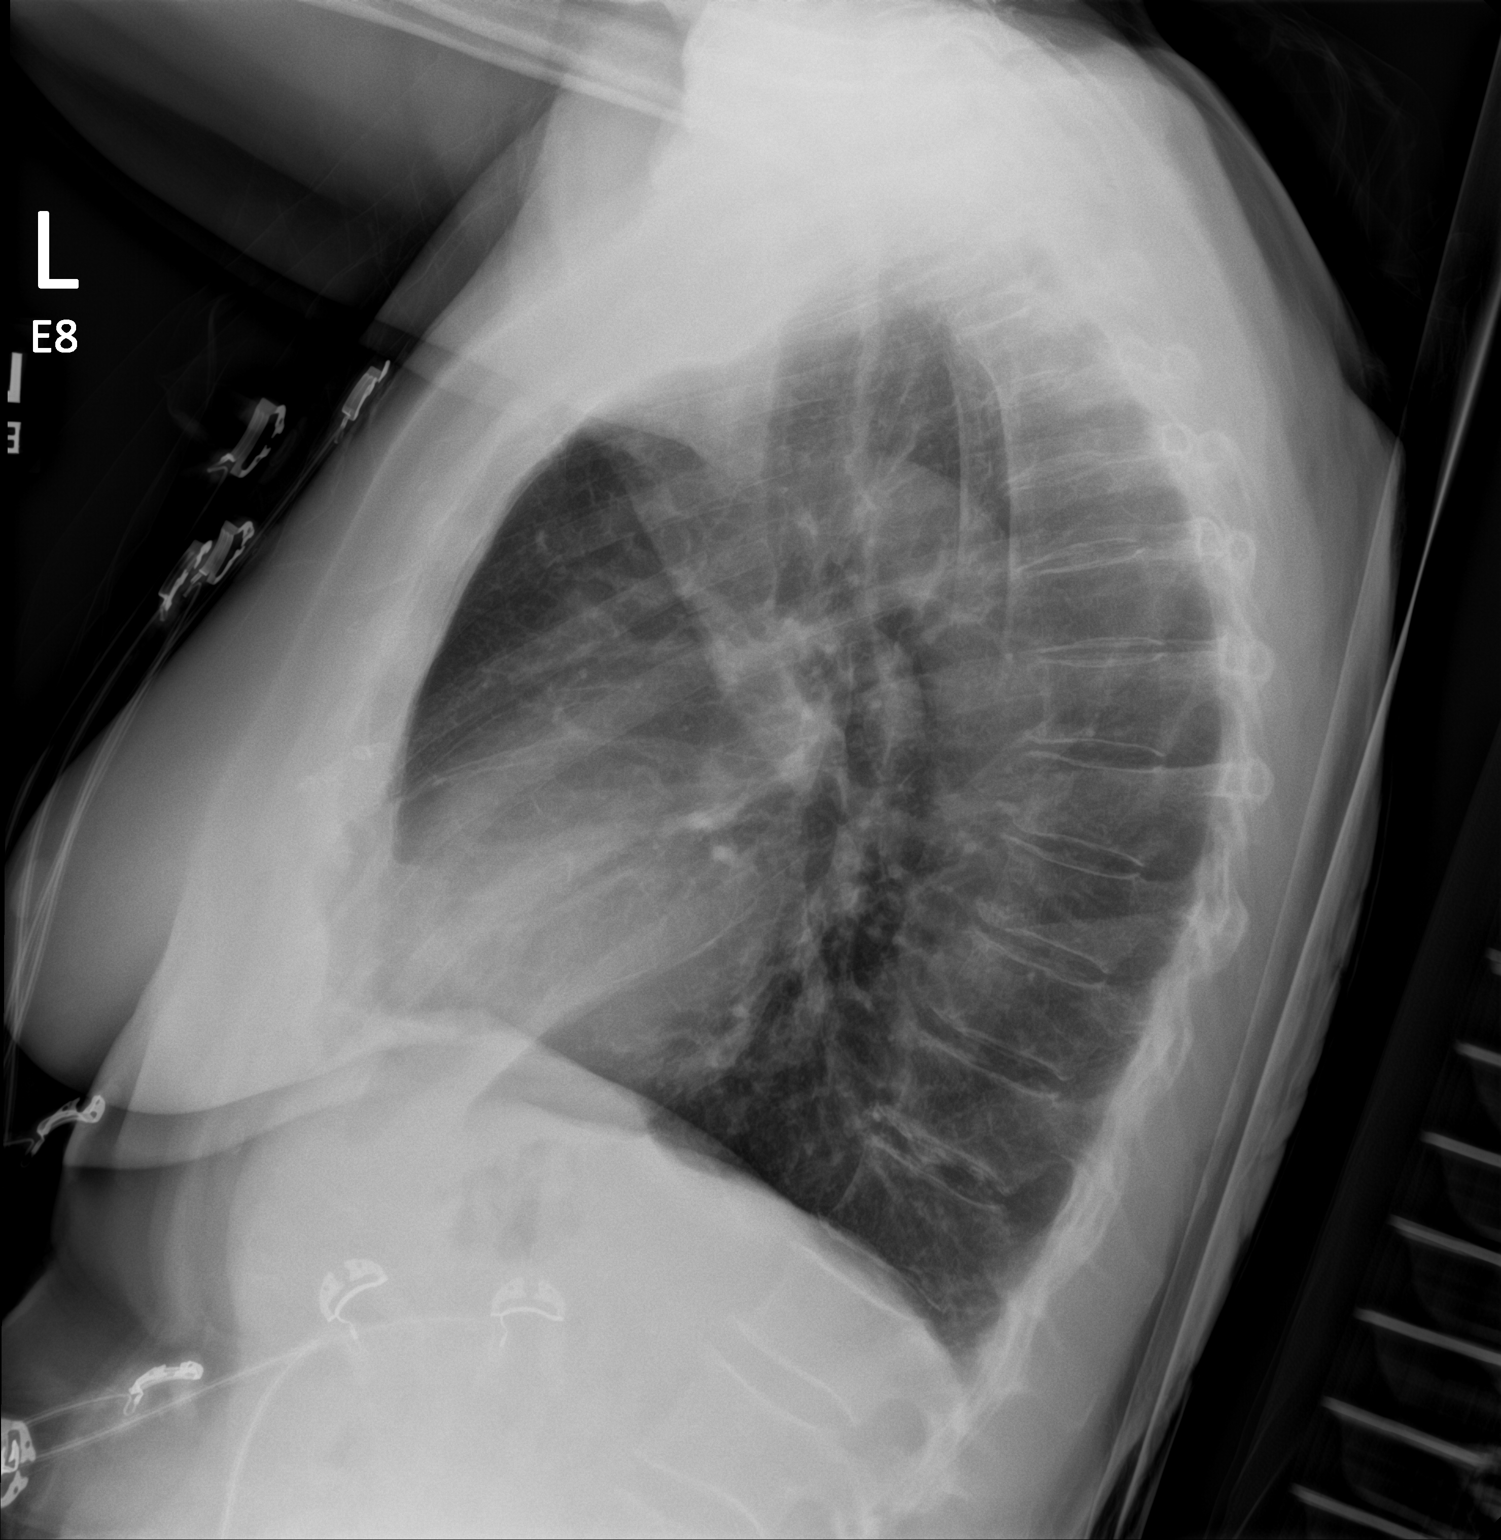

[chest ap]
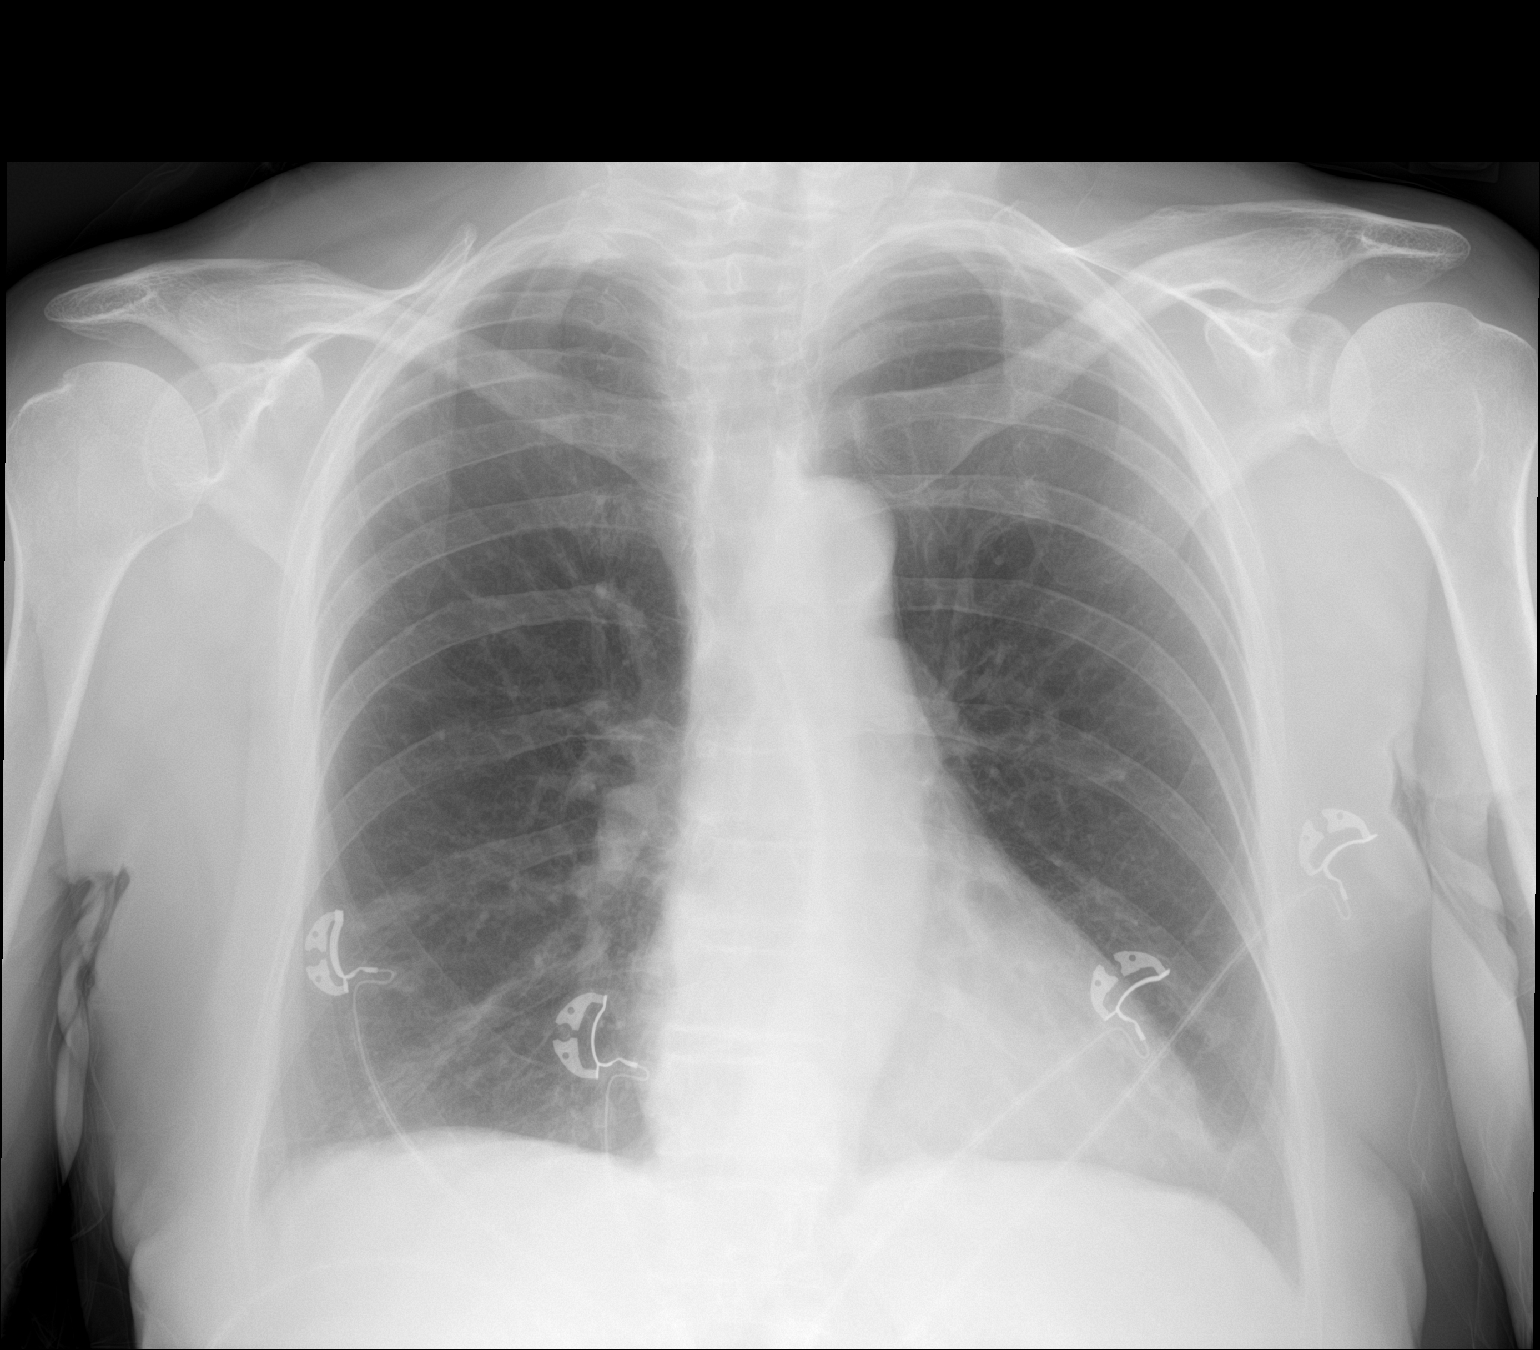

[2 of 2 positions shown; findings below may reference images not displayed]

FINDINGS: Normal heart size, mediastinal contours, and pulmonary vascularity.

Minimal subsegmental atelectasis at lung bases.

Lungs otherwise clear.

No pulmonary infiltrate, pleural effusion, or pneumothorax.

Osseous structures unremarkable.
IMPRESSION: Minimal bibasilar atelectasis.

## 2022-01-11 ENCOUNTER — Ambulatory Visit: Payer: Medicare PPO | Attending: Cardiology

## 2022-01-11 DIAGNOSIS — Z8711 Personal history of peptic ulcer disease: Secondary | ICD-10-CM

## 2022-01-11 DIAGNOSIS — Z01818 Encounter for other preprocedural examination: Secondary | ICD-10-CM

## 2022-01-11 DIAGNOSIS — I480 Paroxysmal atrial fibrillation: Secondary | ICD-10-CM

## 2022-01-11 LAB — CBC WITH DIFFERENTIAL/PLATELET

## 2022-01-12 LAB — BASIC METABOLIC PANEL
BUN/Creatinine Ratio: 13 (ref 12–28)
BUN: 12 mg/dL (ref 8–27)
CO2: 22 mmol/L (ref 20–29)
Calcium: 9.5 mg/dL (ref 8.7–10.3)
Chloride: 101 mmol/L (ref 96–106)
Creatinine, Ser: 0.96 mg/dL (ref 0.57–1.00)
Glucose: 105 mg/dL — ABNORMAL HIGH (ref 70–99)
Potassium: 3.9 mmol/L (ref 3.5–5.2)
Sodium: 139 mmol/L (ref 134–144)
eGFR: 61 mL/min/{1.73_m2} (ref >59)

## 2022-01-12 LAB — CBC WITH DIFFERENTIAL/PLATELET
Basophils Absolute: 0 10*3/uL (ref 0.0–0.2)
Basos: 0 %
EOS (ABSOLUTE): 0.2 10*3/uL (ref 0.0–0.4)
Eos: 2 %
Hematocrit: 43.9 % (ref 34.0–46.6)
Hemoglobin: 14.8 g/dL (ref 11.1–15.9)
Immature Grans (Abs): 0 10*3/uL (ref 0.0–0.1)
Immature Granulocytes: 0 %
Lymphocytes Absolute: 2.2 10*3/uL (ref 0.7–3.1)
Lymphs: 33 %
MCH: 31 pg (ref 26.6–33.0)
MCHC: 33.7 g/dL (ref 31.5–35.7)
MCV: 92 fL (ref 79–97)
Monocytes Absolute: 0.7 10*3/uL (ref 0.1–0.9)
Monocytes: 11 %
Neutrophils Absolute: 3.5 10*3/uL (ref 1.4–7.0)
Neutrophils: 54 %
Platelets: 252 10*3/uL (ref 150–450)
RBC: 4.78 x10E6/uL (ref 3.77–5.28)
RDW: 12.6 % (ref 11.7–15.4)
WBC: 6.6 10*3/uL (ref 3.4–10.8)

## 2022-01-20 ENCOUNTER — Other Ambulatory Visit (HOSPITAL_BASED_OUTPATIENT_CLINIC_OR_DEPARTMENT_OTHER): Payer: Self-pay

## 2022-01-20 ENCOUNTER — Other Ambulatory Visit: Payer: Self-pay | Admitting: Cardiology

## 2022-01-20 DIAGNOSIS — I480 Paroxysmal atrial fibrillation: Secondary | ICD-10-CM

## 2022-01-20 MED ORDER — APIXABAN 5 MG PO TABS
5.0000 mg | ORAL_TABLET | Freq: Two times a day (BID) | ORAL | 3 refills | Status: DC
  Filled 2022-01-20: qty 180, 90d supply, fill #0
  Filled 2022-04-27: qty 180, 90d supply, fill #1
  Filled 2022-07-29: qty 180, 90d supply, fill #2
  Filled 2022-10-23: qty 180, 90d supply, fill #3

## 2022-01-20 MED ORDER — DRONEDARONE HCL 400 MG PO TABS
400.0000 mg | ORAL_TABLET | Freq: Two times a day (BID) | ORAL | 2 refills | Status: DC
  Filled 2022-01-20 – 2022-02-22 (×2): qty 180, 90d supply, fill #0
  Filled 2022-05-24: qty 180, 90d supply, fill #1
  Filled 2022-08-20: qty 180, 90d supply, fill #2

## 2022-01-20 NOTE — Telephone Encounter (Signed)
Prescription refill request for Eliquis received. Indication: Afib  Last office visit: 11/03/21 Marlou Porch)  Scr: 0.96 (01/11/22)  Age: 77 Weight: 69.3kg  Appropriate dose and refill sent to requested pharmacy.

## 2022-01-22 ENCOUNTER — Other Ambulatory Visit (HOSPITAL_BASED_OUTPATIENT_CLINIC_OR_DEPARTMENT_OTHER): Payer: Self-pay

## 2022-01-22 ENCOUNTER — Other Ambulatory Visit: Payer: Self-pay

## 2022-01-27 ENCOUNTER — Telehealth (HOSPITAL_COMMUNITY): Payer: Self-pay | Admitting: *Deleted

## 2022-01-27 NOTE — Telephone Encounter (Signed)
Reaching out to patient to offer assistance regarding upcoming cardiac imaging study; pt verbalizes understanding of appt date/time, parking situation and where to check in, pre-test NPO status and medications ordered, and verified current allergies; name and call back number provided for further questions should they arise  Gordy Clement RN Navigator Cardiac [place] [facility] [phone] office [phone] cell  Reviewed how to take 13 hour prep with patient. She verbalized understanding and will arrive at 9:30am.

## 2022-01-29 ENCOUNTER — Ambulatory Visit (HOSPITAL_COMMUNITY)
Admission: RE | Admit: 2022-01-29 | Discharge: 2022-01-29 | Disposition: A | Discharge status: Home / Self Care | Payer: Medicare PPO | Source: Ambulatory Visit | Attending: Cardiology | Admitting: Cardiology

## 2022-01-29 DIAGNOSIS — I480 Paroxysmal atrial fibrillation: Secondary | ICD-10-CM | POA: Diagnosis not present

## 2022-01-29 DIAGNOSIS — Z8711 Personal history of peptic ulcer disease: Secondary | ICD-10-CM

## 2022-01-29 DIAGNOSIS — Z01818 Encounter for other preprocedural examination: Secondary | ICD-10-CM | POA: Diagnosis not present

## 2022-01-29 MED ORDER — IOHEXOL 350 MG/ML SOLN
100.0000 mL | Freq: Once | INTRAVENOUS | Status: AC | PRN
  Administered 2022-01-29: 100 mL via INTRAVENOUS

## 2022-02-01 ENCOUNTER — Telehealth: Payer: Self-pay | Admitting: Cardiology

## 2022-02-01 DIAGNOSIS — R911 Solitary pulmonary nodule: Secondary | ICD-10-CM

## 2022-02-01 NOTE — Telephone Encounter (Signed)
Left message for patient to call back  

## 2022-02-01 NOTE — Telephone Encounter (Signed)
Radiology Dept is calling to give a call report to triage team

## 2022-02-01 NOTE — Telephone Encounter (Signed)
Spoke with Diane from [facility] who would like to make Dr Quentin Ore aware of an over-read re: pt's cardiac CT as below. Copy of report taken to Dr Quentin Ore for review. ADDENDUM REPORT: 02/01/2022 09:36   EXAM: OVER-READ INTERPRETATION  CT CHEST   The following report is an over-read performed by radiologist Dr. Collene Leyden [facility], [place] on 02/01/2022. This over-read does not include interpretation of cardiac or coronary anatomy or pathology. The coronary CTA interpretation by the cardiologist is attached.

## 2022-02-02 NOTE — Telephone Encounter (Signed)
Left message for patient to call back  

## 2022-02-02 NOTE — Telephone Encounter (Signed)
Patient was returning call. Please advise ?

## 2022-02-02 NOTE — Telephone Encounter (Signed)
Per Dr. Quentin Ore: I think this ablation needs to be postponed until she sees pulmonology/lung nodule clinic     Spoke with the patient and made her aware.  Procedure has been cancelled. Referral placed to pulmonary

## 2022-02-05 ENCOUNTER — Encounter (HOSPITAL_COMMUNITY): Admission: RE | Payer: Self-pay | Source: Home / Self Care

## 2022-02-05 ENCOUNTER — Ambulatory Visit (HOSPITAL_COMMUNITY): Admission: RE | Admit: 2022-02-05 | Payer: Medicare PPO | Source: Home / Self Care | Admitting: Cardiology

## 2022-02-05 SURGERY — ATRIAL FIBRILLATION ABLATION
Anesthesia: General

## 2022-02-08 ENCOUNTER — Telehealth: Payer: Self-pay

## 2022-02-11 ENCOUNTER — Telehealth: Payer: Self-pay | Admitting: Cardiology

## 2022-02-11 NOTE — Telephone Encounter (Signed)
Patient wanted clarification pertaining to her appointment with the a-fib clinic on 2/14. Provided patient with the phone number to the clinic. Patient voiced understanding.

## 2022-02-11 NOTE — Telephone Encounter (Signed)
Patient's ablation was cancelled so no longer needs appointment with AFIB clinic.

## 2022-02-11 NOTE — Telephone Encounter (Signed)
Patient was calling asking bout what appt was for. Trying to explain to the patient what the appt notes says. Patient ended up hanging up. Please advise

## 2022-02-17 ENCOUNTER — Ambulatory Visit (HOSPITAL_COMMUNITY): Payer: Medicare PPO | Admitting: Physician Assistant

## 2022-02-22 ENCOUNTER — Ambulatory Visit: Payer: Medicare PPO | Admitting: Pulmonary Disease

## 2022-02-22 ENCOUNTER — Other Ambulatory Visit (HOSPITAL_BASED_OUTPATIENT_CLINIC_OR_DEPARTMENT_OTHER): Payer: Self-pay

## 2022-02-22 VITALS — BP 140/80 | HR 59 | Ht 63.5 in | Wt 155.0 lb

## 2022-02-22 DIAGNOSIS — Z789 Other specified health status: Secondary | ICD-10-CM

## 2022-02-22 DIAGNOSIS — I4891 Unspecified atrial fibrillation: Secondary | ICD-10-CM | POA: Diagnosis not present

## 2022-02-22 DIAGNOSIS — R911 Solitary pulmonary nodule: Secondary | ICD-10-CM | POA: Diagnosis not present

## 2022-02-22 NOTE — Patient Instructions (Signed)
Thank you for visiting Dr. Valeta Harms at [facility]. Today we recommend the following:  Orders Placed This Encounter  Procedures   NM PET Image Initial (PI) Skull Base To Thigh (F-18 FDG)   See [place] after the PET is complete   Return in about 3 weeks (around 03/15/2022) for with Eric Form, NP, or Dr. Valeta Harms.    Please do your part to reduce the spread of COVID-19.

## 2022-02-22 NOTE — Progress Notes (Signed)
Synopsis: Referred in 02/22/2022 for lung nodule by Vickie Epley, MD  Subjective:   PATIENT ID: Brittany Harper GENDER: female DOB: Feb 14, 1945, MRN: [id number]  Chief Complaint  Patient presents with   Consult    Lung nodule    77 yo FM, PMH asthma as a child, was being worked up for an ablation by EP.  Patient had a CT of the heart completed.  This revealed a new 12 mm spiculated nodule within the left upper lobe.  She has no other significant past medical history related to malignancy, has had basal cell skin cancer no melanoma, up-to-date on cancer screening.  She has been a lifelong non-smoker.  She currently resides [facility].    Past Medical History:  Diagnosis Date   Allergy    Arthritis    Asthma    Cancer ([place])    basal cell   Gastric ulcer without hemorrhage or perforation    GERD (gastroesophageal reflux disease)    Hiatal hernia with GERD and esophagitis 10/16/2018   Hx of skin cancer, basal cell    Hyperlipidemia    Memory change 04/05/2016     Family History  Problem Relation Age of Onset   Arthritis Mother    Breast cancer Mother        breast and ovarian, and basal cell   Ovarian cancer Mother    Basal cell carcinoma Mother    Arthritis Father    Basal cell carcinoma Father        basal cell   Heart attack Father    Diabetes Sister    Obesity Sister    Arthritis Sister    Diabetes Paternal Uncle    Diabetes Paternal Grandmother    Diabetes Paternal Grandfather    Stomach cancer Paternal Okey Regal' disease Maternal Grandfather    Heart attack Maternal Grandfather    Colon cancer Neg Hx    Pancreatic cancer Neg Hx      Past Surgical History:  Procedure Laterality Date   ABDOMINAL HYSTERECTOMY     BIOPSY  10/16/2018   Procedure: BIOPSY;  Surgeon: Lavena Bullion, DO;  Location: [facility];  Service: Gastroenterology;;   ESOPHAGOGASTRODUODENOSCOPY (EGD) WITH PROPOFOL N/A 10/16/2018   Procedure:  ESOPHAGOGASTRODUODENOSCOPY (EGD) WITH PROPOFOL;  Surgeon: Lavena Bullion, DO;  Location: [facility];  Service: Gastroenterology;  Laterality: N/A;   TRANSORAL INCISIONLESS FUNDOPLICATION N/A [id number]   Procedure: TRANSORAL INCISIONLESS FUNDOPLICATION;  Surgeon: Lavena Bullion, DO;  Location: [facility];  Service: Gastroenterology;  Laterality: N/A;    Social History   Socioeconomic History   Marital status: Widowed    Spouse name: Not on file   Number of children: 4   Years of education: 57   Highest education level: Master's degree (e.g., MA, MS, MEng, MEd, MSW, MBA)  Occupational History   Occupation: retired Pharmacist, hospital   Tobacco Use   Smoking status: Never   Smokeless tobacco: Never  Vaping Use   Vaping Use: Never used  Substance and Sexual Activity   Alcohol use: Yes    Alcohol/week: 2.0 - 3.0 standard drinks of alcohol    Types: 1 Glasses of wine, 1 - 2 Standard drinks or equivalent per week    Comment: occasionally   Drug use: No   Sexual activity: Not Currently  Other Topics Concern   Not on file  Social History Narrative   Lives in [place]. Actively involved with social interactions and the community.  Social Determinants of Health   Financial Resource Strain: Low Risk  (04/06/2021)   Overall Financial Resource Strain (CARDIA)    Difficulty of Paying Living Expenses: Not hard at all  Food Insecurity: No Food Insecurity (04/06/2021)   Hunger Vital Sign    Worried About Running Out of Food in the Last Year: Never true    Ran Out of Food in the Last Year: Never true  Transportation Needs: No Transportation Needs (04/06/2021)   PRAPARE - Hydrologist (Medical): No    Lack of Transportation (Non-Medical): No  Physical Activity: Sufficiently Active (04/06/2021)   Exercise Vital Sign    Days of Exercise per Week: 5 days    Minutes of Exercise per Session: 60 min  Stress: No Stress Concern Present (04/06/2021)    [place]    Feeling of Stress : Only a little  Social Connections: Moderately Integrated (04/06/2021)   Social Connection and Isolation Panel [NHANES]    Frequency of Communication with Friends and Family: More than three times a week    Frequency of Social Gatherings with Friends and Family: More than three times a week    Attends Religious Services: More than 4 times per year    Active Member of [employer] or Organizations: Yes    Attends Archivist Meetings: More than 4 times per year    Marital Status: Widowed  Intimate Partner Violence: Unknown (04/06/2021)   Humiliation, Afraid, Rape, and Kick questionnaire    Fear of Current or Ex-Partner: No    Emotionally Abused: No    Physically Abused: Not on file    Sexually Abused: No     Allergies  Allergen Reactions   Ivp Dye [Iodinated Contrast Media] Shortness Of Breath    Feels hot   Augmentin [Amoxicillin-Pot Clavulanate] Nausea And Vomiting    Did it involve swelling of the face/tongue/throat, SOB, or low BP? No Did it involve sudden or severe rash/hives, skin peeling, or any reaction on the inside of your mouth or nose? No Did you need to seek medical attention at a hospital or doctor's office? No When did it last happen?      20+ years If all above answers are "NO", may proceed with cephalosporin use.    Demerol [Meperidine] Nausea And Vomiting   Monosodium Glutamate Other (See Comments)    "Heart racing and hot feeling."   Morphine And Related Nausea And Vomiting   Shellfish Allergy      Outpatient Medications Prior to Visit  Medication Sig Dispense Refill   AMBULATORY NON FORMULARY MEDICATION Apply 1 application topically daily. Hempvanna lotion     apixaban (ELIQUIS) 5 MG TABS tablet Take 1 tablet (5 mg total) by mouth 2 (two) times daily. 180 tablet 3   Calcium Carbonate-Vit D-Min (CALCIUM 1200) 1200-1000 MG-UNIT CHEW Chew 2 tablets by mouth daily.      dronedarone (MULTAQ) 400 MG tablet Take 1 tablet (400 mg total) by mouth 2 (two) times daily with a meal. 180 tablet 2   fexofenadine (ALLEGRA) 180 MG tablet Take 180 mg by mouth daily.     hypromellose (SYSTANE OVERNIGHT THERAPY) 0.3 % GEL ophthalmic ointment Place 1 application into both eyes at bedtime.     metroNIDAZOLE (METROCREAM) 0.75 % cream Apply 1 application on the skin twice a day 45 g 11   Misc Natural Products (OSTEO BI-FLEX ADV JOINT SHIELD PO) Take 2 tablets by mouth daily  with lunch.      Multiple Vitamins-Minerals (CENTRUM SILVER ULTRA WOMENS PO) Take 1 tablet by mouth daily at 12 noon.      omeprazole (PRILOSEC) 40 MG capsule Take 1 capsule (40 mg total) by mouth daily. 90 capsule 3   Polyethyl Glycol-Propyl Glycol (SYSTANE) 0.4-0.3 % SOLN Place 1 drop into both eyes every morning.     simvastatin (ZOCOR) 5 MG tablet Take 1 tablet (5 mg total) by mouth daily. 330 tablet 0   predniSONE (DELTASONE) 50 MG tablet Take 1 tablet (7m) by mouth 13 hours prior to test, 7 hours prior to test and 1 hour prior to test 3 tablet 0   RSV vaccine recomb adjuvanted (AREXVY) 120 MCG/0.5ML injection Inject into the muscle. 1 mL 0   No facility-administered medications prior to visit.    Review of Systems  Constitutional:  Negative for chills, fever, malaise/fatigue and weight loss.  HENT:  Negative for hearing loss, sore throat and tinnitus.   Eyes:  Negative for blurred vision and double vision.  Respiratory:  Negative for cough, hemoptysis, sputum production, shortness of breath, wheezing and stridor.   Cardiovascular:  Negative for chest pain, palpitations, orthopnea, leg swelling and PND.  Gastrointestinal:  Negative for abdominal pain, constipation, diarrhea, heartburn, nausea and vomiting.  Genitourinary:  Negative for dysuria, hematuria and urgency.  Musculoskeletal:  Negative for joint pain and myalgias.  Skin:  Negative for itching and rash.  Neurological:  Negative for  dizziness, tingling, weakness and headaches.  Endo/Heme/Allergies:  Negative for environmental allergies. Does not bruise/bleed easily.  Psychiatric/Behavioral:  Negative for depression. The patient is not nervous/anxious and does not have insomnia.   All other systems reviewed and are negative.    Objective:  Physical Exam Vitals reviewed.  Constitutional:      General: She is not in acute distress.    Appearance: She is well-developed.  HENT:     Head: Normocephalic and atraumatic.  Eyes:     General: No scleral icterus.    Conjunctiva/sclera: Conjunctivae normal.     Pupils: Pupils are equal, round, and reactive to light.  Neck:     Vascular: No JVD.     Trachea: No tracheal deviation.  Cardiovascular:     Rate and Rhythm: Normal rate and regular rhythm.     Heart sounds: Normal heart sounds. No murmur heard. Pulmonary:     Effort: Pulmonary effort is normal. No tachypnea, accessory muscle usage or respiratory distress.     Breath sounds: No stridor. No wheezing, rhonchi or rales.  Abdominal:     General: There is no distension.     Palpations: Abdomen is soft.     Tenderness: There is no abdominal tenderness.  Musculoskeletal:        General: No tenderness.     Cervical back: Neck supple.  Lymphadenopathy:     Cervical: No cervical adenopathy.  Skin:    General: Skin is warm and dry.     Capillary Refill: Capillary refill takes less than 2 seconds.     Findings: No rash.  Neurological:     Mental Status: She is alert and oriented to person, place, and time.  Psychiatric:        Behavior: Behavior normal.      Vitals:   02/22/22 0837  BP: (!) 140/80  Pulse: (!) 59  SpO2: 98%  Weight: 155 lb (70.3 kg)  Height: 5' 3.5" (1.613 m)   98% on RA BMI Readings from Last 3  Encounters:  02/22/22 27.03 kg/m  11/03/21 26.23 kg/m  09/14/21 25.98 kg/m   Wt Readings from Last 3 Encounters:  02/22/22 155 lb (70.3 kg)  11/03/21 152 lb 12.8 oz (69.3 kg)  09/14/21  151 lb 6 oz (68.7 kg)     CBC    Component Value Date/Time   WBC 6.6 01/11/2022 1017   WBC 13.2 (H) 12/28/2020 0351   RBC 4.78 01/11/2022 1017   RBC 4.45 12/28/2020 0351   HGB 14.8 01/11/2022 1017   HCT 43.9 01/11/2022 1017   PLT 252 01/11/2022 1017   MCV 92 01/11/2022 1017   MCH 31.0 01/11/2022 1017   MCH 29.7 12/28/2020 0351   MCHC 33.7 01/11/2022 1017   MCHC 32.8 12/28/2020 0351   RDW 12.6 01/11/2022 1017   LYMPHSABS 2.2 01/11/2022 1017   MONOABS 1.2 (H) 12/28/2020 0351   EOSABS 0.2 01/11/2022 1017   BASOSABS 0.0 01/11/2022 1017     Chest Imaging: January 2024 CT heart: Left upper lobe spiculated 12 mm pulmonary nodule [name] images have been independently reviewed by me.    Pulmonary Functions Testing Results:     No data to display          FeNO:   Pathology:   Echocardiogram:   Heart Catheterization:     Assessment & Plan:     ICD-10-CM   1. Lung nodule  R91.1 NM PET Image Initial (PI) Skull Base To Thigh (F-18 FDG)    2. Nonsmoker  Z78.9     3. Atrial fibrillation with RVR (HCC)  I48.91       Discussion:  This is a 77 year old female seen today in the office for incidental pulmonary nodule.  No other significant past medical history.  She was being worked up for an ablation by EP.  CT imaging of the heart revealed a new 12 mm left upper lobe pulmonary nodule.  Plan: Today in the office we talked about her risks of the development of malignancy. She has no significant risk factors and a lifelong non-smoker no family history of cancer or lung cancer. I think next best step for restratification for her nodule is nuclear medicine pet imaging. Pending upon the pet imaging we can talk about consideration for biopsy. She can have the PET scan complete and then return to see [place] in a few weeks, see me or SG, NP after PET scan complete.   Current Outpatient Medications:    AMBULATORY NON FORMULARY MEDICATION, Apply 1 application topically  daily. Hempvanna lotion, Disp: , Rfl:    apixaban (ELIQUIS) 5 MG TABS tablet, Take 1 tablet (5 mg total) by mouth 2 (two) times daily., Disp: 180 tablet, Rfl: 3   Calcium Carbonate-Vit D-Min (CALCIUM 1200) 1200-1000 MG-UNIT CHEW, Chew 2 tablets by mouth daily., Disp: , Rfl:    dronedarone (MULTAQ) 400 MG tablet, Take 1 tablet (400 mg total) by mouth 2 (two) times daily with a meal., Disp: 180 tablet, Rfl: 2   fexofenadine (ALLEGRA) 180 MG tablet, Take 180 mg by mouth daily., Disp: , Rfl:    hypromellose (SYSTANE OVERNIGHT THERAPY) 0.3 % GEL ophthalmic ointment, Place 1 application into both eyes at bedtime., Disp: , Rfl:    metroNIDAZOLE (METROCREAM) 0.75 % cream, Apply 1 application on the skin twice a day, Disp: 45 g, Rfl: 11   Misc Natural Products (OSTEO BI-FLEX ADV JOINT SHIELD PO), Take 2 tablets by mouth daily with lunch. , Disp: , Rfl:    Multiple Vitamins-Minerals (CENTRUM SILVER  ULTRA WOMENS PO), Take 1 tablet by mouth daily at 12 noon. , Disp: , Rfl:    omeprazole (PRILOSEC) 40 MG capsule, Take 1 capsule (40 mg total) by mouth daily., Disp: 90 capsule, Rfl: 3   Polyethyl Glycol-Propyl Glycol (SYSTANE) 0.4-0.3 % SOLN, Place 1 drop into both eyes every morning., Disp: , Rfl:    simvastatin (ZOCOR) 5 MG tablet, Take 1 tablet (5 mg total) by mouth daily., Disp: 330 tablet, Rfl: 0   Garner Nash, DO [facility] 02/22/2022 8:56 AM

## 2022-03-08 ENCOUNTER — Telehealth: Payer: Self-pay | Admitting: Pulmonary Disease

## 2022-03-08 NOTE — Telephone Encounter (Signed)
Pt. Calling to ask if the PET scan she is having on Thur. Has iodine in it because she is Allergic

## 2022-03-08 NOTE — Telephone Encounter (Signed)
Dr Valeta Harms,  Does the PET scan that this patient is getting done this week does it have contrast dye involved.   She states she is allergic?  Please advise

## 2022-03-09 NOTE — Telephone Encounter (Signed)
ATC patient. Per DPR, left detailed vm to let patient know that the PET scan she is supposed to have this week does not have any contrast and if she had any other questions to give [place] a call back.   Nothing further needed.

## 2022-03-10 ENCOUNTER — Telehealth: Payer: Self-pay | Admitting: Pulmonary Disease

## 2022-03-10 NOTE — Telephone Encounter (Signed)
PT grateful for CB and apologizes for missing it. She was working the [employer]. Your message was appreciated. She expressed understanding. Nothing further needed.

## 2022-03-10 NOTE — Telephone Encounter (Signed)
Will close encounter

## 2022-03-11 ENCOUNTER — Encounter (HOSPITAL_COMMUNITY)
Admission: RE | Admit: 2022-03-11 | Discharge: 2022-03-11 | Disposition: A | Discharge status: Home / Self Care | Payer: Medicare PPO | Source: Ambulatory Visit | Attending: Pulmonary Disease | Admitting: Pulmonary Disease

## 2022-03-11 DIAGNOSIS — R911 Solitary pulmonary nodule: Secondary | ICD-10-CM | POA: Insufficient documentation

## 2022-03-11 LAB — GLUCOSE, CAPILLARY: Glucose-Capillary: 102 mg/dL — ABNORMAL HIGH (ref 70–99)

## 2022-03-11 MED ORDER — FLUDEOXYGLUCOSE F - 18 (FDG) INJECTION
7.7000 | Freq: Once | INTRAVENOUS | Status: AC
  Administered 2022-03-11: 7.7 via INTRAVENOUS

## 2022-03-14 NOTE — Progress Notes (Unsigned)
Synopsis: Referred in 02/22/2022 for lung nodule by [place], Blondell Reveal, MD  Subjective:   PATIENT ID: Brittany Harper GENDER: female DOB: Aug 29, 1945, MRN: [id number]  No chief complaint on file.   77 yo FM, PMH asthma as a child, was being worked up for an ablation by EP.  Patient had a CT of the heart completed.  This revealed a new 12 mm spiculated nodule within the left upper lobe.  She has no other significant past medical history related to malignancy, has had basal cell skin cancer no melanoma, up-to-date on cancer screening.  She has been a lifelong non-smoker.  She currently resides [facility].  OV 03/14/2022: Here today for follow-up after recent nuclear medicine pet imaging.  Was last seen in the office for evaluation of a 12 mm spiculated nodule within the left upper lobe.  Lifelong non-smoker with no other concern for malignancy however the nuclear medicine PET scan that was completed on 03/11/2022 reveals a 1.5 cm spiculated nodule in the posterior aspect of the left upper lobe that is hypermetabolic with an SUV max of 6.8 concerning for malignancy.     Past Medical History:  Diagnosis Date   Allergy    Arthritis    Asthma    Cancer ([place])    basal cell   Gastric ulcer without hemorrhage or perforation    GERD (gastroesophageal reflux disease)    Hiatal hernia with GERD and esophagitis 10/16/2018   Hx of skin cancer, basal cell    Hyperlipidemia    Memory change 04/05/2016     Family History  Problem Relation Age of Onset   Arthritis Mother    Breast cancer Mother        breast and ovarian, and basal cell   Ovarian cancer Mother    Basal cell carcinoma Mother    Arthritis Father    Basal cell carcinoma Father        basal cell   Heart attack Father    Diabetes Sister    Obesity Sister    Arthritis Sister    Diabetes Paternal Uncle    Diabetes Paternal Grandmother    Diabetes Paternal Grandfather    Stomach cancer Paternal Okey Regal'  disease Maternal Grandfather    Heart attack Maternal Grandfather    Colon cancer Neg Hx    Pancreatic cancer Neg Hx      Past Surgical History:  Procedure Laterality Date   ABDOMINAL HYSTERECTOMY     BIOPSY  10/16/2018   Procedure: BIOPSY;  Surgeon: Lavena Bullion, DO;  Location: [facility];  Service: Gastroenterology;;   ESOPHAGOGASTRODUODENOSCOPY (EGD) WITH PROPOFOL N/A 10/16/2018   Procedure: ESOPHAGOGASTRODUODENOSCOPY (EGD) WITH PROPOFOL;  Surgeon: Lavena Bullion, DO;  Location: [facility];  Service: Gastroenterology;  Laterality: N/A;   TRANSORAL INCISIONLESS FUNDOPLICATION N/A [id number]   Procedure: TRANSORAL INCISIONLESS FUNDOPLICATION;  Surgeon: Lavena Bullion, DO;  Location: [facility];  Service: Gastroenterology;  Laterality: N/A;    Social History   Socioeconomic History   Marital status: Widowed    Spouse name: Not on file   Number of children: 4   Years of education: 24   Highest education level: Master's degree (e.g., MA, MS, MEng, MEd, MSW, MBA)  Occupational History   Occupation: retired Pharmacist, hospital   Tobacco Use   Smoking status: Never   Smokeless tobacco: Never  Vaping Use   Vaping Use: Never used  Substance and Sexual Activity   Alcohol use: Yes  Alcohol/week: 2.0 - 3.0 standard drinks of alcohol    Types: 1 Glasses of wine, 1 - 2 Standard drinks or equivalent per week    Comment: occasionally   Drug use: No   Sexual activity: Not Currently  Other Topics Concern   Not on file  Social History Narrative   Lives in [place]. Actively involved with social interactions and the community.    Social Determinants of Health   Financial Resource Strain: Low Risk  (04/06/2021)   Overall Financial Resource Strain (CARDIA)    Difficulty of Paying Living Expenses: Not hard at all  Food Insecurity: No Food Insecurity (04/06/2021)   Hunger Vital Sign    Worried About Running Out of Food in the Last Year: Never true    Ran Out  of Food in the Last Year: Never true  Transportation Needs: No Transportation Needs (04/06/2021)   PRAPARE - Hydrologist (Medical): No    Lack of Transportation (Non-Medical): No  Physical Activity: Sufficiently Active (04/06/2021)   Exercise Vital Sign    Days of Exercise per Week: 5 days    Minutes of Exercise per Session: 60 min  Stress: No Stress Concern Present (04/06/2021)   [place]    Feeling of Stress : Only a little  Social Connections: Moderately Integrated (04/06/2021)   Social Connection and Isolation Panel [NHANES]    Frequency of Communication with Friends and Family: More than three times a week    Frequency of Social Gatherings with Friends and Family: More than three times a week    Attends Religious Services: More than 4 times per year    Active Member of [employer] or Organizations: Yes    Attends Archivist Meetings: More than 4 times per year    Marital Status: Widowed  Intimate Partner Violence: Unknown (04/06/2021)   Humiliation, Afraid, Rape, and Kick questionnaire    Fear of Current or Ex-Partner: No    Emotionally Abused: No    Physically Abused: Not on file    Sexually Abused: No     Allergies  Allergen Reactions   Ivp Dye [Iodinated Contrast Media] Shortness Of Breath    Feels hot   Augmentin [Amoxicillin-Pot Clavulanate] Nausea And Vomiting    Did it involve swelling of the face/tongue/throat, SOB, or low BP? No Did it involve sudden or severe rash/hives, skin peeling, or any reaction on the inside of your mouth or nose? No Did you need to seek medical attention at a hospital or doctor's office? No When did it last happen?      20+ years If all above answers are "NO", may proceed with cephalosporin use.    Demerol [Meperidine] Nausea And Vomiting   Monosodium Glutamate Other (See Comments)    "Heart racing and hot feeling."   Morphine And Related Nausea  And Vomiting   Shellfish Allergy      Outpatient Medications Prior to Visit  Medication Sig Dispense Refill   AMBULATORY NON FORMULARY MEDICATION Apply 1 application topically daily. Hempvanna lotion     apixaban (ELIQUIS) 5 MG TABS tablet Take 1 tablet (5 mg total) by mouth 2 (two) times daily. 180 tablet 3   Calcium Carbonate-Vit D-Min (CALCIUM 1200) 1200-1000 MG-UNIT CHEW Chew 2 tablets by mouth daily.     dronedarone (MULTAQ) 400 MG tablet Take 1 tablet (400 mg total) by mouth 2 (two) times daily with a meal. 180 tablet 2  fexofenadine (ALLEGRA) 180 MG tablet Take 180 mg by mouth daily.     hypromellose (SYSTANE OVERNIGHT THERAPY) 0.3 % GEL ophthalmic ointment Place 1 application into both eyes at bedtime.     metroNIDAZOLE (METROCREAM) 0.75 % cream Apply 1 application on the skin twice a day 45 g 11   Misc Natural Products (OSTEO BI-FLEX ADV JOINT SHIELD PO) Take 2 tablets by mouth daily with lunch.      Multiple Vitamins-Minerals (CENTRUM SILVER ULTRA WOMENS PO) Take 1 tablet by mouth daily at 12 noon.      omeprazole (PRILOSEC) 40 MG capsule Take 1 capsule (40 mg total) by mouth daily. 90 capsule 3   Polyethyl Glycol-Propyl Glycol (SYSTANE) 0.4-0.3 % SOLN Place 1 drop into both eyes every morning.     simvastatin (ZOCOR) 5 MG tablet Take 1 tablet (5 mg total) by mouth daily. 330 tablet 0   No facility-administered medications prior to visit.    ROS   Objective:  Physical Exam   There were no vitals filed for this visit.    on RA BMI Readings from Last 3 Encounters:  02/22/22 27.03 kg/m  11/03/21 26.23 kg/m  09/14/21 25.98 kg/m   Wt Readings from Last 3 Encounters:  02/22/22 155 lb (70.3 kg)  11/03/21 152 lb 12.8 oz (69.3 kg)  09/14/21 151 lb 6 oz (68.7 kg)     CBC    Component Value Date/Time   WBC 6.6 01/11/2022 1017   WBC 13.2 (H) 12/28/2020 0351   RBC 4.78 01/11/2022 1017   RBC 4.45 12/28/2020 0351   HGB 14.8 01/11/2022 1017   HCT 43.9 01/11/2022 1017    PLT 252 01/11/2022 1017   MCV 92 01/11/2022 1017   MCH 31.0 01/11/2022 1017   MCH 29.7 12/28/2020 0351   MCHC 33.7 01/11/2022 1017   MCHC 32.8 12/28/2020 0351   RDW 12.6 01/11/2022 1017   LYMPHSABS 2.2 01/11/2022 1017   MONOABS 1.2 (H) 12/28/2020 0351   EOSABS 0.2 01/11/2022 1017   BASOSABS 0.0 01/11/2022 1017     Chest Imaging: January 2024 CT heart: Left upper lobe spiculated 12 mm pulmonary nodule [name] images have been independently reviewed by me.    Nuclear medicine pet imaging March [id number]: Hypermetabolic 1.5 cm upper lobe left-sided pulmonary nodule with an SUV max of 6.8 concerning for malignancy. [name] images have been independently reviewed by me.    Pulmonary Functions Testing Results:     No data to display          FeNO:   Pathology:   Echocardiogram:   Heart Catheterization:     Assessment & Plan:     ICD-10-CM   1. Lung nodule  R91.1     2. Nonsmoker  Z78.9     3. Abnormal PET scan of lung  R94.2       Discussion:  This is a 77 year old female seen today in the office for incidental pulmonary nodule.  No other significant past medical history.  She was being worked up for an ablation by EP.  CT imaging of the heart revealed a new 12 mm left upper lobe pulmonary nodule.  Plan: Today in the office we talked about her risks of the development of malignancy. She has no significant risk factors and a lifelong non-smoker no family history of cancer or lung cancer. I think next best step for restratification for her nodule is nuclear medicine pet imaging. Pending upon the pet imaging we can talk about  consideration for biopsy. She can have the PET scan complete and then return to see [place] in a few weeks, see me or SG, NP after PET scan complete.   Current Outpatient Medications:    AMBULATORY NON FORMULARY MEDICATION, Apply 1 application topically daily. Hempvanna lotion, Disp: , Rfl:    apixaban (ELIQUIS) 5 MG TABS tablet, Take  1 tablet (5 mg total) by mouth 2 (two) times daily., Disp: 180 tablet, Rfl: 3   Calcium Carbonate-Vit D-Min (CALCIUM 1200) 1200-1000 MG-UNIT CHEW, Chew 2 tablets by mouth daily., Disp: , Rfl:    dronedarone (MULTAQ) 400 MG tablet, Take 1 tablet (400 mg total) by mouth 2 (two) times daily with a meal., Disp: 180 tablet, Rfl: 2   fexofenadine (ALLEGRA) 180 MG tablet, Take 180 mg by mouth daily., Disp: , Rfl:    hypromellose (SYSTANE OVERNIGHT THERAPY) 0.3 % GEL ophthalmic ointment, Place 1 application into both eyes at bedtime., Disp: , Rfl:    metroNIDAZOLE (METROCREAM) 0.75 % cream, Apply 1 application on the skin twice a day, Disp: 45 g, Rfl: 11   Misc Natural Products (OSTEO BI-FLEX ADV JOINT SHIELD PO), Take 2 tablets by mouth daily with lunch. , Disp: , Rfl:    Multiple Vitamins-Minerals (CENTRUM SILVER ULTRA WOMENS PO), Take 1 tablet by mouth daily at 12 noon. , Disp: , Rfl:    omeprazole (PRILOSEC) 40 MG capsule, Take 1 capsule (40 mg total) by mouth daily., Disp: 90 capsule, Rfl: 3   Polyethyl Glycol-Propyl Glycol (SYSTANE) 0.4-0.3 % SOLN, Place 1 drop into both eyes every morning., Disp: , Rfl:    simvastatin (ZOCOR) 5 MG tablet, Take 1 tablet (5 mg total) by mouth daily., Disp: 330 tablet, Rfl: 0   Garner Nash, DO [facility] 03/14/2022 8:03 PM

## 2022-03-15 ENCOUNTER — Encounter: Payer: Self-pay | Admitting: Pulmonary Disease

## 2022-03-15 ENCOUNTER — Ambulatory Visit: Payer: Medicare PPO | Admitting: Cardiology

## 2022-03-15 ENCOUNTER — Ambulatory Visit: Payer: Medicare PPO | Admitting: Pulmonary Disease

## 2022-03-15 VITALS — BP 100/70 | HR 58 | Ht 63.5 in | Wt 154.4 lb

## 2022-03-15 DIAGNOSIS — R942 Abnormal results of pulmonary function studies: Secondary | ICD-10-CM

## 2022-03-15 DIAGNOSIS — Z789 Other specified health status: Secondary | ICD-10-CM

## 2022-03-15 DIAGNOSIS — R911 Solitary pulmonary nodule: Secondary | ICD-10-CM | POA: Diagnosis not present

## 2022-03-15 NOTE — Patient Instructions (Signed)
Thank you for visiting Dr. Valeta Harms at [facility]. Today we recommend the following:  Orders Placed This Encounter  Procedures   Ambulatory referral to Cardiothoracic Surgery   Pulmonary Function Test   PFTS ASAP prior to surgery eval   Return in about 3 months (around 06/15/2022) for with Eric Form, NP, or Dr. Valeta Harms.    Please do your part to reduce the spread of COVID-19.

## 2022-03-16 ENCOUNTER — Other Ambulatory Visit (HOSPITAL_BASED_OUTPATIENT_CLINIC_OR_DEPARTMENT_OTHER): Payer: Self-pay

## 2022-03-22 DIAGNOSIS — H18513 Endothelial corneal dystrophy, bilateral: Secondary | ICD-10-CM | POA: Diagnosis not present

## 2022-03-22 DIAGNOSIS — H524 Presbyopia: Secondary | ICD-10-CM | POA: Diagnosis not present

## 2022-03-22 DIAGNOSIS — H26493 Other secondary cataract, bilateral: Secondary | ICD-10-CM | POA: Diagnosis not present

## 2022-03-22 DIAGNOSIS — H52203 Unspecified astigmatism, bilateral: Secondary | ICD-10-CM | POA: Diagnosis not present

## 2022-03-22 DIAGNOSIS — H43813 Vitreous degeneration, bilateral: Secondary | ICD-10-CM | POA: Diagnosis not present

## 2022-03-22 DIAGNOSIS — H04123 Dry eye syndrome of bilateral lacrimal glands: Secondary | ICD-10-CM | POA: Diagnosis not present

## 2022-03-26 ENCOUNTER — Institutional Professional Consult (permissible substitution): Payer: Medicare PPO | Admitting: Thoracic Surgery (Cardiothoracic Vascular Surgery)

## 2022-03-26 ENCOUNTER — Encounter: Payer: Self-pay | Admitting: *Deleted

## 2022-03-26 ENCOUNTER — Other Ambulatory Visit (HOSPITAL_BASED_OUTPATIENT_CLINIC_OR_DEPARTMENT_OTHER): Payer: Self-pay

## 2022-03-26 ENCOUNTER — Other Ambulatory Visit (HOSPITAL_BASED_OUTPATIENT_CLINIC_OR_DEPARTMENT_OTHER): Payer: Self-pay | Admitting: Nurse Practitioner

## 2022-03-26 ENCOUNTER — Other Ambulatory Visit: Payer: Self-pay | Admitting: *Deleted

## 2022-03-26 ENCOUNTER — Other Ambulatory Visit: Payer: Self-pay | Admitting: Thoracic Surgery (Cardiothoracic Vascular Surgery)

## 2022-03-26 ENCOUNTER — Ambulatory Visit (INDEPENDENT_AMBULATORY_CARE_PROVIDER_SITE_OTHER): Payer: Medicare PPO | Admitting: Pulmonary Disease

## 2022-03-26 VITALS — BP 138/85 | HR 59 | Resp 20 | Ht 63.0 in | Wt 150.0 lb

## 2022-03-26 DIAGNOSIS — R911 Solitary pulmonary nodule: Secondary | ICD-10-CM

## 2022-03-26 DIAGNOSIS — E782 Mixed hyperlipidemia: Secondary | ICD-10-CM

## 2022-03-26 LAB — PULMONARY FUNCTION TEST
DL/VA % pred: 88 %
DL/VA: 3.94 ml/min/mmHg/L
DLCO cor % pred: 138 %
DLCO cor: 17.75 ml/min/mmHg
DLCO unc % pred: 144 %
DLCO unc: 18.46 ml/min/mmHg
FEF 25-75 Post: 2.37 L/sec
FEF 25-75 Pre: 2.08 L/sec
FEF2575-%Change-Post: 13 %
FEF2575-%Pred-Post: 247 %
FEF2575-%Pred-Pre: 217 %
FEV1-%Change-Post: 7 %
FEV1-%Pred-Post: 204 %
FEV1-%Pred-Pre: 190 %
FEV1-Post: 2.23 L
FEV1-Pre: 2.09 L
FEV1FVC-%Change-Post: 6 %
FEV1FVC-%Pred-Pre: 105 %
FEV6-%Change-Post: 0 %
FEV6-%Pred-Post: 189 %
FEV6-%Pred-Pre: 187 %
FEV6-Post: 2.66 L
FEV6-Pre: 2.64 L
FEV6FVC-%Pred-Post: 107 %
FEV6FVC-%Pred-Pre: 107 %
FVC-%Change-Post: 0 %
FVC-%Pred-Post: 175 %
FVC-%Pred-Pre: 175 %
FVC-Post: 2.66 L
FVC-Pre: 2.66 L
Post FEV1/FVC ratio: 84 %
Post FEV6/FVC ratio: 100 %
Pre FEV1/FVC ratio: 78 %
Pre FEV6/FVC Ratio: 100 %
RV % pred: 137 %
RV: 2.5 L
TLC % pred: 146 %
TLC: 5.12 L

## 2022-03-26 NOTE — Patient Instructions (Signed)
Full PFT performed today. °

## 2022-03-26 NOTE — Progress Notes (Signed)
[address]       [name] [address]             [phone]                    Brittany Harper [place] Medical Record [id number] Date of Birth: 05/29/1945  Referring: Brittany Nash, DO Primary Care: de [place], Brittany Reveal, MD Primary Cardiologist: Brittany Furbish, MD  Chief Complaint:    Chief Complaint  Patient presents with   Lung Lesion    New patient consultation Cardiac CT 1/26, PET 3/7, PFTs 3/22    History of Present Illness:    Brittany Harper 77 y.o. female presents for surgical evaluation of a 1.2 cm left upper lobe pulmonary nodule that was found incidentally.  She was being worked more than ablation, and underwent cross-sectional imaging which identified the mass.  She is a lifelong smoker, but was only exposed to secondhand smoke as a child.  She denies any respiratory symptoms.  She denies any neurologic symptoms or weight changes.     Past Medical History:  Diagnosis Date   Allergy    Arthritis    Asthma    Cancer ([place])    basal cell   Gastric ulcer without hemorrhage or perforation    GERD (gastroesophageal reflux disease)    Hiatal hernia with GERD and esophagitis 10/16/2018   Hx of skin cancer, basal cell    Hyperlipidemia    Memory change 04/05/2016    Past Surgical History:  Procedure Laterality Date   ABDOMINAL HYSTERECTOMY     BIOPSY  10/16/2018   Procedure: BIOPSY;  Surgeon: Lavena Bullion, DO;  [facility];  Service: Gastroenterology;;   ESOPHAGOGASTRODUODENOSCOPY (EGD) WITH PROPOFOL N/A 10/16/2018   Procedure: ESOPHAGOGASTRODUODENOSCOPY (EGD) WITH PROPOFOL;  Surgeon: Lavena Bullion, DO;  [facility];  Service: Gastroenterology;  Laterality: N/A;   TRANSORAL INCISIONLESS FUNDOPLICATION N/A [id number]   Procedure: TRANSORAL INCISIONLESS FUNDOPLICATION;  Surgeon: Lavena Bullion, DO;  Location: [facility];  Service: Gastroenterology;  Laterality: N/A;    Family History  Problem Relation Age  of Onset   Arthritis Mother    Breast cancer Mother        breast and ovarian, and basal cell   Ovarian cancer Mother    Basal cell carcinoma Mother    Arthritis Father    Basal cell carcinoma Father        basal cell   Heart attack Father    Diabetes Sister    Obesity Sister    Arthritis Sister    Diabetes Paternal Uncle    Diabetes Paternal Grandmother    Diabetes Paternal Grandfather    Stomach cancer Paternal Okey Regal' disease Maternal Grandfather    Heart attack Maternal Grandfather    Colon cancer Neg Hx    Pancreatic cancer Neg Hx      Social History   Tobacco Use  Smoking Status Never  Smokeless Tobacco Never    Social History   Substance and Sexual Activity  Alcohol Use Yes   Alcohol/week: 2.0 - 3.0 standard drinks of alcohol   Types: 1 Glasses of wine, 1 - 2 Standard drinks or equivalent per week   Comment: occasionally     Allergies  Allergen Reactions   Ivp Dye [Iodinated Contrast Media] Shortness Of Breath    Feels hot   Augmentin [Amoxicillin-Pot Clavulanate] Nausea And Vomiting    Did it involve  swelling of the face/tongue/throat, SOB, or low BP? No Did it involve sudden or severe rash/hives, skin peeling, or any reaction on the inside of your mouth or nose? No Did you need to seek medical attention at a hospital or doctor's office? No When did it last happen?      20+ years If all above answers are "NO", may proceed with cephalosporin use.    Demerol [Meperidine] Nausea And Vomiting   Monosodium Glutamate Other (See Comments)    "Heart racing and hot feeling."   Morphine And Related Nausea And Vomiting   Shellfish Allergy     Current Outpatient Medications  Medication Sig Dispense Refill   AMBULATORY NON FORMULARY MEDICATION Apply 1 application topically daily. Hempvanna lotion     apixaban (ELIQUIS) 5 MG TABS tablet Take 1 tablet (5 mg total) by mouth 2 (two) times daily. 180 tablet 3   Calcium Carbonate-Vit D-Min (CALCIUM 1200)  1200-1000 MG-UNIT CHEW Chew 2 tablets by mouth daily.     dronedarone (MULTAQ) 400 MG tablet Take 1 tablet (400 mg total) by mouth 2 (two) times daily with a meal. 180 tablet 2   fexofenadine (ALLEGRA) 180 MG tablet Take 180 mg by mouth daily.     hypromellose (SYSTANE OVERNIGHT THERAPY) 0.3 % GEL ophthalmic ointment Place 1 application into both eyes at bedtime.     metroNIDAZOLE (METROCREAM) 0.75 % cream Apply 1 application on the skin twice a day 45 g 11   Misc Natural Products (OSTEO BI-FLEX ADV JOINT SHIELD PO) Take 2 tablets by mouth daily with lunch.      Multiple Vitamins-Minerals (CENTRUM SILVER ULTRA WOMENS PO) Take 1 tablet by mouth daily at 12 noon.      omeprazole (PRILOSEC) 40 MG capsule Take 1 capsule (40 mg total) by mouth daily. 90 capsule 3   Polyethyl Glycol-Propyl Glycol (SYSTANE) 0.4-0.3 % SOLN Place 1 drop into both eyes every morning.     simvastatin (ZOCOR) 5 MG tablet Take 1 tablet (5 mg total) by mouth daily. 330 tablet 0   No current facility-administered medications for this visit.    Review of Systems  Constitutional: Negative.   Respiratory: Negative.    Cardiovascular: Negative.   Neurological: Negative.      PHYSICAL EXAMINATION: BP 138/85 (BP Location: Left Arm, Patient Position: Sitting)   Pulse (!) 59   Resp 20   Ht 5\' 3"  (1.6 m)   Wt 150 lb (68 kg)   SpO2 97% Comment: RA  BMI 26.57 kg/m  Physical Exam Constitutional:      Appearance: Normal appearance. She is normal weight.  HENT:     Head: Normocephalic and atraumatic.  Eyes:     Extraocular Movements: Extraocular movements intact.  Cardiovascular:     Rate and Rhythm: Bradycardia present.  Pulmonary:     Effort: Pulmonary effort is normal. No respiratory distress.  Abdominal:     General: Abdomen is flat. There is no distension.  Musculoskeletal:        General: Normal range of motion.     Cervical back: Normal range of motion.  Skin:    General: Skin is warm and dry.   Neurological:     General: No focal deficit present.     Mental Status: She is alert and oriented to person, place, and time.     Diagnostic Studies & Laboratory data:     Recent Radiology Findings:   NM PET Image Initial (PI) Skull Base To Thigh (F-18 FDG)  Result Date: 03/12/2022 CLINICAL DATA:  Initial treatment strategy for left lung nodule. EXAM: NUCLEAR MEDICINE PET SKULL BASE TO THIGH TECHNIQUE: 7.8 mCi F-18 FDG was injected intravenously. Full-ring PET imaging was performed from the skull base to thigh after the radiotracer. CT data was obtained and used for attenuation correction and anatomic localization. Fasting blood glucose: 102 mg/dl COMPARISON:  Cardiac CT on 01/29/2022 FINDINGS: Mediastinal blood-pool activity (background): SUV max = 3.4 Liver activity (reference): SUV max = N/A NECK:  No hypermetabolic lymph nodes or masses. Incidental CT findings:  None. CHEST: Solitary spiculated pulmonary nodule is seen in the posterior left upper lobe abutting the major fissure. This measures 1.5 x 1.2 cm on image 34/7, and shows hypermetabolic activity with SUV max of 6.8. No other suspicious pulmonary nodules identified on CT images. No hypermetabolic lymph nodes within the thorax. Incidental CT findings: Moderate hiatal hernia. Aortic and coronary atherosclerotic calcification incidentally noted. ABDOMEN/PELVIS: No abnormal hypermetabolic activity within the liver, pancreas, adrenal glands, or spleen. No hypermetabolic lymph nodes in the abdomen or pelvis. Incidental CT findings: Tiny calcified gallstone noted, however there is no evidence of cholecystitis. Prior hysterectomy noted. SKELETON: No focal hypermetabolic bone lesions to suggest skeletal metastasis. Incidental CT findings:  None. IMPRESSION: 1.5 cm spiculated pulmonary nodule in posterior left upper lobe is hypermetabolic, consistent with primary bronchogenic carcinoma. No evidence of thoracic lymph node or distant metastatic disease.  Moderate hiatal hernia. Cholelithiasis. No radiographic evidence of cholecystitis. Aortic Atherosclerosis (ICD10-I70.0). Electronically Signed   By: Marlaine Hind M.D.   On: 03/12/2022 10:36       I have independently reviewed the above radiology studies  and reviewed the findings with the patient.   Recent Lab Findings: Lab Results  Component Value Date   WBC 6.6 01/11/2022   HGB 14.8 01/11/2022   HCT 43.9 01/11/2022   PLT 252 01/11/2022   GLUCOSE 105 (H) 01/11/2022   CHOL 182 03/31/2020   TRIG 108 03/31/2020   HDL 67 03/31/2020   LDLCALC 93 03/31/2020   ALT 22 01/13/2021   AST 14 01/13/2021   NA 139 01/11/2022   K 3.9 01/11/2022   CL 101 01/11/2022   CREATININE 0.96 01/11/2022   BUN 12 01/11/2022   CO2 22 01/11/2022   TSH 1.149 12/28/2020     PFTs: Pending  - FVC: 175% - FEV1: 190% -DLCO: 138%   CHEST: Solitary spiculated pulmonary nodule is seen in the posterior left upper lobe abutting the major fissure. This measures 1.5 x 1.2 cm on image 34/7, and shows hypermetabolic activity with SUV max of 6.8. No other suspicious pulmonary nodules identified on CT images.    Assessment / Plan:   77 year old female with a 1.5 cm left upper lobe pulmonary nodule.  There is increased avidity on PET/CT.  She has excellent pulmonary function testing.  We discussed several options for diagnosis and treatment.  The nodule is within the fissure however there is very close proximity to the pulmonary artery.  I think that this will be a very difficult wedge resection, and offered her the possibility of undergoing a navigational bronchoscopy with biopsy prior to surgery.  She stated that regardless of the results she would like this to removed.  I further explained that the only way to remove this safely would be via lobectomy, and she is comfortable with proceeding with that plan even if this is not a cancer.  Of note she also has a moderate-sized hiatal hernia and reflux symptoms.  She  has undergone an endoscopic fundoplication in the past.  She is not concerned about this at this point.  She is agree to proceed with a left robotic assisted thoracoscopy with left upper lobectomy.  I  spent 40 minutes with  the patient face to face in counseling and coordination of care.    Lajuana Matte 03/26/2022 4:54 PM

## 2022-03-26 NOTE — H&P (View-Only) (Signed)
    [address]       [name] [address]             [phone]                    Brittany Harper [facility] Medical Record #[id number] Date of Birth: 10/19/1945  Referring: Icard, Bradley L, DO Primary Care: de [place], Raymond J, MD Primary Cardiologist: Mark Skains, MD  Chief Complaint:    Chief Complaint  Patient presents with   Lung Lesion    New patient consultation Cardiac CT 1/26, PET 3/7, PFTs 3/22    History of Present Illness:    Brittany Harper 77 y.o. female presents for surgical evaluation of a 1.2 cm left upper lobe pulmonary nodule that was found incidentally.  She was being worked more than ablation, and underwent cross-sectional imaging which identified the mass.  She is a lifelong smoker, but was only exposed to secondhand smoke as a child.  She denies any respiratory symptoms.  She denies any neurologic symptoms or weight changes.     Past Medical History:  Diagnosis Date   Allergy    Arthritis    Asthma    Cancer (HCC)    basal cell   Gastric ulcer without hemorrhage or perforation    GERD (gastroesophageal reflux disease)    Hiatal hernia with GERD and esophagitis 10/16/2018   Hx of skin cancer, basal cell    Hyperlipidemia    Memory change 04/05/2016    Past Surgical History:  Procedure Laterality Date   ABDOMINAL HYSTERECTOMY     BIOPSY  10/16/2018   Procedure: BIOPSY;  Surgeon: Cirigliano, Vito V, DO;  Location: [facility];  Service: Gastroenterology;;   ESOPHAGOGASTRODUODENOSCOPY (EGD) WITH PROPOFOL N/A 10/16/2018   Procedure: ESOPHAGOGASTRODUODENOSCOPY (EGD) WITH PROPOFOL;  Surgeon: Cirigliano, Vito V, DO;  Location: [facility];  Service: Gastroenterology;  Laterality: N/A;   TRANSORAL INCISIONLESS FUNDOPLICATION N/A 10/16/2018   Procedure: TRANSORAL INCISIONLESS FUNDOPLICATION;  Surgeon: Cirigliano, Vito V, DO;  Location: [facility];  Service: Gastroenterology;  Laterality: N/A;    Family History  Problem Relation Age  of Onset   Arthritis Mother    Breast cancer Mother        breast and ovarian, and basal cell   Ovarian cancer Mother    Basal cell carcinoma Mother    Arthritis Father    Basal cell carcinoma Father        basal cell   Heart attack Father    Diabetes Sister    Obesity Sister    Arthritis Sister    Diabetes Paternal Uncle    Diabetes Paternal Grandmother    Diabetes Paternal Grandfather    Stomach cancer Paternal Aunt    Graves' disease Maternal Grandfather    Heart attack Maternal Grandfather    Colon cancer Neg Hx    Pancreatic cancer Neg Hx      Social History   Tobacco Use  Smoking Status Never  Smokeless Tobacco Never    Social History   Substance and Sexual Activity  Alcohol Use Yes   Alcohol/week: 2.0 - 3.0 standard drinks of alcohol   Types: 1 Glasses of wine, 1 - 2 Standard drinks or equivalent per week   Comment: occasionally     Allergies  Allergen Reactions   Ivp Dye [Iodinated Contrast Media] Shortness Of Breath    Feels hot   Augmentin [Amoxicillin-Pot Clavulanate] Nausea And Vomiting    Did it involve   swelling of the face/tongue/throat, SOB, or low BP? No Did it involve sudden or severe rash/hives, skin peeling, or any reaction on the inside of your mouth or nose? No Did you need to seek medical attention at a hospital or doctor's office? No When did it last happen?      20+ years If all above answers are "NO", may proceed with cephalosporin use.    Demerol [Meperidine] Nausea And Vomiting   Monosodium Glutamate Other (See Comments)    "Heart racing and hot feeling."   Morphine And Related Nausea And Vomiting   Shellfish Allergy     Current Outpatient Medications  Medication Sig Dispense Refill   AMBULATORY NON FORMULARY MEDICATION Apply 1 application topically daily. Hempvanna lotion     apixaban (ELIQUIS) 5 MG TABS tablet Take 1 tablet (5 mg total) by mouth 2 (two) times daily. 180 tablet 3   Calcium Carbonate-Vit D-Min (CALCIUM 1200)  1200-1000 MG-UNIT CHEW Chew 2 tablets by mouth daily.     dronedarone (MULTAQ) 400 MG tablet Take 1 tablet (400 mg total) by mouth 2 (two) times daily with a meal. 180 tablet 2   fexofenadine (ALLEGRA) 180 MG tablet Take 180 mg by mouth daily.     hypromellose (SYSTANE OVERNIGHT THERAPY) 0.3 % GEL ophthalmic ointment Place 1 application into both eyes at bedtime.     metroNIDAZOLE (METROCREAM) 0.75 % cream Apply 1 application on the skin twice a day 45 g 11   Misc Natural Products (OSTEO BI-FLEX ADV JOINT SHIELD PO) Take 2 tablets by mouth daily with lunch.      Multiple Vitamins-Minerals (CENTRUM SILVER ULTRA WOMENS PO) Take 1 tablet by mouth daily at 12 noon.      omeprazole (PRILOSEC) 40 MG capsule Take 1 capsule (40 mg total) by mouth daily. 90 capsule 3   Polyethyl Glycol-Propyl Glycol (SYSTANE) 0.4-0.3 % SOLN Place 1 drop into both eyes every morning.     simvastatin (ZOCOR) 5 MG tablet Take 1 tablet (5 mg total) by mouth daily. 330 tablet 0   No current facility-administered medications for this visit.    Review of Systems  Constitutional: Negative.   Respiratory: Negative.    Cardiovascular: Negative.   Neurological: Negative.      PHYSICAL EXAMINATION: BP 138/85 (BP Location: Left Arm, Patient Position: Sitting)   Pulse (!) 59   Resp 20   Ht 5' 3" (1.6 m)   Wt 150 lb (68 kg)   SpO2 97% Comment: RA  BMI 26.57 kg/m  Physical Exam Constitutional:      Appearance: Normal appearance. She is normal weight.  HENT:     Head: Normocephalic and atraumatic.  Eyes:     Extraocular Movements: Extraocular movements intact.  Cardiovascular:     Rate and Rhythm: Bradycardia present.  Pulmonary:     Effort: Pulmonary effort is normal. No respiratory distress.  Abdominal:     General: Abdomen is flat. There is no distension.  Musculoskeletal:        General: Normal range of motion.     Cervical back: Normal range of motion.  Skin:    General: Skin is warm and dry.   Neurological:     General: No focal deficit present.     Mental Status: She is alert and oriented to person, place, and time.     Diagnostic Studies & Laboratory data:     Recent Radiology Findings:   NM PET Image Initial (PI) Skull Base To Thigh (F-18 FDG)    Result Date: 03/12/2022 CLINICAL DATA:  Initial treatment strategy for left lung nodule. EXAM: NUCLEAR MEDICINE PET SKULL BASE TO THIGH TECHNIQUE: 7.8 mCi F-18 FDG was injected intravenously. Full-ring PET imaging was performed from the skull base to thigh after the radiotracer. CT data was obtained and used for attenuation correction and anatomic localization. Fasting blood glucose: 102 mg/dl COMPARISON:  Cardiac CT on 01/29/2022 FINDINGS: Mediastinal blood-pool activity (background): SUV max = 3.4 Liver activity (reference): SUV max = N/A NECK:  No hypermetabolic lymph nodes or masses. Incidental CT findings:  None. CHEST: Solitary spiculated pulmonary nodule is seen in the posterior left upper lobe abutting the major fissure. This measures 1.5 x 1.2 cm on image 34/7, and shows hypermetabolic activity with SUV max of 6.8. No other suspicious pulmonary nodules identified on CT images. No hypermetabolic lymph nodes within the thorax. Incidental CT findings: Moderate hiatal hernia. Aortic and coronary atherosclerotic calcification incidentally noted. ABDOMEN/PELVIS: No abnormal hypermetabolic activity within the liver, pancreas, adrenal glands, or spleen. No hypermetabolic lymph nodes in the abdomen or pelvis. Incidental CT findings: Tiny calcified gallstone noted, however there is no evidence of cholecystitis. Prior hysterectomy noted. SKELETON: No focal hypermetabolic bone lesions to suggest skeletal metastasis. Incidental CT findings:  None. IMPRESSION: 1.5 cm spiculated pulmonary nodule in posterior left upper lobe is hypermetabolic, consistent with primary bronchogenic carcinoma. No evidence of thoracic lymph node or distant metastatic disease.  Moderate hiatal hernia. Cholelithiasis. No radiographic evidence of cholecystitis. Aortic Atherosclerosis (ICD10-I70.0). Electronically Signed   By: John A Stahl M.D.   On: 03/12/2022 10:36       I have independently reviewed the above radiology studies  and reviewed the findings with the patient.   Recent Lab Findings: Lab Results  Component Value Date   WBC 6.6 01/11/2022   HGB 14.8 01/11/2022   HCT 43.9 01/11/2022   PLT 252 01/11/2022   GLUCOSE 105 (H) 01/11/2022   CHOL 182 03/31/2020   TRIG 108 03/31/2020   HDL 67 03/31/2020   LDLCALC 93 03/31/2020   ALT 22 01/13/2021   AST 14 01/13/2021   NA 139 01/11/2022   K 3.9 01/11/2022   CL 101 01/11/2022   CREATININE 0.96 01/11/2022   BUN 12 01/11/2022   CO2 22 01/11/2022   TSH 1.149 12/28/2020     PFTs: Pending  - FVC: 175% - FEV1: 190% -DLCO: 138%   CHEST: Solitary spiculated pulmonary nodule is seen in the posterior left upper lobe abutting the major fissure. This measures 1.5 x 1.2 cm on image 34/7, and shows hypermetabolic activity with SUV max of 6.8. No other suspicious pulmonary nodules identified on CT images.    Assessment / Plan:   76-year-old female with a 1.5 cm left upper lobe pulmonary nodule.  There is increased avidity on PET/CT.  She has excellent pulmonary function testing.  We discussed several options for diagnosis and treatment.  The nodule is within the fissure however there is very close proximity to the pulmonary artery.  I think that this will be a very difficult wedge resection, and offered her the possibility of undergoing a navigational bronchoscopy with biopsy prior to surgery.  She stated that regardless of the results she would like this to removed.  I further explained that the only way to remove this safely would be via lobectomy, and she is comfortable with proceeding with that plan even if this is not a cancer.  Of note she also has a moderate-sized hiatal hernia and reflux symptoms.  She    has undergone an endoscopic fundoplication in the past.  She is not concerned about this at this point.  She is agree to proceed with a left robotic assisted thoracoscopy with left upper lobectomy.  I  spent 40 minutes with  the patient face to face in counseling and coordination of care.    Kala Gassmann O Iliani Vejar 03/26/2022 4:54 PM        

## 2022-03-26 NOTE — Progress Notes (Signed)
Full PFT performed today. °

## 2022-03-29 ENCOUNTER — Telehealth (HOSPITAL_COMMUNITY): Payer: Self-pay | Admitting: *Deleted

## 2022-03-29 ENCOUNTER — Other Ambulatory Visit (HOSPITAL_BASED_OUTPATIENT_CLINIC_OR_DEPARTMENT_OTHER): Payer: Self-pay

## 2022-03-29 ENCOUNTER — Encounter (HOSPITAL_COMMUNITY): Payer: Self-pay | Admitting: Thoracic Surgery (Cardiothoracic Vascular Surgery)

## 2022-03-29 NOTE — Telephone Encounter (Signed)
Left message on voicemail per DPR in reference to upcoming appointment scheduled on  03/31/22 with detailed instructions given per Myocardial Perfusion Study Information Sheet for the test. LM to arrive 15 minutes early, and that it is imperative to arrive on time for appointment to keep from having the test rescheduled. If you need to cancel or reschedule your appointment, please call the office within 24 hours of your appointment. Failure to do so may result in a cancellation of your appointment, and a $50 no show fee. Phone number given for call back for any questions. Kirstie Peri

## 2022-03-30 ENCOUNTER — Other Ambulatory Visit (HOSPITAL_BASED_OUTPATIENT_CLINIC_OR_DEPARTMENT_OTHER): Payer: Self-pay

## 2022-03-30 ENCOUNTER — Other Ambulatory Visit (HOSPITAL_BASED_OUTPATIENT_CLINIC_OR_DEPARTMENT_OTHER): Payer: Self-pay | Admitting: Family Medicine

## 2022-03-30 DIAGNOSIS — E782 Mixed hyperlipidemia: Secondary | ICD-10-CM

## 2022-03-30 MED ORDER — SIMVASTATIN 5 MG PO TABS
5.0000 mg | ORAL_TABLET | Freq: Every day | ORAL | 0 refills | Status: DC
  Filled 2022-03-30: qty 30, 30d supply, fill #0

## 2022-03-31 ENCOUNTER — Ambulatory Visit (HOSPITAL_COMMUNITY): Payer: Medicare PPO | Attending: Cardiology

## 2022-03-31 DIAGNOSIS — Z01810 Encounter for preprocedural cardiovascular examination: Secondary | ICD-10-CM | POA: Insufficient documentation

## 2022-03-31 DIAGNOSIS — R911 Solitary pulmonary nodule: Secondary | ICD-10-CM | POA: Diagnosis not present

## 2022-03-31 LAB — MYOCARDIAL PERFUSION IMAGING
LV dias vol: 72 mL (ref 46–106)
LV sys vol: 28 mL
Nuc Stress EF: 62 %
Peak HR: 90 {beats}/min
Rest HR: 45 {beats}/min
Rest Nuclear Isotope Dose: 10.6 mCi
SDS: 2
SRS: 2
SSS: 4
ST Depression (mm): 0 mm
Stress Nuclear Isotope Dose: 32 mCi
TID: 0.97

## 2022-03-31 MED ORDER — TECHNETIUM TC 99M TETROFOSMIN IV KIT
32.0000 | PACK | Freq: Once | INTRAVENOUS | Status: AC | PRN
  Administered 2022-03-31: 32 via INTRAVENOUS

## 2022-03-31 MED ORDER — TECHNETIUM TC 99M TETROFOSMIN IV KIT
10.6000 | PACK | Freq: Once | INTRAVENOUS | Status: AC | PRN
  Administered 2022-03-31: 10.6 via INTRAVENOUS

## 2022-03-31 MED ORDER — REGADENOSON 0.4 MG/5ML IV SOLN
0.4000 mg | Freq: Once | INTRAVENOUS | Status: AC
  Administered 2022-03-31: 0.4 mg via INTRAVENOUS

## 2022-04-02 NOTE — Pre-Procedure Instructions (Signed)
Surgical Instructions    Your procedure is scheduled on April 07, 2022.  Report to [facility] Main Entrance "A" at 10:30 A.M., then check in with the Admitting office.  Call this number if you have problems the morning of surgery:  [phone]  If you have any questions prior to your surgery date call [phone]: Open Monday-Friday 8am-4pm If you experience any cold or flu symptoms such as cough, fever, chills, shortness of breath, etc. between now and your scheduled surgery, please notify us at the above number.     Remember:  Do not eat or drink after midnight the night before your surgery      Take these medicines the morning of surgery with A SIP OF WATER:  dronedarone (MULTAQ)   fexofenadine (ALLEGRA)   omeprazole (PRILOSEC)   Polyethyl Glycol-Propyl Glycol (SYSTANE) eye drops  simvastatin (ZOCOR)    STOP taking your apixaban (ELIQUIS) three days prior to surgery. Your last dose of this medication will be March 30th.   As of today, STOP taking any Aspirin (unless otherwise instructed by your surgeon) Aleve, Naproxen, Ibuprofen, Motrin, Advil, Goody's, BC's, all herbal medications, fish oil, and all vitamins.                     Do NOT Smoke (Tobacco/Vaping) for 24 hours prior to your procedure.  If you use a CPAP at night, you may bring your mask/headgear for your overnight stay.   Contacts, glasses, piercing's, hearing aid's, dentures or partials may not be worn into surgery, please bring cases for these belongings.    For patients admitted to the hospital, discharge time will be determined by your treatment team.   Patients discharged the day of surgery will not be allowed to drive home, and someone needs to stay with them for 24 hours.  SURGICAL WAITING ROOM VISITATION Patients having surgery or a procedure may have no more than 2 support people in the waiting area - these visitors may rotate.   Children under the age of 14 must have an adult with them who is not  the patient. If the patient needs to stay at the hospital during part of their recovery, the visitor guidelines for inpatient rooms apply. Pre-op nurse will coordinate an appropriate time for 1 support person to accompany patient in pre-op.  This support person may not rotate.   Please refer to the [facility] website for the visitor guidelines for Inpatients (after your surgery is over and you are in a regular room).    Special instructions:   [place]- Preparing For Surgery  Before surgery, you can play an important role. Because skin is not sterile, your skin needs to be as free of germs as possible. You can reduce the number of germs on your skin by washing with CHG (chlorahexidine gluconate) Soap before surgery.  CHG is an antiseptic cleaner which kills germs and bonds with the skin to continue killing germs even after washing.    Oral Hygiene is also important to reduce your risk of infection.  Remember - BRUSH YOUR TEETH THE MORNING OF SURGERY WITH YOUR REGULAR TOOTHPASTE  Please do not use if you have an allergy to CHG or antibacterial soaps. If your skin becomes reddened/irritated stop using the CHG.  Do not shave (including legs and underarms) for at least 48 hours prior to first CHG shower. It is OK to shave your face.  Please follow these instructions carefully.   Shower the [employer] SURGERY and  the MORNING OF SURGERY  If you chose to wash your hair, wash your hair first as usual with your normal shampoo.  After you shampoo, rinse your hair and body thoroughly to remove the shampoo.  Use CHG Soap as you would any other liquid soap. You can apply CHG directly to the skin and wash gently with a scrungie or a clean washcloth.   Apply the CHG Soap to your body ONLY FROM THE NECK DOWN.  Do not use on open wounds or open sores. Avoid contact with your eyes, ears, mouth and genitals (private parts). Wash Face and genitals (private parts)  with your normal soap.   Wash  thoroughly, paying special attention to the area where your surgery will be performed.  Thoroughly rinse your body with warm water from the neck down.  DO NOT shower/wash with your normal soap after using and rinsing off the CHG Soap.  Pat yourself dry with a CLEAN TOWEL.  Wear CLEAN PAJAMAS to bed the night before surgery  Place CLEAN SHEETS on your bed the night before your surgery  DO NOT SLEEP WITH PETS.   Day of Surgery: Take a shower with CHG soap. Do not wear jewelry or makeup Do not wear lotions, powders, perfumes/colognes, or deodorant. Do not shave 48 hours prior to surgery.  Men may shave face and neck. Do not bring valuables to the hospital.  [facility] is not responsible for any belongings or valuables. Do not wear nail polish, gel polish, artificial nails, or any other type of covering on natural nails (fingers and toes) If you have artificial nails or gel coating that need to be removed by a nail salon, please have this removed prior to surgery. Artificial nails or gel coating may interfere with anesthesia's ability to adequately monitor your vital signs.  Wear Clean/Comfortable clothing the morning of surgery Remember to brush your teeth WITH YOUR REGULAR TOOTHPASTE.   Please read over the following fact sheets that you were given.    If you received a COVID test during your pre-op visit  it is requested that you wear a mask when out in public, stay away from anyone that may not be feeling well and notify your surgeon if you develop symptoms. If you have been in contact with anyone that has tested positive in the last 10 days please notify you surgeon.

## 2022-04-05 ENCOUNTER — Ambulatory Visit (HOSPITAL_COMMUNITY)
Admission: RE | Admit: 2022-04-05 | Discharge: 2022-04-05 | Disposition: A | Discharge status: Home / Self Care | Payer: Medicare PPO | Source: Ambulatory Visit | Attending: Thoracic Surgery (Cardiothoracic Vascular Surgery) | Admitting: Thoracic Surgery (Cardiothoracic Vascular Surgery)

## 2022-04-05 ENCOUNTER — Other Ambulatory Visit: Payer: Self-pay

## 2022-04-05 ENCOUNTER — Encounter (HOSPITAL_COMMUNITY): Payer: Self-pay

## 2022-04-05 ENCOUNTER — Encounter (HOSPITAL_COMMUNITY)
Admission: RE | Admit: 2022-04-05 | Discharge: 2022-04-05 | Disposition: A | Discharge status: Home / Self Care | Payer: Medicare PPO | Source: Ambulatory Visit | Attending: Thoracic Surgery (Cardiothoracic Vascular Surgery) | Admitting: Thoracic Surgery (Cardiothoracic Vascular Surgery)

## 2022-04-05 VITALS — BP 133/78 | HR 69 | Temp 98.4°F | Resp 18 | Ht 63.0 in | Wt 153.1 lb

## 2022-04-05 DIAGNOSIS — J8410 Pulmonary fibrosis, unspecified: Secondary | ICD-10-CM | POA: Diagnosis present

## 2022-04-05 DIAGNOSIS — Z1152 Encounter for screening for COVID-19: Secondary | ICD-10-CM | POA: Diagnosis not present

## 2022-04-05 DIAGNOSIS — Z803 Family history of malignant neoplasm of breast: Secondary | ICD-10-CM | POA: Diagnosis not present

## 2022-04-05 DIAGNOSIS — Z833 Family history of diabetes mellitus: Secondary | ICD-10-CM | POA: Diagnosis not present

## 2022-04-05 DIAGNOSIS — Z8041 Family history of malignant neoplasm of ovary: Secondary | ICD-10-CM | POA: Diagnosis not present

## 2022-04-05 DIAGNOSIS — Z8711 Personal history of peptic ulcer disease: Secondary | ICD-10-CM | POA: Diagnosis not present

## 2022-04-05 DIAGNOSIS — J850 Gangrene and necrosis of lung: Secondary | ICD-10-CM | POA: Diagnosis present

## 2022-04-05 DIAGNOSIS — Z01818 Encounter for other preprocedural examination: Secondary | ICD-10-CM | POA: Diagnosis not present

## 2022-04-05 DIAGNOSIS — K449 Diaphragmatic hernia without obstruction or gangrene: Secondary | ICD-10-CM | POA: Diagnosis present

## 2022-04-05 DIAGNOSIS — Z87891 Personal history of nicotine dependence: Secondary | ICD-10-CM | POA: Diagnosis not present

## 2022-04-05 DIAGNOSIS — Z7901 Long term (current) use of anticoagulants: Secondary | ICD-10-CM | POA: Diagnosis not present

## 2022-04-05 DIAGNOSIS — Z8261 Family history of arthritis: Secondary | ICD-10-CM | POA: Diagnosis not present

## 2022-04-05 DIAGNOSIS — M1990 Unspecified osteoarthritis, unspecified site: Secondary | ICD-10-CM | POA: Diagnosis present

## 2022-04-05 DIAGNOSIS — Z808 Family history of malignant neoplasm of other organs or systems: Secondary | ICD-10-CM | POA: Diagnosis not present

## 2022-04-05 DIAGNOSIS — K219 Gastro-esophageal reflux disease without esophagitis: Secondary | ICD-10-CM | POA: Diagnosis present

## 2022-04-05 DIAGNOSIS — J939 Pneumothorax, unspecified: Secondary | ICD-10-CM | POA: Diagnosis not present

## 2022-04-05 DIAGNOSIS — R911 Solitary pulmonary nodule: Secondary | ICD-10-CM | POA: Diagnosis present

## 2022-04-05 DIAGNOSIS — E785 Hyperlipidemia, unspecified: Secondary | ICD-10-CM | POA: Diagnosis present

## 2022-04-05 DIAGNOSIS — R001 Bradycardia, unspecified: Secondary | ICD-10-CM | POA: Diagnosis present

## 2022-04-05 DIAGNOSIS — I4891 Unspecified atrial fibrillation: Secondary | ICD-10-CM | POA: Diagnosis present

## 2022-04-05 DIAGNOSIS — Z79899 Other long term (current) drug therapy: Secondary | ICD-10-CM | POA: Diagnosis not present

## 2022-04-05 DIAGNOSIS — Z91041 Radiographic dye allergy status: Secondary | ICD-10-CM | POA: Diagnosis not present

## 2022-04-05 DIAGNOSIS — Z800 Family history of malignant neoplasm of digestive organs: Secondary | ICD-10-CM | POA: Diagnosis not present

## 2022-04-05 DIAGNOSIS — J45909 Unspecified asthma, uncomplicated: Secondary | ICD-10-CM | POA: Diagnosis present

## 2022-04-05 DIAGNOSIS — Z85828 Personal history of other malignant neoplasm of skin: Secondary | ICD-10-CM | POA: Diagnosis not present

## 2022-04-05 DIAGNOSIS — Z885 Allergy status to narcotic agent status: Secondary | ICD-10-CM | POA: Diagnosis not present

## 2022-04-05 DIAGNOSIS — Z8249 Family history of ischemic heart disease and other diseases of the circulatory system: Secondary | ICD-10-CM | POA: Diagnosis not present

## 2022-04-05 HISTORY — DX: Cardiac arrhythmia, unspecified: I49.9

## 2022-04-05 LAB — URINALYSIS, ROUTINE W REFLEX MICROSCOPIC
Bilirubin Urine: NEGATIVE
Glucose, UA: NEGATIVE mg/dL
Hgb urine dipstick: NEGATIVE
Ketones, ur: NEGATIVE mg/dL
Nitrite: NEGATIVE
Protein, ur: NEGATIVE mg/dL
Specific Gravity, Urine: 1.015 (ref 1.005–1.030)
pH: 5 (ref 5.0–8.0)

## 2022-04-05 LAB — CBC
HCT: 44.1 % (ref 36.0–46.0)
Hemoglobin: 14.4 g/dL (ref 12.0–15.0)
MCH: 30.4 pg (ref 26.0–34.0)
MCHC: 32.7 g/dL (ref 30.0–36.0)
MCV: 93.2 fL (ref 80.0–100.0)
Platelets: 260 10*3/uL (ref 150–400)
RBC: 4.73 MIL/uL (ref 3.87–5.11)
RDW: 12.5 % (ref 11.5–15.5)
WBC: 7.8 10*3/uL (ref 4.0–10.5)
nRBC: 0 % (ref 0.0–0.2)

## 2022-04-05 LAB — COMPREHENSIVE METABOLIC PANEL
ALT: 19 U/L (ref 0–44)
AST: 27 U/L (ref 15–41)
Albumin: 4 g/dL (ref 3.5–5.0)
Alkaline Phosphatase: 61 U/L (ref 38–126)
Anion gap: 12 (ref 5–15)
BUN: 17 mg/dL (ref 8–23)
CO2: 28 mmol/L (ref 22–32)
Calcium: 9.4 mg/dL (ref 8.9–10.3)
Chloride: 100 mmol/L (ref 98–111)
Creatinine, Ser: 1.05 mg/dL — ABNORMAL HIGH (ref 0.44–1.00)
GFR, Estimated: 54 mL/min — ABNORMAL LOW (ref >60)
Glucose, Bld: 119 mg/dL — ABNORMAL HIGH (ref 70–99)
Potassium: 4.4 mmol/L (ref 3.5–5.1)
Sodium: 140 mmol/L (ref 135–145)
Total Bilirubin: 0.6 mg/dL (ref 0.3–1.2)
Total Protein: 6.9 g/dL (ref 6.5–8.1)

## 2022-04-05 LAB — TYPE AND SCREEN
ABO/RH(D): A POS
Antibody Screen: NEGATIVE

## 2022-04-05 LAB — SURGICAL PCR SCREEN
MRSA, PCR: NEGATIVE
Staphylococcus aureus: NEGATIVE

## 2022-04-05 LAB — SARS CORONAVIRUS 2 BY RT PCR: SARS Coronavirus 2 by RT PCR: NEGATIVE

## 2022-04-05 NOTE — Progress Notes (Signed)
PCP - Dr. Arlina Robes [place] Cardiologist - Dr. Candee Furbish A. Fib Doctor - Dr. Steffanie Dunn  PPM/ICD - Denies Device Orders - n/a Rep Notified - n/a  Chest x-ray - 04/05/2022 EKG - 04/05/2022 Stress Test - 03/31/2022 ECHO - 11/23/2021 Cardiac Cath - Denies  Sleep Study - Denies CPAP - n/a  No DM  Last dose of GLP1 agonist- n/a GLP1 instructions: n/a  Blood Thinner Instructions: Per surgeons instructions, pt held Eliquis x3 days. Last dose was March 30th. Aspirin Instructions: n/a  NPO after midnight  COVID TEST- Yes. Result pending.   Anesthesia review: Yes. Pt has A.fib. She was supposed to have ablation earlier this year, but pre-surgery work-up showed a lung nodule on CXR. Ablation was postponed until she had recovered from this lung surgery.  Patient denies shortness of breath, fever, cough and chest pain at PAT appointment. Pt denies any respiratory illness/infection in the last two months.   All instructions explained to the patient, with a verbal understanding of the material. Patient agrees to go over the instructions while at home for a better understanding. Patient also instructed to self quarantine after being tested for COVID-19. The opportunity to ask questions was provided.

## 2022-04-05 NOTE — Progress Notes (Signed)
PT/PTT that was collected at pre-op appointment hemolyzed. Needs to be recollected DOS. Order modified to reflect this change.

## 2022-04-07 ENCOUNTER — Encounter (HOSPITAL_COMMUNITY)
Admission: RE | Disposition: A | Discharge status: Home / Self Care | Payer: Self-pay | Source: Home / Self Care | Attending: Thoracic Surgery (Cardiothoracic Vascular Surgery)

## 2022-04-07 ENCOUNTER — Inpatient Hospital Stay (HOSPITAL_COMMUNITY)
Admission: RE | Admit: 2022-04-07 | Discharge: 2022-04-08 | DRG: 163 | Disposition: A | Discharge status: Home / Self Care | Payer: Medicare PPO | Attending: Thoracic Surgery (Cardiothoracic Vascular Surgery) | Admitting: Thoracic Surgery (Cardiothoracic Vascular Surgery)

## 2022-04-07 ENCOUNTER — Encounter (HOSPITAL_COMMUNITY): Payer: Self-pay | Admitting: Thoracic Surgery (Cardiothoracic Vascular Surgery)

## 2022-04-07 ENCOUNTER — Other Ambulatory Visit: Payer: Self-pay

## 2022-04-07 ENCOUNTER — Inpatient Hospital Stay (HOSPITAL_COMMUNITY): Payer: Medicare PPO | Admitting: Anesthesiology

## 2022-04-07 ENCOUNTER — Inpatient Hospital Stay (HOSPITAL_COMMUNITY): Payer: Medicare PPO | Admitting: Physician Assistant

## 2022-04-07 ENCOUNTER — Inpatient Hospital Stay (HOSPITAL_COMMUNITY): Payer: Medicare PPO

## 2022-04-07 DIAGNOSIS — Z91041 Radiographic dye allergy status: Secondary | ICD-10-CM | POA: Diagnosis not present

## 2022-04-07 DIAGNOSIS — J850 Gangrene and necrosis of lung: Secondary | ICD-10-CM | POA: Diagnosis present

## 2022-04-07 DIAGNOSIS — R001 Bradycardia, unspecified: Secondary | ICD-10-CM | POA: Diagnosis present

## 2022-04-07 DIAGNOSIS — Z1152 Encounter for screening for COVID-19: Secondary | ICD-10-CM

## 2022-04-07 DIAGNOSIS — Z8249 Family history of ischemic heart disease and other diseases of the circulatory system: Secondary | ICD-10-CM | POA: Diagnosis not present

## 2022-04-07 DIAGNOSIS — Z79899 Other long term (current) drug therapy: Secondary | ICD-10-CM | POA: Diagnosis not present

## 2022-04-07 DIAGNOSIS — Z885 Allergy status to narcotic agent status: Secondary | ICD-10-CM | POA: Diagnosis not present

## 2022-04-07 DIAGNOSIS — M1990 Unspecified osteoarthritis, unspecified site: Secondary | ICD-10-CM | POA: Diagnosis present

## 2022-04-07 DIAGNOSIS — Z9889 Other specified postprocedural states: Secondary | ICD-10-CM

## 2022-04-07 DIAGNOSIS — Z808 Family history of malignant neoplasm of other organs or systems: Secondary | ICD-10-CM | POA: Diagnosis not present

## 2022-04-07 DIAGNOSIS — R911 Solitary pulmonary nodule: Secondary | ICD-10-CM

## 2022-04-07 DIAGNOSIS — J45909 Unspecified asthma, uncomplicated: Secondary | ICD-10-CM | POA: Diagnosis not present

## 2022-04-07 DIAGNOSIS — K449 Diaphragmatic hernia without obstruction or gangrene: Secondary | ICD-10-CM | POA: Diagnosis present

## 2022-04-07 DIAGNOSIS — Z7901 Long term (current) use of anticoagulants: Secondary | ICD-10-CM | POA: Diagnosis not present

## 2022-04-07 DIAGNOSIS — Z8261 Family history of arthritis: Secondary | ICD-10-CM | POA: Diagnosis not present

## 2022-04-07 DIAGNOSIS — Z803 Family history of malignant neoplasm of breast: Secondary | ICD-10-CM

## 2022-04-07 DIAGNOSIS — Z90710 Acquired absence of both cervix and uterus: Secondary | ICD-10-CM

## 2022-04-07 DIAGNOSIS — E785 Hyperlipidemia, unspecified: Secondary | ICD-10-CM | POA: Diagnosis present

## 2022-04-07 DIAGNOSIS — Z87891 Personal history of nicotine dependence: Secondary | ICD-10-CM | POA: Diagnosis not present

## 2022-04-07 DIAGNOSIS — Z85828 Personal history of other malignant neoplasm of skin: Secondary | ICD-10-CM

## 2022-04-07 DIAGNOSIS — I4891 Unspecified atrial fibrillation: Secondary | ICD-10-CM | POA: Diagnosis present

## 2022-04-07 DIAGNOSIS — Z8041 Family history of malignant neoplasm of ovary: Secondary | ICD-10-CM

## 2022-04-07 DIAGNOSIS — Z833 Family history of diabetes mellitus: Secondary | ICD-10-CM

## 2022-04-07 DIAGNOSIS — Z7722 Contact with and (suspected) exposure to environmental tobacco smoke (acute) (chronic): Secondary | ICD-10-CM | POA: Diagnosis present

## 2022-04-07 DIAGNOSIS — J8410 Pulmonary fibrosis, unspecified: Secondary | ICD-10-CM | POA: Diagnosis present

## 2022-04-07 DIAGNOSIS — Z8711 Personal history of peptic ulcer disease: Secondary | ICD-10-CM

## 2022-04-07 DIAGNOSIS — K219 Gastro-esophageal reflux disease without esophagitis: Secondary | ICD-10-CM | POA: Diagnosis present

## 2022-04-07 DIAGNOSIS — Z800 Family history of malignant neoplasm of digestive organs: Secondary | ICD-10-CM | POA: Diagnosis not present

## 2022-04-07 DIAGNOSIS — Z01818 Encounter for other preprocedural examination: Secondary | ICD-10-CM

## 2022-04-07 HISTORY — PX: NODE DISSECTION: SHX5269

## 2022-04-07 HISTORY — PX: LUNG SURGERY: SHX703

## 2022-04-07 LAB — ABO/RH: ABO/RH(D): A POS

## 2022-04-07 LAB — PROTIME-INR
INR: 1.1 (ref 0.8–1.2)
Prothrombin Time: 13.7 seconds (ref 11.4–15.2)

## 2022-04-07 LAB — APTT: aPTT: 30 seconds (ref 24–36)

## 2022-04-07 SURGERY — LOBECTOMY, LUNG, ROBOT-ASSISTED, USING VATS
Anesthesia: General | Site: Chest | Laterality: Left

## 2022-04-07 MED ORDER — FENTANYL CITRATE (PF) 250 MCG/5ML IJ SOLN
INTRAMUSCULAR | Status: AC
  Filled 2022-04-07: qty 5

## 2022-04-07 MED ORDER — EPHEDRINE SULFATE-NACL 50-0.9 MG/10ML-% IV SOSY
PREFILLED_SYRINGE | INTRAVENOUS | Status: DC | PRN
  Administered 2022-04-07: 10 mg via INTRAVENOUS

## 2022-04-07 MED ORDER — ONDANSETRON HCL 4 MG/2ML IJ SOLN
INTRAMUSCULAR | Status: AC
  Filled 2022-04-07: qty 2

## 2022-04-07 MED ORDER — ONDANSETRON HCL 4 MG/2ML IJ SOLN: 4.0000 mg | Freq: Four times a day (QID) | INTRAMUSCULAR | Status: DC | PRN

## 2022-04-07 MED ORDER — ACETAMINOPHEN 500 MG PO TABS
1000.0000 mg | ORAL_TABLET | Freq: Four times a day (QID) | ORAL | Status: DC
  Administered 2022-04-07 – 2022-04-08 (×2): 1000 mg via ORAL
  Filled 2022-04-07 (×2): qty 2

## 2022-04-07 MED ORDER — ENOXAPARIN SODIUM 40 MG/0.4ML IJ SOSY
40.0000 mg | PREFILLED_SYRINGE | Freq: Every day | INTRAMUSCULAR | Status: DC
  Administered 2022-04-08: 40 mg via SUBCUTANEOUS
  Filled 2022-04-07: qty 0.4

## 2022-04-07 MED ORDER — POLYVINYL ALCOHOL 1.4 % OP SOLN
1.0000 [drp] | Freq: Every morning | OPHTHALMIC | Status: DC
  Administered 2022-04-08: 1 [drp] via OPHTHALMIC
  Filled 2022-04-07: qty 15

## 2022-04-07 MED ORDER — CALCIUM CARBONATE ANTACID 500 MG PO CHEW
2.0000 | CHEWABLE_TABLET | Freq: Every day | ORAL | Status: DC
  Administered 2022-04-07 – 2022-04-08 (×2): 400 mg via ORAL
  Filled 2022-04-07 (×2): qty 2

## 2022-04-07 MED ORDER — ROCURONIUM BROMIDE 10 MG/ML (PF) SYRINGE
PREFILLED_SYRINGE | INTRAVENOUS | Status: AC
  Filled 2022-04-07: qty 10

## 2022-04-07 MED ORDER — SUGAMMADEX SODIUM 200 MG/2ML IV SOLN
INTRAVENOUS | Status: DC | PRN
  Administered 2022-04-07: 200 mg via INTRAVENOUS

## 2022-04-07 MED ORDER — CEFAZOLIN SODIUM-DEXTROSE 2-4 GM/100ML-% IV SOLN
2.0000 g | Freq: Three times a day (TID) | INTRAVENOUS | Status: AC
  Administered 2022-04-07 – 2022-04-08 (×2): 2 g via INTRAVENOUS
  Filled 2022-04-07 (×2): qty 100

## 2022-04-07 MED ORDER — LIDOCAINE 2% (20 MG/ML) 5 ML SYRINGE
INTRAMUSCULAR | Status: DC | PRN
  Administered 2022-04-07: 40 mg via INTRAVENOUS

## 2022-04-07 MED ORDER — AMISULPRIDE (ANTIEMETIC) 5 MG/2ML IV SOLN
INTRAVENOUS | Status: AC
  Filled 2022-04-07: qty 4

## 2022-04-07 MED ORDER — EPHEDRINE 5 MG/ML INJ
INTRAVENOUS | Status: AC
  Filled 2022-04-07: qty 5

## 2022-04-07 MED ORDER — LORATADINE 10 MG PO TABS
10.0000 mg | ORAL_TABLET | Freq: Every day | ORAL | Status: DC
  Administered 2022-04-08: 10 mg via ORAL
  Filled 2022-04-07: qty 1

## 2022-04-07 MED ORDER — SENNOSIDES-DOCUSATE SODIUM 8.6-50 MG PO TABS: 1.0000 | ORAL_TABLET | Freq: Every day | ORAL | Status: DC

## 2022-04-07 MED ORDER — ARTIFICIAL TEARS OPHTHALMIC OINT
1.0000 | TOPICAL_OINTMENT | Freq: Every day | OPHTHALMIC | Status: DC
  Administered 2022-04-07: 1 via OPHTHALMIC
  Filled 2022-04-07: qty 3.5

## 2022-04-07 MED ORDER — GLYCOPYRROLATE PF 0.2 MG/ML IJ SOSY
PREFILLED_SYRINGE | INTRAMUSCULAR | Status: AC
  Filled 2022-04-07: qty 1

## 2022-04-07 MED ORDER — AMISULPRIDE (ANTIEMETIC) 5 MG/2ML IV SOLN
10.0000 mg | Freq: Once | INTRAVENOUS | Status: AC | PRN
  Administered 2022-04-07: 10 mg via INTRAVENOUS

## 2022-04-07 MED ORDER — FENTANYL CITRATE (PF) 100 MCG/2ML IJ SOLN
25.0000 ug | INTRAMUSCULAR | Status: DC | PRN
  Administered 2022-04-07 (×2): 50 ug via INTRAVENOUS

## 2022-04-07 MED ORDER — GLYCOPYRROLATE PF 0.2 MG/ML IJ SOSY
PREFILLED_SYRINGE | INTRAMUSCULAR | Status: DC | PRN
  Administered 2022-04-07: .2 mg via INTRAVENOUS

## 2022-04-07 MED ORDER — FENTANYL CITRATE (PF) 100 MCG/2ML IJ SOLN
INTRAMUSCULAR | Status: AC
  Filled 2022-04-07: qty 2

## 2022-04-07 MED ORDER — MUPIROCIN 2 % EX OINT
TOPICAL_OINTMENT | CUTANEOUS | Status: AC
  Filled 2022-04-07: qty 22

## 2022-04-07 MED ORDER — ONDANSETRON HCL 4 MG/2ML IJ SOLN
INTRAMUSCULAR | Status: DC | PRN
  Administered 2022-04-07: 4 mg via INTRAVENOUS

## 2022-04-07 MED ORDER — LIDOCAINE 2% (20 MG/ML) 5 ML SYRINGE
INTRAMUSCULAR | Status: AC
  Filled 2022-04-07: qty 5

## 2022-04-07 MED ORDER — 0.9 % SODIUM CHLORIDE (POUR BTL) OPTIME
TOPICAL | Status: DC | PRN
  Administered 2022-04-07: 2000 mL

## 2022-04-07 MED ORDER — PHENYLEPHRINE 80 MCG/ML (10ML) SYRINGE FOR IV PUSH (FOR BLOOD PRESSURE SUPPORT)
PREFILLED_SYRINGE | INTRAVENOUS | Status: DC | PRN
  Administered 2022-04-07: 160 ug via INTRAVENOUS

## 2022-04-07 MED ORDER — ROCURONIUM BROMIDE 10 MG/ML (PF) SYRINGE
PREFILLED_SYRINGE | INTRAVENOUS | Status: DC | PRN
  Administered 2022-04-07: 30 mg via INTRAVENOUS
  Administered 2022-04-07: 20 mg via INTRAVENOUS
  Administered 2022-04-07: 50 mg via INTRAVENOUS

## 2022-04-07 MED ORDER — LACTATED RINGERS IV SOLN: INTRAVENOUS | Status: DC

## 2022-04-07 MED ORDER — PANTOPRAZOLE SODIUM 40 MG PO TBEC
40.0000 mg | DELAYED_RELEASE_TABLET | Freq: Every day | ORAL | Status: DC
  Administered 2022-04-08: 40 mg via ORAL
  Filled 2022-04-07: qty 1

## 2022-04-07 MED ORDER — BUPIVACAINE HCL (PF) 0.5 % IJ SOLN
INTRAMUSCULAR | Status: AC
  Filled 2022-04-07: qty 30

## 2022-04-07 MED ORDER — OXYCODONE HCL 5 MG PO TABS: 5.0000 mg | ORAL_TABLET | ORAL | Status: DC | PRN

## 2022-04-07 MED ORDER — LUNG SURGERY BOOK
Freq: Once | Status: AC
  Filled 2022-04-07: qty 1

## 2022-04-07 MED ORDER — SODIUM CHLORIDE FLUSH 0.9 % IV SOLN
INTRAVENOUS | Status: DC | PRN
  Administered 2022-04-07: 100 mL

## 2022-04-07 MED ORDER — TRAMADOL HCL 50 MG PO TABS: 50.0000 mg | ORAL_TABLET | Freq: Four times a day (QID) | ORAL | Status: DC | PRN

## 2022-04-07 MED ORDER — KETOROLAC TROMETHAMINE 15 MG/ML IJ SOLN
15.0000 mg | Freq: Four times a day (QID) | INTRAMUSCULAR | Status: DC
  Administered 2022-04-07 – 2022-04-08 (×2): 15 mg via INTRAVENOUS
  Filled 2022-04-07 (×3): qty 1

## 2022-04-07 MED ORDER — PANTOPRAZOLE SODIUM 40 MG PO TBEC: 40.0000 mg | DELAYED_RELEASE_TABLET | Freq: Every day | ORAL | Status: DC

## 2022-04-07 MED ORDER — ACETAMINOPHEN 160 MG/5ML PO SOLN: 1000.0000 mg | Freq: Four times a day (QID) | ORAL | Status: DC

## 2022-04-07 MED ORDER — LACTATED RINGERS IV SOLN: INTRAVENOUS | Status: DC | PRN

## 2022-04-07 MED ORDER — SIMVASTATIN 5 MG PO TABS
5.0000 mg | ORAL_TABLET | Freq: Every day | ORAL | Status: DC
  Administered 2022-04-08: 5 mg via ORAL
  Filled 2022-04-07 (×2): qty 1

## 2022-04-07 MED ORDER — ORAL CARE MOUTH RINSE: 15.0000 mL | Freq: Once | OROMUCOSAL | Status: AC

## 2022-04-07 MED ORDER — DEXMEDETOMIDINE HCL IN NACL 80 MCG/20ML IV SOLN
INTRAVENOUS | Status: AC
  Filled 2022-04-07: qty 20

## 2022-04-07 MED ORDER — CHLORHEXIDINE GLUCONATE 0.12 % MT SOLN
15.0000 mL | Freq: Once | OROMUCOSAL | Status: AC
  Administered 2022-04-07: 15 mL via OROMUCOSAL
  Filled 2022-04-07: qty 15

## 2022-04-07 MED ORDER — PHENYLEPHRINE 80 MCG/ML (10ML) SYRINGE FOR IV PUSH (FOR BLOOD PRESSURE SUPPORT)
PREFILLED_SYRINGE | INTRAVENOUS | Status: AC
  Filled 2022-04-07: qty 10

## 2022-04-07 MED ORDER — BUPIVACAINE LIPOSOME 1.3 % IJ SUSP
INTRAMUSCULAR | Status: AC
  Filled 2022-04-07: qty 20

## 2022-04-07 MED ORDER — DEXAMETHASONE SODIUM PHOSPHATE 10 MG/ML IJ SOLN
INTRAMUSCULAR | Status: DC | PRN
  Administered 2022-04-07: 8 mg via INTRAVENOUS

## 2022-04-07 MED ORDER — VANCOMYCIN HCL IN DEXTROSE 1-5 GM/200ML-% IV SOLN
1000.0000 mg | INTRAVENOUS | Status: AC
  Administered 2022-04-07: 1000 mg via INTRAVENOUS
  Filled 2022-04-07: qty 200

## 2022-04-07 MED ORDER — DRONEDARONE HCL 400 MG PO TABS
400.0000 mg | ORAL_TABLET | Freq: Two times a day (BID) | ORAL | Status: DC
  Administered 2022-04-08: 400 mg via ORAL
  Filled 2022-04-07 (×3): qty 1

## 2022-04-07 MED ORDER — DEXAMETHASONE SODIUM PHOSPHATE 10 MG/ML IJ SOLN
INTRAMUSCULAR | Status: AC
  Filled 2022-04-07: qty 1

## 2022-04-07 MED ORDER — ACETAMINOPHEN 500 MG PO TABS
1000.0000 mg | ORAL_TABLET | Freq: Once | ORAL | Status: AC
  Administered 2022-04-07: 1000 mg via ORAL
  Filled 2022-04-07: qty 2

## 2022-04-07 MED ORDER — PROPOFOL 10 MG/ML IV BOLUS
INTRAVENOUS | Status: DC | PRN
  Administered 2022-04-07: 30 mg via INTRAVENOUS
  Administered 2022-04-07: 100 mg via INTRAVENOUS

## 2022-04-07 MED ORDER — BISACODYL 5 MG PO TBEC
10.0000 mg | DELAYED_RELEASE_TABLET | Freq: Every day | ORAL | Status: DC
  Filled 2022-04-07: qty 2

## 2022-04-07 MED ORDER — PROPOFOL 10 MG/ML IV BOLUS
INTRAVENOUS | Status: AC
  Filled 2022-04-07: qty 20

## 2022-04-07 MED ORDER — KETAMINE HCL 10 MG/ML IJ SOLN
INTRAMUSCULAR | Status: DC | PRN
  Administered 2022-04-07: 10 mg via INTRAVENOUS

## 2022-04-07 MED ORDER — KETAMINE HCL 50 MG/5ML IJ SOSY
PREFILLED_SYRINGE | INTRAMUSCULAR | Status: AC
  Filled 2022-04-07: qty 5

## 2022-04-07 MED ORDER — FENTANYL CITRATE (PF) 100 MCG/2ML IJ SOLN
INTRAMUSCULAR | Status: DC | PRN
  Administered 2022-04-07: 100 ug via INTRAVENOUS
  Administered 2022-04-07: 50 ug via INTRAVENOUS

## 2022-04-07 SURGICAL SUPPLY — 104 items
ADH SKN CLS APL DERMABOND .7 (GAUZE/BANDAGES/DRESSINGS) ×1
APL PRP STRL LF DISP 70% ISPRP (MISCELLANEOUS) ×1
BAG SPEC RTRVL C125 8X14 (MISCELLANEOUS) ×1
BLADE CLIPPER SURG (BLADE) ×1 IMPLANT
BLADE SURG 11 STRL SS (BLADE) ×1 IMPLANT
CANISTER SUCT 3000ML PPV (MISCELLANEOUS) ×2 IMPLANT
CANNULA REDUC XI 12-8 STAPL (CANNULA) ×2
CANNULA REDUCER 12-8 DVNC XI (CANNULA) ×2 IMPLANT
CATH THORACIC 28FR (CATHETERS) ×1 IMPLANT
CHLORAPREP W/TINT 26 (MISCELLANEOUS) ×1 IMPLANT
CLIP TI MEDIUM 6 (CLIP) IMPLANT
CNTNR URN SCR LID CUP LEK RST (MISCELLANEOUS) ×5 IMPLANT
CONN ST 1/4X3/8  BEN (MISCELLANEOUS)
CONN ST 1/4X3/8 BEN (MISCELLANEOUS) IMPLANT
CONT SPEC 4OZ STRL OR WHT (MISCELLANEOUS) ×8
DEFOGGER SCOPE WARMER CLEARIFY (MISCELLANEOUS) ×1 IMPLANT
DERMABOND ADVANCED .7 DNX12 (GAUZE/BANDAGES/DRESSINGS) ×1 IMPLANT
DRAIN CHANNEL 28F RND 3/8 FF (WOUND CARE) IMPLANT
DRAPE ARM DVNC X/XI (DISPOSABLE) ×4 IMPLANT
DRAPE COLUMN DVNC XI (DISPOSABLE) ×1 IMPLANT
DRAPE CV SPLIT W-CLR ANES SCRN (DRAPES) ×1 IMPLANT
DRAPE DA VINCI XI ARM (DISPOSABLE) ×4
DRAPE DA VINCI XI COLUMN (DISPOSABLE) ×1
DRAPE ORTHO SPLIT 77X108 STRL (DRAPES) ×1
DRAPE SURG ORHT 6 SPLT 77X108 (DRAPES) ×1 IMPLANT
ELECT BLADE 6.5 EXT (BLADE) IMPLANT
ELECT REM PT RETURN 9FT ADLT (ELECTROSURGICAL) ×1
ELECTRODE REM PT RTRN 9FT ADLT (ELECTROSURGICAL) ×1 IMPLANT
GAUZE KITTNER 4X8 (MISCELLANEOUS) ×1 IMPLANT
GAUZE SPONGE 4X4 12PLY STRL (GAUZE/BANDAGES/DRESSINGS) ×1 IMPLANT
GLOVE BIO SURGEON STRL SZ7.5 (GLOVE) ×2 IMPLANT
GLOVE SURG POLYISO LF SZ8 (GLOVE) ×1 IMPLANT
GOWN STRL REUS W/ TWL LRG LVL3 (GOWN DISPOSABLE) ×2 IMPLANT
GOWN STRL REUS W/ TWL XL LVL3 (GOWN DISPOSABLE) ×2 IMPLANT
GOWN STRL REUS W/TWL 2XL LVL3 (GOWN DISPOSABLE) ×1 IMPLANT
GOWN STRL REUS W/TWL LRG LVL3 (GOWN DISPOSABLE) ×2
GOWN STRL REUS W/TWL XL LVL3 (GOWN DISPOSABLE) ×2
HEMOSTAT SURGICEL 2X14 (HEMOSTASIS) ×3 IMPLANT
IRRIGATION STRYKERFLOW (MISCELLANEOUS) IMPLANT
IRRIGATOR STRYKERFLOW (MISCELLANEOUS)
KIT BASIN OR (CUSTOM PROCEDURE TRAY) ×1 IMPLANT
KIT TURNOVER KIT B (KITS) ×1 IMPLANT
NDL 22X1.5 STRL (OR ONLY) (MISCELLANEOUS) ×1 IMPLANT
NEEDLE 22X1.5 STRL (OR ONLY) (MISCELLANEOUS) ×1 IMPLANT
NS IRRIG 1000ML POUR BTL (IV SOLUTION) ×3 IMPLANT
PACK CHEST (CUSTOM PROCEDURE TRAY) ×1 IMPLANT
PAD ARMBOARD 7.5X6 YLW CONV (MISCELLANEOUS) ×5 IMPLANT
RELOAD STAPLE 45 2.5 WHT DVNC (STAPLE) IMPLANT
RELOAD STAPLE 45 3.5 BLU DVNC (STAPLE) IMPLANT
RELOAD STAPLE 45 4.3 GRN DVNC (STAPLE) IMPLANT
RELOAD STAPLER 2.5X45 WHT DVNC (STAPLE) IMPLANT
RELOAD STAPLER 3.5X45 BLU DVNC (STAPLE) ×3 IMPLANT
RELOAD STAPLER 4.3X45 GRN DVNC (STAPLE) IMPLANT
SCISSORS LAP 5X35 DISP (ENDOMECHANICALS) IMPLANT
SEAL CANN UNIV 5-8 DVNC XI (MISCELLANEOUS) ×2 IMPLANT
SEAL XI 5MM-8MM UNIVERSAL (MISCELLANEOUS) ×2
SEALANT PROGEL (MISCELLANEOUS) IMPLANT
SEALER LIGASURE MARYLAND 30 (ELECTROSURGICAL) IMPLANT
SET TRI-LUMEN FLTR TB AIRSEAL (TUBING) ×1 IMPLANT
SOL ELECTROSURG ANTI STICK (MISCELLANEOUS)
SOLUTION ELECTROSURG ANTI STCK (MISCELLANEOUS) IMPLANT
SPONGE INTESTINAL PEANUT (DISPOSABLE) IMPLANT
SPONGE TONSIL 1 RF SGL (DISPOSABLE) IMPLANT
STAPLER 45 SUREFORM CVD (STAPLE) ×1
STAPLER 45 SUREFORM CVD DVNC (STAPLE) IMPLANT
STAPLER CANNULA SEAL DVNC XI (STAPLE) ×2 IMPLANT
STAPLER CANNULA SEAL XI (STAPLE) ×2
STAPLER RELOAD 2.5X45 WHITE (STAPLE)
STAPLER RELOAD 2.5X45 WHT DVNC (STAPLE)
STAPLER RELOAD 3.5X45 BLU DVNC (STAPLE) ×3
STAPLER RELOAD 3.5X45 BLUE (STAPLE) ×3
STAPLER RELOAD 4.3X45 GREEN (STAPLE)
STAPLER RELOAD 4.3X45 GRN DVNC (STAPLE)
STOPCOCK 4 WAY LG BORE MALE ST (IV SETS) ×1 IMPLANT
SUT MNCRL AB 3-0 PS2 18 (SUTURE) IMPLANT
SUT MON AB 2-0 CT1 36 (SUTURE) IMPLANT
SUT PDS AB 1 CTX 36 (SUTURE) IMPLANT
SUT PROLENE 4 0 RB 1 (SUTURE)
SUT PROLENE 4-0 RB1 .5 CRCL 36 (SUTURE) IMPLANT
SUT SILK  1 MH (SUTURE) ×1
SUT SILK 1 MH (SUTURE) ×1 IMPLANT
SUT SILK 1 TIES 10X30 (SUTURE) IMPLANT
SUT SILK 2 0 SH (SUTURE) IMPLANT
SUT SILK 2 0SH CR/8 30 (SUTURE) IMPLANT
SUT VIC AB 1 CTX 36 (SUTURE)
SUT VIC AB 1 CTX36XBRD ANBCTR (SUTURE) IMPLANT
SUT VIC AB 2-0 CT1 27 (SUTURE) ×1
SUT VIC AB 2-0 CT1 TAPERPNT 27 (SUTURE) ×1 IMPLANT
SUT VIC AB 3-0 SH 27 (SUTURE) ×2
SUT VIC AB 3-0 SH 27X BRD (SUTURE) ×2 IMPLANT
SUT VICRYL 0 TIES 12 18 (SUTURE) ×1 IMPLANT
SUT VICRYL 0 UR6 27IN ABS (SUTURE) ×2 IMPLANT
SUT VICRYL 2 TP 1 (SUTURE) IMPLANT
SYR 10ML LL (SYRINGE) ×1 IMPLANT
SYR 20ML LL LF (SYRINGE) ×1 IMPLANT
SYR 50ML LL SCALE MARK (SYRINGE) ×1 IMPLANT
SYSTEM RETRIEVAL ANCHOR 8 (MISCELLANEOUS) IMPLANT
SYSTEM SAHARA CHEST DRAIN ATS (WOUND CARE) ×1 IMPLANT
TAPE CLOTH 4X10 WHT NS (GAUZE/BANDAGES/DRESSINGS) ×1 IMPLANT
TAPE CLOTH SURG 4X10 WHT LF (GAUZE/BANDAGES/DRESSINGS) IMPLANT
TIP APPLICATOR SPRAY EXTEND 16 (VASCULAR PRODUCTS) IMPLANT
TOWEL GREEN STERILE (TOWEL DISPOSABLE) ×1 IMPLANT
TRAY FOLEY MTR SLVR 16FR STAT (SET/KITS/TRAYS/PACK) ×1 IMPLANT
TUBING EXTENTION W/L.L. (IV SETS) ×1 IMPLANT

## 2022-04-07 NOTE — Hospital Course (Addendum)
History of Present Illness:  Brittany Harper is a 77 yo female who was incidentally found to have a 1.2 cm left upper lobe pulmonary nodule.  This was found while patient was undergoing workup for possible ablation for history of Atrial Fibrillation.  She was referred to Dr. Valeta Harms who recommended PET CT scan which showed the nodule to be hypermetabolic with an SUV max of 6.87 concerning for malignancy.  Due to this she was referred to Cardiothoracic surgery for surgical resection.  She was evaluated by Dr. Kipp Brood at which time she was offered navigational biopsy to obtain tissue diagnosis.  However patient wanted to nodule removed regardless of results.  She was offered Robotic Assisted Video Thoracoscopy.  The risks and benefits of the procedure were explained to the patient and she was agreeable to proceed.  Hospital Course:  [name] presented to [facility] on 04/07/2022.  She was taken to the operating room and underwent Left Robotic Assisted Video Thoracoscopy with wedge resection left upper lobe and lymph node dissection.  She tolerated the procedure without difficulty, was extubated, and taken to the PACU in stable condition.Chest tube with minimal output. Chest tube was to water seal and there was no air leak. CXR showed unchanged trace left apical pneumothorax. Chest tube was removed on 04/04. Same day chest x ray showed no pneumothorax. Patient is ambulating on room air with good oxygenation. All wounds are clean, dry, healing without signs of infection. She seems to have a reaction to adhesive from tape (skin redness). She is tolerating a diet. Patient wants to go home today and per Dr. Kipp Brood, ok to discharge.

## 2022-04-07 NOTE — Op Note (Signed)
      [address]       [place], [address]             [phone]        04/07/2022  Patient:  Brittany Harper Pre-Op Dx: Left upper lobe pulmonary nodule   Post-op Dx:  same Procedure: - Robotic assisted left video thoracoscopy - left upper lobe wedge resection - Mediastinal lymph node sampling - Intercostal nerve block  Surgeon and Role:      * Khamari Sheehan, Lucile Crater, MD - Primary  Assistant: Leretha Pol, PA-C  An experienced assistant was required given the complexity of this surgery and the standard of surgical care. The assistant was needed for exposure, dissection, suctioning, retraction of delicate tissues and sutures, instrument exchange and for overall help during this procedure.    Anesthesia  general EBL:  50 ml Blood Administration: none Specimen:  left upper lobe wedge resection, hilar and mediastinal nodes  Drains: 28 F argyle chest tube in left chest Counts: correct   Indications: 77 year old female with a 1.5 cm left upper lobe pulmonary nodule. There is increased avidity on PET/CT. She has excellent pulmonary function testing. We discussed several options for diagnosis and treatment. The nodule is within the fissure however there is very close proximity to the pulmonary artery. I think that this will be a very difficult wedge resection, and offered her the possibility of undergoing a navigational bronchoscopy with biopsy prior to surgery. She stated that regardless of the results she would like this to removed. I further explained that the only way to remove this safely would be via lobectomy, and she is comfortable with proceeding with that plan even if this is not a cancer.   Findings: Inflammatory response along the fissure.  The pulmonary artery was mobilized, and we were able to safely wedge out the pulmonary nodule.  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    Operative Technique: After the risks, benefits and alternatives were  thoroughly discussed, the patient was brought to the operative theatre.  Anesthesia was induced, and the patient was then placed in a lateral decubitus position and was prepped and draped in normal sterile fashion.  An appropriate surgical pause was performed, and pre-operative antibiotics were dosed accordingly.  We began by placing our 4 robotic ports in the the 7th intercostal space targeting the hilum of the lung.  A 45mm assistant port was placed in the 9th intercostal space in the anterior axillary line.  The robot was then docked and all instruments were passed under direct visualization.    The lung was then retracted superiorly, and the inferior pulmonary ligament was divided.  The hilum was mobilized anteriorly and posteriorly.  We mobilized the fissure to expose the pulmonary artery.  The nodule was evident with palpation close to the lingular branch of the PA.  With further mobilization, we were able to safely perform a wedge resection.  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    Lymph nodes were then sampled at hilum and mediastinum.  The chest was irrigated, and an air leak test was performed.  An intercostal nerve block was performed under direct visualization.  A 28 F chest tube was then placed, and we watch the lung re-expand.  The skin and soft tissue were closed with absorbable suture    The patient tolerated the procedure without any immediate complications, and was transferred to the PACU in stable condition.  Pamila Mendibles Bary Leriche

## 2022-04-07 NOTE — Transfer of Care (Signed)
Immediate Anesthesia Transfer of Care Note  Patient: Brittany Harper  Procedure(s) Performed: XI ROBOTIC ASSISTED THORACOSCOPY (Left: Chest) NODE DISSECTION (Left: Chest)  Patient Location: PACU  Anesthesia Type:General  Level of Consciousness: lethargic and responds to stimulation  Airway & Oxygen Therapy: Patient Spontanous Breathing and Patient connected to nasal cannula oxygen  Post-op Assessment: Report given to RN  Post vital signs: Reviewed and stable  Last Vitals:  Vitals Value Taken Time  BP 98/68 04/07/22 1519  Temp    Pulse 75 04/07/22 1524  Resp 17 04/07/22 1524  SpO2 100 % 04/07/22 1524  Vitals shown include unvalidated device data.  Last Pain:  Vitals:   04/07/22 1058  TempSrc:   PainSc: 0-No pain         Complications: No notable events documented.

## 2022-04-07 NOTE — Discharge Summary (Signed)
[address]       [place] [address]             [phone]    Physician Discharge Summary  Patient ID: Brittany Harper MRN: [id number] DOB/AGE: [birth year] 77 y.o.  Admit date: 04/07/2022 Discharge date: 04/08/2022  Admission Diagnoses:  Patient Active Problem List   Diagnosis Date Noted   S/P robot-assisted surgical procedure 04/07/2022   Lung nodule 03/26/2022   Hospital discharge follow-up 01/13/2021   Elevated troponin 12/28/2020   Atrial fibrillation with RVR 12/27/2020   Osteopenia 04/04/2020   History of fundoplication    Gastroesophageal reflux disease with esophagitis    Rosacea 04/01/2017   Routine general medical examination at a health care facility 12/15/2015   Hyperlipemia 12/13/2014   Hx of basal cell carcinoma 12/13/2014   Postmenopausal 12/13/2014   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     Discharge Diagnoses:  Patient Active Problem List   Diagnosis Date Noted   S/P robot-assisted surgical procedure 04/07/2022   Lung nodule 03/26/2022   Hospital discharge follow-up 01/13/2021   Elevated troponin 12/28/2020   Atrial fibrillation with RVR 12/27/2020   Osteopenia 04/04/2020   History of fundoplication    Gastroesophageal reflux disease with esophagitis    Rosacea 04/01/2017   Routine general medical examination at a health care facility 12/15/2015   Hyperlipemia 12/13/2014   Hx of basal cell carcinoma 12/13/2014   Postmenopausal 12/13/2014   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     Discharged Condition: stable  History of Present Illness:  Brittany Harper is a 77 yo female who was incidentally found to have a 1.2 cm left upper lobe pulmonary nodule.  This was found while patient was undergoing workup for possible ablation for history of Atrial Fibrillation.  She was referred to Dr. Valeta Harms who recommended PET CT scan which showed the nodule to be hypermetabolic with an  SUV max of 6.87 concerning for malignancy.  Due to this she was referred to Cardiothoracic surgery for surgical resection.  She was evaluated by Dr. Kipp Brood at which time she was offered navigational biopsy to obtain tissue diagnosis.  However patient wanted to nodule removed regardless of results.  She was offered Robotic Assisted Video Thoracoscopy.  The risks and benefits of the procedure were explained to the patient and she was agreeable to proceed.  Hospital Course:  Brittany Harper presented to [facility] on 04/07/2022.  She was taken to the operating room and underwent Left Robotic Assisted Video Thoracoscopy with wedge resection left upper lobe and lymph node dissection.  She tolerated the procedure without difficulty, was extubated, and taken to the PACU in stable condition.Chest tube with minimal output. Chest tube was to water seal and there was no air leak. CXR showed unchanged trace left apical pneumothorax. Chest tube was removed on 04/04. Same day chest x ray showed no pneumothorax. Patient is ambulating on room air with good oxygenation. All wounds are clean, dry, healing without signs of infection. She seems to have a reaction to adhesive from tape (skin redness). She is tolerating a diet. Patient wants to go home today and per Dr. Kipp Brood, ok to discharge.  Consults: None  Significant Diagnostic Studies:   Narrative & Impression  CLINICAL DATA:  [id number] Pneumothorax [id number]   EXAM: PORTABLE CHEST - 1 VIEW SAME DAY   COMPARISON:  04/08/2022, 5:36 a.m.   FINDINGS: Cardiac silhouette is unremarkable. No pneumothorax  or pleural effusion. The lungs are clear.   Left-sided chest tube has been removed. Postop changes left hemithorax.   IMPRESSION: No acute cardiopulmonary process. No pneumothorax identified status post chest tube removal     Electronically Signed   By: Sammie Bench M.D.   On: 04/08/2022 12:45   Narrative & Impression  CLINICAL DATA:  Post  LEFT-sided robotic assisted thoracoscopy and node dissection   EXAM: PORTABLE CHEST 1 VIEW   COMPARISON:  Portable exam 1549 hours compared to 04/05/2022   FINDINGS: LEFT thoracostomy tube identified.   Normal heart size, mediastinal contours, and pulmonary vascularity.   Small hiatal hernia as noted on a prior CT.   LEFT upper lobe density with adjacent surgical clips.   No acute infiltrate or pleural effusion.   Small LEFT apex pneumothorax.   IMPRESSION: Small LEFT apex pneumothorax despite thoracostomy tube.     Electronically Signed   By: Lavonia Dana M.D.   On: 04/07/2022 16:00    Treatments: surgery:   Robotic assisted left video thoracoscopy - left upper lobe wedge resection - Mediastinal lymph node sampling - Intercostal nerve block by Dr. Kipp Brood on 04/07/2022.  Discharge Exam: Blood pressure 131/60, pulse 66, temperature 98.3 F (36.8 C), temperature source Oral, resp. rate 18, height 5' 3.5" (1.613 m), weight 69.4 kg, SpO2 95 %. Cardiovascular: RRR Pulmonary: Clear to auscultation bilaterally Abdomen: Soft, non tender, bowel sounds present. Extremities: SCDs in place Wounds: Clean and dry.  No erythema or signs of infection.  Discharge Medications:  Allergies as of 04/08/2022       Reactions   Ivp Dye [iodinated Contrast Media] Shortness Of Breath   Feels hot   Augmentin [amoxicillin-pot Clavulanate] Nausea And Vomiting   Did it involve swelling of the face/tongue/throat, SOB, or low BP? No Did it involve sudden or severe rash/hives, skin peeling, or any reaction on the inside of your mouth or nose? No Did you need to seek medical attention at a hospital or doctor's office? No When did it last happen?      20+ years If all above answers are "NO", may proceed with cephalosporin use.   Demerol [meperidine] Nausea And Vomiting   Monosodium Glutamate Other (See Comments)   "Heart racing and hot feeling."   Morphine And Related Nausea And Vomiting    Shellfish Allergy         Medication List     TAKE these medications    AMBULATORY NON FORMULARY MEDICATION Apply 1 application topically daily. Hempvanna lotion   Calcium 1200 1200-1000 MG-UNIT Chew Chew 2 tablets by mouth daily. Slow release   CENTRUM SILVER ULTRA WOMENS PO Take 1 tablet by mouth daily at 12 noon. 50+   Eliquis 5 MG Tabs tablet Generic drug: apixaban Take 1 tablet (5 mg total) by mouth 2 (two) times daily.   fexofenadine 180 MG tablet Commonly known as: ALLEGRA Take 180 mg by mouth daily.   metroNIDAZOLE 0.75 % cream Commonly known as: METROCREAM Apply 1 application on the skin twice a day   Multaq 400 MG tablet Generic drug: dronedarone Take 1 tablet (400 mg total) by mouth 2 (two) times daily with a meal.   omeprazole 40 MG capsule Commonly known as: PRILOSEC Take 1 capsule (40 mg total) by mouth daily.   OSTEO BI-FLEX ADV JOINT SHIELD PO Take 2 tablets by mouth daily with lunch.   simvastatin 5 MG tablet Commonly known as: ZOCOR Take 1 tablet (5 mg total) by mouth daily.  Systane 0.4-0.3 % Soln Generic drug: Polyethyl Glycol-Propyl Glycol Place 1 drop into both eyes every morning.   Systane Overnight Therapy 0.3 % Gel ophthalmic ointment Generic drug: hypromellose Place 1 application into both eyes at bedtime.   traMADol 50 MG tablet Commonly known as: ULTRAM Take 1 tablet (50 mg total) by mouth every 6 (six) hours as needed for moderate pain.        Follow-up Information     Lajuana Matte, MD. Go on 04/16/2022.   Specialty: Cardiothoracic Surgery Why: Appointment time is at 9:00 am Contact information: [address] [place] [address] [phone]                 Signed:  Arnoldo Lenis 04/08/2022, 3:24 PM

## 2022-04-07 NOTE — Interval H&P Note (Signed)
History and Physical Interval Note:  04/07/2022 12:04 PM  Brittany Harper  has presented today for surgery, with the diagnosis of LEFT LUNG NODULE.  The various methods of treatment have been discussed with the patient and family. After consideration of risks, benefits and other options for treatment, the patient has consented to  Procedure(s): XI ROBOTIC ASSISTED THORACOSCOPY-LEFT UPPER LOBECTOMY (Left) as a surgical intervention.  [name] history has been reviewed, patient examined, no change in status, stable for surgery.  I have reviewed the patient's chart and labs.  Questions were answered to the patient's satisfaction.     Brittany Harper

## 2022-04-07 NOTE — Anesthesia Preprocedure Evaluation (Signed)
Anesthesia Evaluation  Patient identified by MRN, date of birth, ID band Patient awake    Reviewed: Allergy & Precautions, NPO status , Patient's Chart, lab work & pertinent test results  Airway Mallampati: III  TM Distance: >3 FB Neck ROM: Full  Mouth opening: Limited Mouth Opening  Dental  (+) Dental Advisory Given   Pulmonary asthma    breath sounds clear to auscultation       Cardiovascular + dysrhythmias Atrial Fibrillation  Rhythm:Regular Rate:Normal     Neuro/Psych negative neurological ROS     GI/Hepatic Neg liver ROS, hiatal hernia, PUD,GERD  ,,  Endo/Other  negative endocrine ROS    Renal/GU negative Renal ROS     Musculoskeletal  (+) Arthritis ,    Abdominal   Peds  Hematology negative hematology ROS (+)   Anesthesia Other Findings   Reproductive/Obstetrics                             Anesthesia Physical Anesthesia Plan  ASA: 3  Anesthesia Plan: General   Post-op Pain Management: Tylenol PO (pre-op)* and Ketamine IV*   Induction: Intravenous  PONV Risk Score and Plan: 3 and Dexamethasone, Ondansetron and Treatment may vary due to age or medical condition  Airway Management Planned: Double Lumen EBT  Additional Equipment: ClearSight  Intra-op Plan:   Post-operative Plan: Extubation in OR  Informed Consent: I have reviewed the patients History and Physical, chart, labs and discussed the procedure including the risks, benefits and alternatives for the proposed anesthesia with the patient or authorized representative who has indicated his/her understanding and acceptance.     Dental advisory given  Plan Discussed with: CRNA  Anesthesia Plan Comments:        Anesthesia Quick Evaluation

## 2022-04-07 NOTE — Anesthesia Procedure Notes (Signed)
Procedure Name: MAC Date/Time: 04/07/2022 12:40 PM  Performed by: Barrington Ellison, CRNAPre-anesthesia Checklist: Patient identified, Emergency Drugs available, Patient being monitored and Suction available Patient Re-evaluated:Patient Re-evaluated prior to induction Oxygen Delivery Method: Circle system utilized Preoxygenation: Pre-oxygenation with 100% oxygen Induction Type: IV induction Ventilation: Mask ventilation without difficulty and Oral airway inserted - appropriate to patient size Laryngoscope Size: Mac and 3 Grade View: Grade II Endobronchial tube: Left, EBT position confirmed by auscultation and EBT position confirmed by fiberoptic bronchoscope and 37 Fr Number of attempts: 1 Airway Equipment and Method: Stylet Placement Confirmation: ETT inserted through vocal cords under direct vision, positive ETCO2 and breath sounds checked- equal and bilateral Secured at: 30 cm Tube secured with: Tape Dental Injury: Teeth and Oropharynx as per pre-operative assessment

## 2022-04-07 NOTE — Plan of Care (Signed)
  Problem: Education: Goal: Knowledge of disease or condition will improve Outcome: Progressing Goal: Knowledge of the prescribed therapeutic regimen will improve Outcome: Progressing   Problem: Activity: Goal: Risk for activity intolerance will decrease Outcome: Progressing   Problem: Clinical Measurements: Goal: Postoperative complications will be avoided or minimized Outcome: Progressing   Problem: Pain Management: Goal: Pain level will decrease Outcome: Progressing   Problem: Skin Integrity: Goal: Wound healing without signs and symptoms infection will improve Outcome: Progressing   Problem: Education: Goal: Knowledge of General Education information will improve Description: Including pain rating scale, medication(s)/side effects and non-pharmacologic comfort measures Outcome: Progressing   Problem: Health Behavior/Discharge Planning: Goal: Ability to manage health-related needs will improve Outcome: Progressing   Problem: Clinical Measurements: Goal: Ability to maintain clinical measurements within normal limits will improve Outcome: Progressing Goal: Will remain free from infection Outcome: Progressing Goal: Diagnostic test results will improve Outcome: Progressing Goal: Respiratory complications will improve Outcome: Progressing Goal: Cardiovascular complication will be avoided Outcome: Progressing   Problem: Activity: Goal: Risk for activity intolerance will decrease Outcome: Progressing   Problem: Nutrition: Goal: Adequate nutrition will be maintained Outcome: Progressing   Problem: Coping: Goal: Level of anxiety will decrease Outcome: Progressing   Problem: Elimination: Goal: Will not experience complications related to bowel motility Outcome: Progressing Goal: Will not experience complications related to urinary retention Outcome: Progressing   Problem: Pain Managment: Goal: General experience of comfort will improve Outcome: Progressing    Problem: Skin Integrity: Goal: Risk for impaired skin integrity will decrease Outcome: Progressing

## 2022-04-07 NOTE — Brief Op Note (Signed)
04/07/2022  3:02 PM  PATIENT:  Brittany Harper  77 y.o. female  PRE-OPERATIVE DIAGNOSIS:  LEFT LUNG NODULE  POST-OPERATIVE DIAGNOSIS:  LEFT LUNG NODULE  PROCEDURE:  Procedure(s):  XI ROBOTIC ASSISTED THORACOSCOPY (Left) -Wedge Resection Left Upper Lobe Nodule  NODE DISSECTION (Left)  SURGEON:  Surgeon(s) and Role:    * Lightfoot, Lucile Crater, MD - Primary  PHYSICIAN ASSISTANT: Reagan Behlke PA-C   ASSISTANTS: none   ANESTHESIA:   general  EBL:  30 mL   BLOOD ADMINISTERED:none  DRAINS:  19 Blake Drain Left Chest    LOCAL MEDICATIONS USED:  BUPIVICAINE   SPECIMEN:  Source of Specimen:  Left Upper Lobe Wedge, Lymph Nodes  DISPOSITION OF SPECIMEN:  PATHOLOGY  COUNTS:  YES  TOURNIQUET:  * No tourniquets in log *  DICTATION: .Dragon Dictation  PLAN OF CARE: Admit to inpatient   PATIENT DISPOSITION:  PACU - hemodynamically stable.   Delay start of Pharmacological VTE agent (>24hrs) due to surgical blood loss or risk of bleeding: no

## 2022-04-07 NOTE — Discharge Instructions (Signed)
obot-Assisted Thoracic Surgery, Care After The following information offers guidance on how to care for yourself after your procedure. Your health care provider may also give you more specific instructions. If you have problems or questions, contact your health care provider. What can I expect after the procedure? After the procedure, it is common to have: Some pain and aches in the area of your surgical incisions. Pain when breathing in (inhaling) and coughing. Tiredness (fatigue). Trouble sleeping. Constipation. Follow these instructions at home: Medicines Take over-the-counter and prescription medicines only as told by your health care provider. If you were prescribed an antibiotic medicine, take it as told by your health care provider. Do not stop taking the antibiotic even if you start to feel better. Talk with your health care provider about safe and effective ways to manage pain after your procedure. Pain management should fit your specific health needs. Take pain medicine before pain becomes severe. Relieving and controlling your pain will make breathing easier for you. Ask your health care provider if the medicine prescribed to you requires you to avoid driving or using machinery. Eating and drinking Follow instructions from your health care provider about eating or drinking restrictions. These will vary depending on what procedure you had. Your health care provider may recommend: A liquid diet or soft diet for the first few days. Meals that are smaller and more frequent. A diet of fruits, vegetables, whole grains, and low-fat proteins. Limiting foods that are high in fat and processed sugar, including fried or sweet foods. Incision care Follow instructions from your health care provider about how to take care of your incisions. Make sure you: Wash your hands with soap and water for at least 20 seconds before and after you change your bandage (dressing). If soap and water are not  available, use hand sanitizer. Change your dressing as told by your health care provider. Leave stitches (sutures), skin glue, or adhesive strips in place. These skin closures may need to stay in place for 2 weeks or longer. If adhesive strip edges start to loosen and curl up, you may trim the loose edges. Do not remove adhesive strips completely unless your health care provider tells you to do that. Check your incision area every day for signs of infection. Check for: Redness, swelling, or more pain. Fluid or blood. Warmth. Pus or a bad smell. Activity Return to your normal activities as told by your health care provider. Ask your health care provider what activities are safe for you. Ask your health care provider when it is safe for you to drive. Do not lift anything that is heavier than 10 lb (4.5 kg), or the limit that you are told, until your health care provider says that it is safe. Rest as told by your health care provider. Avoid sitting for a long time without moving. Get up to take short walks every 1-2 hours. This is important to improve blood flow and breathing. Ask for help if you feel weak or unsteady. Do exercises as told by your health care provider. Pneumonia prevention  Do deep breathing exercises and cough regularly as directed. This helps clear mucus and opens your lungs. Doing this helps prevent lung infection (pneumonia). If you were given an incentive spirometer, use it as told. An incentive spirometer is a tool that measures how well you are filling your lungs with each breath. Coughing may hurt less if you try to support your chest. This is called splinting. Try one of these when you   cough: Hold a pillow against your chest. Place the palms of both hands on top of your incision area. Do not use any products that contain nicotine or tobacco. These products include cigarettes, chewing tobacco, and vaping devices, such as e-cigarettes. If you need help quitting, ask your  health care provider. Avoid secondhand smoke. General instructions If you have a drainage tube: Follow instructions from your health care provider about how to take care of it. Do not travel by airplane after your tube is removed until your health care provider tells you it is safe. You may need to take these actions to prevent or treat constipation: Drink enough fluid to keep your urine pale yellow. Take over-the-counter or prescription medicines. Eat foods that are high in fiber, such as beans, whole grains, and fresh fruits and vegetables. Limit foods that are high in fat and processed sugars, such as fried or sweet foods. Keep all follow-up visits. This is important. Contact a health care provider if: You have redness, swelling, or more pain around an incision. You have fluid or blood coming from an incision. An incision feels warm to the touch. You have pus or a bad smell coming from an incision. You have a fever. You cannot eat or drink without vomiting. Your pain medicine is not controlling your pain. Get help right away if: You have chest pain. Your heart is beating quickly. You have trouble breathing. You have trouble speaking. You are confused. You feel weak or dizzy, or you faint. These symptoms may represent a serious problem that is an emergency. Do not wait to see if the symptoms will go away. Get medical help right away. Call your local emergency services (911 in the [place] Do not drive yourself to the hospital. Summary Talk with your health care provider about safe and effective ways to manage pain after your procedure. Pain management should fit your specific health needs. Return to your normal activities as told by your health care provider. Ask your health care provider what activities are safe for you. Do deep breathing exercises and cough regularly as directed. This helps to clear mucus and prevent pneumonia. If it hurts to cough, ease pain by holding a pillow  against your chest or by placing the palms of both hands over your incisions. This information is not intended to replace advice given to you by your health care provider. Make sure you discuss any questions you have with your health care provider. Document Revised: 09/14/2019 Document Reviewed: 09/14/2019 Elsevier Patient Education  2023 Elsevier Inc. 

## 2022-04-08 ENCOUNTER — Inpatient Hospital Stay (HOSPITAL_COMMUNITY): Payer: Medicare PPO

## 2022-04-08 ENCOUNTER — Ambulatory Visit (HOSPITAL_BASED_OUTPATIENT_CLINIC_OR_DEPARTMENT_OTHER): Payer: Medicare PPO | Admitting: Nurse Practitioner

## 2022-04-08 ENCOUNTER — Encounter (HOSPITAL_COMMUNITY): Payer: Self-pay | Admitting: Thoracic Surgery (Cardiothoracic Vascular Surgery)

## 2022-04-08 ENCOUNTER — Ambulatory Visit (HOSPITAL_BASED_OUTPATIENT_CLINIC_OR_DEPARTMENT_OTHER): Payer: Medicare PPO | Admitting: Family Medicine

## 2022-04-08 ENCOUNTER — Other Ambulatory Visit (HOSPITAL_BASED_OUTPATIENT_CLINIC_OR_DEPARTMENT_OTHER): Payer: Self-pay

## 2022-04-08 LAB — CBC
HCT: 38.8 % (ref 36.0–46.0)
Hemoglobin: 13.5 g/dL (ref 12.0–15.0)
MCH: 30.8 pg (ref 26.0–34.0)
MCHC: 34.8 g/dL (ref 30.0–36.0)
MCV: 88.4 fL (ref 80.0–100.0)
Platelets: 206 10*3/uL (ref 150–400)
RBC: 4.39 MIL/uL (ref 3.87–5.11)
RDW: 12.5 % (ref 11.5–15.5)
WBC: 11.4 10*3/uL — ABNORMAL HIGH (ref 4.0–10.5)
nRBC: 0 % (ref 0.0–0.2)

## 2022-04-08 LAB — BASIC METABOLIC PANEL
Anion gap: 13 (ref 5–15)
BUN: 11 mg/dL (ref 8–23)
CO2: 25 mmol/L (ref 22–32)
Calcium: 8.7 mg/dL — ABNORMAL LOW (ref 8.9–10.3)
Chloride: 98 mmol/L (ref 98–111)
Creatinine, Ser: 0.84 mg/dL (ref 0.44–1.00)
GFR, Estimated: >60 mL/min (ref >60)
Glucose, Bld: 149 mg/dL — ABNORMAL HIGH (ref 70–99)
Potassium: 3.7 mmol/L (ref 3.5–5.1)
Sodium: 136 mmol/L (ref 135–145)

## 2022-04-08 MED ORDER — POTASSIUM CHLORIDE CRYS ER 10 MEQ PO TBCR
30.0000 meq | EXTENDED_RELEASE_TABLET | Freq: Once | ORAL | Status: AC
  Administered 2022-04-08: 30 meq via ORAL
  Filled 2022-04-08: qty 3

## 2022-04-08 MED ORDER — TRAMADOL HCL 50 MG PO TABS
50.0000 mg | ORAL_TABLET | Freq: Four times a day (QID) | ORAL | 0 refills | Status: DC | PRN
  Filled 2022-04-08: qty 28, 7d supply, fill #0

## 2022-04-08 NOTE — Progress Notes (Addendum)
      [address]       [place] [address]             [phone]       1 Day Post-Op Procedure(s) (LRB): XI ROBOTIC ASSISTED THORACOSCOPY (Left) NODE DISSECTION (Left)  Subjective: She did have nausea yesterday but resolved and eating breakfast this am. She has "no pain"  Objective: Vital signs in last 24 hours: Temp:  [97 F (36.1 C)-98.3 F (36.8 C)] 98.3 F (36.8 C) (04/04 0355) Pulse Rate:  [56-83] 56 (04/04 0355) Cardiac Rhythm: Normal sinus rhythm (04/03 1942) Resp:  [8-21] 14 (04/04 0355) BP: (98-153)/(56-75) 112/56 (04/04 0355) SpO2:  [93 %-100 %] 96 % (04/04 0355) Weight:  [69.4 kg] 69.4 kg (04/03 1041)     Intake/Output from previous day: 04/03 0701 - 04/04 0700 In: 1800 [I.V.:1800] Out: 344 [Urine:200; Blood:30; Chest Tube:114]   Physical Exam:  Cardiovascular: RRR Pulmonary: Clear to auscultation bilaterally Abdomen: Soft, non tender, bowel sounds present. Extremities: SCDs in place Wounds: Clean and dry.  No erythema or signs of infection. Chest Tube: to water seal, no air leak  Lab Results: CBC: Recent Labs    04/05/22 1319 04/08/22 0009  WBC 7.8 11.4*  HGB 14.4 13.5  HCT 44.1 38.8  PLT 260 206   BMET:  Recent Labs    04/05/22 1319 04/08/22 0009  NA 140 136  K 4.4 3.7  CL 100 98  CO2 28 25  GLUCOSE 119* 149*  BUN 17 11  CREATININE 1.05* 0.84  CALCIUM 9.4 8.7*    PT/INR:  Recent Labs    04/07/22 1100  LABPROT 13.7  INR 1.1   ABG:  INR: Will add last result for INR, ABG once components are confirmed Will add last 4 CBG results once components are confirmed  Assessment/Plan:  1. CV - SR. She has a history of a fib. On Multaq. Will restart Apixaban in am. 2.  Pulmonary - On room air. Chest tube with 114 cc since surgery. Chest tube is to water seal, no air leak. CXR this am appear stable (small left apical pneumothorax). Remove chest tube. Encourage incentive spirometer. Await final pathology. 3.  Supplement potassium 4. On Lovenox for DVT prophylaxis 5. Likely discharge in am  Donielle M ZimmermanPA-C 04/08/2022,6:59 AM  Agree with above Doing well Will remove chest tube  Charlye Spare O Shakendra Griffeth

## 2022-04-08 NOTE — Anesthesia Postprocedure Evaluation (Signed)
Anesthesia Post Note  Patient: Brittany Harper  Procedure(s) Performed: XI ROBOTIC ASSISTED THORACOSCOPY (Left: Chest) NODE DISSECTION (Left: Chest)     Patient location during evaluation: PACU Anesthesia Type: General Level of consciousness: awake and alert Pain management: pain level controlled Vital Signs Assessment: post-procedure vital signs reviewed and stable Respiratory status: spontaneous breathing, nonlabored ventilation, respiratory function stable and patient connected to nasal cannula oxygen Cardiovascular status: blood pressure returned to baseline and stable Postop Assessment: no apparent nausea or vomiting Anesthetic complications: no   No notable events documented.  Last Vitals:  Vitals:   04/08/22 0355 04/08/22 0719  BP: (!) 112/56 108/86  Pulse: (!) 56 75  Resp: 14 15  Temp: 36.8 C 36.8 C  SpO2: 96% 93%    Last Pain:  Vitals:   04/08/22 0719  TempSrc: Oral  PainSc:                  [name]

## 2022-04-08 NOTE — Progress Notes (Signed)
2 IV's removed. Discharge instructions given to patient and all questions answered. Patient discharged to [facility] via wheelchair to private vehicle.

## 2022-04-09 ENCOUNTER — Telehealth: Payer: Self-pay

## 2022-04-09 LAB — ACID FAST SMEAR (AFB, MYCOBACTERIA): Acid Fast Smear: NEGATIVE

## 2022-04-09 NOTE — Transitions of Care (Post Inpatient/ED Visit) (Signed)
   04/09/2022  Name: Chrystin Jezierski MRN: [id number] DOB: 10/12/1945  Today's TOC FU Call Status: Today's TOC FU Call Status:: Unsuccessul Call (1st Attempt) Unsuccessful Call (1st Attempt) Date: 04/09/22  Attempted to reach the patient regarding the most recent Inpatient/ED visit.  Follow Up Plan: Additional outreach attempts will be made to reach the patient to complete the Transitions of Care (Post Inpatient/ED visit) call.     Antionette Fairy, RN,BSN,CCM [facility] Care Management Care Management Community Coordinator Direct Phone: [phone] Toll Free: [phone] Fax: [phone]

## 2022-04-12 ENCOUNTER — Other Ambulatory Visit (HOSPITAL_BASED_OUTPATIENT_CLINIC_OR_DEPARTMENT_OTHER): Payer: Self-pay

## 2022-04-12 ENCOUNTER — Telehealth: Payer: Self-pay

## 2022-04-12 LAB — AEROBIC/ANAEROBIC CULTURE W GRAM STAIN (SURGICAL/DEEP WOUND)

## 2022-04-12 LAB — SURGICAL PATHOLOGY

## 2022-04-12 NOTE — Transitions of Care (Post Inpatient/ED Visit) (Signed)
   04/12/2022  Name: Brittany Harper MRN: [id number] DOB: 1945/11/25  Today's TOC FU Call Status: Today's TOC FU Call Status:: Unsuccessful Call (2nd Attempt) Unsuccessful Call (2nd Attempt) Date: 04/12/22  Attempted to reach the patient regarding the most recent Inpatient/ED visit.  Follow Up Plan: Additional outreach attempts will be made to reach the patient to complete the Transitions of Care (Post Inpatient/ED visit) call.   Antionette Fairy, RN,BSN,CCM [facility] Care Management Care Management Community Coordinator Direct Phone: [phone] Toll Free: [phone] Fax: [phone]

## 2022-04-12 NOTE — Transitions of Care (Post Inpatient/ED Visit) (Signed)
   04/12/2022  Name: Brittany Harper MRN: [id number] DOB: April 12, 1945  Today's TOC FU Call Status: Today's TOC FU Call Status:: Successful TOC FU Call Competed TOC FU Call Complete Date: 04/12/22  Transition Care Management Follow-up Telephone Call Date of Discharge: 04/08/22 Discharge Facility: [facility]) Type of Discharge: Inpatient Admission Primary Inpatient Discharge Diagnosis:: 's/p robot assisted surgical procedure,left upper lobe nodule" How have you been since you were released from the hospital?: Better (Pt states she is doing well since surgery.Pain controlled with only having to use Tylenol q6hrs as needed. She has been up walking around several times per day. Appetite good. LBM today.) Any questions or concerns?: No  Items Reviewed: Did you receive and understand the discharge instructions provided?: Yes Medications obtained and verified?: Yes (Medications Reviewed) Any new allergies since your discharge?: No Dietary orders reviewed?: Yes Type of Diet Ordered:: low salt/heart healthy Do you have support at home?: No  Home Care and Equipment/Supplies: Were Home Health Services Ordered?: NA Any new equipment or medical supplies ordered?: NA  Functional Questionnaire: Do you need assistance with bathing/showering or dressing?: No Do you need assistance with meal preparation?: No Do you need assistance with eating?: No Do you have difficulty maintaining continence: No Do you need assistance with getting out of bed/getting out of a chair/moving?: No Do you have difficulty managing or taking your medications?: No  Follow up appointments reviewed: PCP Follow-up appointment confirmed?: NA Specialist Hospital Follow-up appointment confirmed?: Yes Date of Specialist follow-up appointment?: 04/16/22 Follow-Up Specialty Provider:: Dr. Cliffton Asters Do you need transportation to your follow-up appointment?: No Do you understand care options if your condition(s) worsen?:  Yes-patient verbalized understanding  SDOH Interventions Today    Flowsheet Row Most Recent Value  SDOH Interventions   Food Insecurity Interventions Intervention Not Indicated  Transportation Interventions Intervention Not Indicated       TOC Interventions Today    Flowsheet Row Most Recent Value  TOC Interventions   TOC Interventions Discussed/Reviewed TOC Interventions Discussed, Post op wound/incision care, Post discharge activity limitations per provider, S/S of infection      Interventions Today    Flowsheet Row Most Recent Value  General Interventions   General Interventions Discussed/Reviewed General Interventions Discussed, Doctor Visits  Doctor Visits Discussed/Reviewed Doctor Visits Discussed, Specialist  PCP/Specialist Visits Compliance with follow-up visit  Education Interventions   Education Provided Provided Education  Provided Verbal Education On Nutrition, When to see the doctor, Medication, Other  Nutrition Interventions   Nutrition Discussed/Reviewed Nutrition Discussed, Adding fruits and vegetables, Decreasing salt  Pharmacy Interventions   Pharmacy Dicussed/Reviewed Pharmacy Topics Discussed, Medications and their functions  Safety Interventions   Safety Discussed/Reviewed Safety Discussed       Alessandra Grout [facility] Care Management Care Management Community Coordinator Direct Phone: [phone] Toll Free: [phone] Fax: [phone]

## 2022-04-13 ENCOUNTER — Ambulatory Visit (INDEPENDENT_AMBULATORY_CARE_PROVIDER_SITE_OTHER): Payer: Medicare PPO

## 2022-04-13 ENCOUNTER — Encounter (HOSPITAL_BASED_OUTPATIENT_CLINIC_OR_DEPARTMENT_OTHER): Payer: Self-pay

## 2022-04-13 VITALS — Ht 63.5 in | Wt 154.0 lb

## 2022-04-13 DIAGNOSIS — Z0000 Encounter for general adult medical examination without abnormal findings: Secondary | ICD-10-CM

## 2022-04-13 NOTE — Patient Instructions (Addendum)
Brittany Harper , Thank you for taking time to come for your Medicare Wellness Visit. I appreciate your ongoing commitment to your health goals. Please review the following plan we discussed and let me know if I can assist you in the future.   These are the goals we discussed:  Goals       DIET - INCREASE WATER INTAKE      Typically drinking 30 ounces a day and would like to increase to 45 ounces.       DIET - INCREASE WATER INTAKE      Reports she has been working on this over the last year, but not quite to the point that she wants to be. She plans to fill a water bottle in the morning with her day's total goal for water and refill her smaller water bottle that she drinks from and carry's with her throughout the day until she has finished her daily goal.      Patient stated (pt-stated)      I want to finish project for son.        This is a list of the screening recommended for you and due dates:  Health Maintenance  Topic Date Due   COVID-19 Vaccine (5 - 2023-24 season) 04/29/2022*   Flu Shot  08/05/2022   Medicare Annual Wellness Visit  04/13/2023   DTaP/Tdap/Td vaccine (3 - Td or Tdap) 12/14/2025   Pneumonia Vaccine  Completed   DEXA scan (bone density measurement)  Completed   Hepatitis C Screening: USPSTF Recommendation to screen - Ages 18-79 yo.  Completed   Zoster (Shingles) Vaccine  Completed   HPV Vaccine  Aged Out   Colon Cancer Screening  Discontinued   Cologuard (Stool DNA test)  Discontinued  *Topic was postponed. The date shown is not the original due date.   Opioid Pain Medicine Management Opioids are powerful medicines that are used to treat moderate to severe pain. When used for short periods of time, they can help you to: Sleep better. Do better in physical or occupational therapy. Feel better in the first few days after an injury. Recover from surgery. Opioids should be taken with the supervision of a trained health care provider. They should be taken for the  shortest period of time possible. This is because opioids can be addictive, and the longer you take opioids, the greater your risk of addiction. This addiction can also be called opioid use disorder. What are the risks? Using opioid pain medicines for longer than 3 days increases your risk of side effects. Side effects include: Constipation. Nausea and vomiting. Breathing difficulties (respiratory depression). Drowsiness. Confusion. Opioid use disorder. Itching. Taking opioid pain medicine for a long period of time can affect your ability to do daily tasks. It also puts you at risk for: Motor vehicle crashes. Depression. Suicide. Heart attack. Overdose, which can be life-threatening. What is a pain treatment plan? A pain treatment plan is an agreement between you and your health care provider. Pain is unique to each person, and treatments vary depending on your condition. To manage your pain, you and your health care provider need to work together. To help you do this: Discuss the goals of your treatment, including how much pain you might expect to have and how you will manage the pain. Review the risks and benefits of taking opioid medicines. Remember that a good treatment plan uses more than one approach and minimizes the chance of side effects. Be honest about the amount of  medicines you take and about any drug or alcohol use. Get pain medicine prescriptions from only one health care provider. Pain can be managed with many types of alternative treatments. Ask your health care provider to refer you to one or more specialists who can help you manage pain through: Physical or occupational therapy. Counseling (cognitive behavioral therapy). Good nutrition. Biofeedback. Massage. Meditation. Non-opioid medicine. Following a gentle exercise program. How to use opioid pain medicine Taking medicine Take your pain medicine exactly as told by your health care provider. Take it only when you  need it. If your pain gets less severe, you may take less than your prescribed dose if your health care provider approves. If you are not having pain, do nottake pain medicine unless your health care provider tells you to take it. If your pain is severe, do nottry to treat it yourself by taking more pills than instructed on your prescription. Contact your health care provider for help. Write down the times when you take your pain medicine. It is easy to become confused while on pain medicine. Writing the time can help you avoid overdose. Take other over-the-counter or prescription medicines only as told by your health care provider. Keeping yourself and others safe  While you are taking opioid pain medicine: Do not drive, use machinery, or power tools. Do not sign legal documents. Do not drink alcohol. Do not take sleeping pills. Do not supervise children by yourself. Do not do activities that require climbing or being in high places. Do not go to a lake, river, ocean, spa, or swimming pool. Do not share your pain medicine with anyone. Keep pain medicine in a locked cabinet or in a secure area where pets and children cannot reach it. Stopping your use of opioids If you have been taking opioid medicine for more than a few weeks, you may need to slowly decrease (taper) how much you take until you stop completely. Tapering your use of opioids can decrease your risk of symptoms of withdrawal, such as: Pain and cramping in the abdomen. Nausea. Sweating. Sleepiness. Restlessness. Uncontrollable shaking (tremors). Cravings for the medicine. Do not attempt to taper your use of opioids on your own. Talk with your health care provider about how to do this. Your health care provider may prescribe a step-down schedule based on how much medicine you are taking and how long you have been taking it. Getting rid of leftover pills Do not save any leftover pills. Get rid of leftover pills safely  by: Taking the medicine to a prescription take-back program. This is usually offered by the county or law enforcement. Bringing them to a pharmacy that has a drug disposal container. Flushing them down the toilet. Check the label or package insert of your medicine to see whether this is safe to do. Throwing them out in the trash. Check the label or package insert of your medicine to see whether this is safe to do. If it is safe to throw it out, remove the medicine from the original container, put it into a sealable bag or container, and mix it with used coffee grounds, food scraps, dirt, or cat litter before putting it in the trash. Follow these instructions at home: Activity Do exercises as told by your health care provider. Avoid activities that make your pain worse. Return to your normal activities as told by your health care provider. Ask your health care provider what activities are safe for you. General instructions You may need to take these  actions to prevent or treat constipation: Drink enough fluid to keep your urine pale yellow. Take over-the-counter or prescription medicines. Eat foods that are high in fiber, such as beans, whole grains, and fresh fruits and vegetables. Limit foods that are high in fat and processed sugars, such as fried or sweet foods. Keep all follow-up visits. This is important. Where to find support If you have been taking opioids for a long time, you may benefit from receiving support for quitting from a local support group or counselor. Ask your health care provider for a referral to these resources in your area. Where to find more information Centers for Disease Control and Prevention (CDC): FootballExhibition.com.br [place] Food and Drug Administration (FDA): PumpkinSearch.com.ee Get help right away if: You may have taken too much of an opioid (overdosed). Common symptoms of an overdose: Your breathing is slower or more shallow than normal. You have a very slow heartbeat  (pulse). You have slurred speech. You have nausea and vomiting. Your pupils become very small. You have other potential symptoms: You are very confused. You faint or feel like you will faint. You have cold, clammy skin. You have blue lips or fingernails. You have thoughts of harming yourself or harming others. These symptoms may represent a serious problem that is an emergency. Do not wait to see if the symptoms will go away. Get medical help right away. Call your local emergency services (911 in the [place] Do not drive yourself to the hospital.  If you ever feel like you may hurt yourself or others, or have thoughts about taking your own life, get help right away. Go to your nearest emergency department or: Call your local emergency services (911 in the [place] Call the [facility] (346 873 7689 in the [place] Call a suicide crisis helpline, such as the National Suicide Prevention Lifeline at (351)648-1847 or 988 in the [place] This is open 24 hours a day in the [place] Text the Crisis Text Line at (725)343-4879 (in the [place] Summary Opioid medicines can help you manage moderate to severe pain for a short period of time. A pain treatment plan is an agreement between you and your health care provider. Discuss the goals of your treatment, including how much pain you might expect to have and how you will manage the pain. If you think that you or someone else may have taken too much of an opioid, get medical help right away. This information is not intended to replace advice given to you by your health care provider. Make sure you discuss any questions you have with your health care provider. Document Revised: 07/16/2020 Document Reviewed: 04/02/2020 Elsevier Patient Education  2023 Elsevier Inc.  Advanced directives: In Chart  Conditions/risks identified: None  Next appointment: Follow up in one year for your annual wellness visit    Preventive Care 65 Years and Older,  Female Preventive care refers to lifestyle choices and visits with your health care provider that can promote health and wellness. What does preventive care include? A yearly physical exam. This is also called an annual well check. Dental exams once or twice a year. Routine eye exams. Ask your health care provider how often you should have your eyes checked. Personal lifestyle choices, including: Daily care of your teeth and gums. Regular physical activity. Eating a healthy diet. Avoiding tobacco and drug use. Limiting alcohol use. Practicing safe sex. Taking low-dose aspirin every day. Taking vitamin and mineral supplements as recommended by your health care provider. What happens during  an annual well check? The services and screenings done by your health care provider during your annual well check will depend on your age, overall health, lifestyle risk factors, and family history of disease. Counseling  Your health care provider may ask you questions about your: Alcohol use. Tobacco use. Drug use. Emotional well-being. Home and relationship well-being. Sexual activity. Eating habits. History of falls. Memory and ability to understand (cognition). Work and work Astronomerenvironment. Reproductive health. Screening  You may have the following tests or measurements: Height, weight, and BMI. Blood pressure. Lipid and cholesterol levels. These may be checked every 5 years, or more frequently if you are over 79 years old. Skin check. Lung cancer screening. You may have this screening every year starting at age 79 if you have a 30-pack-year history of smoking and currently smoke or have quit within the past 15 years. Fecal occult blood test (FOBT) of the stool. You may have this test every year starting at age 79. Flexible sigmoidoscopy or colonoscopy. You may have a sigmoidoscopy every 5 years or a colonoscopy every 10 years starting at age 79. Hepatitis C blood test. Hepatitis B blood  test. Sexually transmitted disease (STD) testing. Diabetes screening. This is done by checking your blood sugar (glucose) after you have not eaten for a while (fasting). You may have this done every 1-3 years. Bone density scan. This is done to screen for osteoporosis. You may have this done starting at age 79. Mammogram. This may be done every 1-2 years. Talk to your health care provider about how often you should have regular mammograms. Talk with your health care provider about your test results, treatment options, and if necessary, the need for more tests. Vaccines  Your health care provider may recommend certain vaccines, such as: Influenza vaccine. This is recommended every year. Tetanus, diphtheria, and acellular pertussis (Tdap, Td) vaccine. You may need a Td booster every 10 years. Zoster vaccine. You may need this after age 79. Pneumococcal 13-valent conjugate (PCV13) vaccine. One dose is recommended after age 79. Pneumococcal polysaccharide (PPSV23) vaccine. One dose is recommended after age 79. Talk to your health care provider about which screenings and vaccines you need and how often you need them. This information is not intended to replace advice given to you by your health care provider. Make sure you discuss any questions you have with your health care provider. Document Released: 01/17/2015 Document Revised: 09/10/2015 Document Reviewed: 10/22/2014 Elsevier Interactive Patient Education  2017 [employer]  Fall Prevention in the Home Falls can cause injuries. They can happen to people of all ages. There are many things you can do to make your home safe and to help prevent falls. What can I do on the outside of my home? Regularly fix the edges of walkways and driveways and fix any cracks. Remove anything that might make you trip as you walk through a door, such as a raised step or threshold. Trim any bushes or trees on the path to your home. Use bright outdoor  lighting. Clear any walking paths of anything that might make someone trip, such as rocks or tools. Regularly check to see if handrails are loose or broken. Make sure that both sides of any steps have handrails. Any raised decks and porches should have guardrails on the edges. Have any leaves, snow, or ice cleared regularly. Use sand or salt on walking paths during winter. Clean up any spills in your garage right away. This includes oil or grease spills. What can  I do in the bathroom? Use night lights. Install grab bars by the toilet and in the tub and shower. Do not use towel bars as grab bars. Use non-skid mats or decals in the tub or shower. If you need to sit down in the shower, use a plastic, non-slip stool. Keep the floor dry. Clean up any water that spills on the floor as soon as it happens. Remove soap buildup in the tub or shower regularly. Attach bath mats securely with double-sided non-slip rug tape. Do not have throw rugs and other things on the floor that can make you trip. What can I do in the bedroom? Use night lights. Make sure that you have a light by your bed that is easy to reach. Do not use any sheets or blankets that are too big for your bed. They should not hang down onto the floor. Have a firm chair that has side arms. You can use this for support while you get dressed. Do not have throw rugs and other things on the floor that can make you trip. What can I do in the kitchen? Clean up any spills right away. Avoid walking on wet floors. Keep items that you use a lot in easy-to-reach places. If you need to reach something above you, use a strong step stool that has a grab bar. Keep electrical cords out of the way. Do not use floor polish or wax that makes floors slippery. If you must use wax, use non-skid floor wax. Do not have throw rugs and other things on the floor that can make you trip. What can I do with my stairs? Do not leave any items on the stairs. Make  sure that there are handrails on both sides of the stairs and use them. Fix handrails that are broken or loose. Make sure that handrails are as long as the stairways. Check any carpeting to make sure that it is firmly attached to the stairs. Fix any carpet that is loose or worn. Avoid having throw rugs at the top or bottom of the stairs. If you do have throw rugs, attach them to the floor with carpet tape. Make sure that you have a light switch at the top of the stairs and the bottom of the stairs. If you do not have them, ask someone to add them for you. What else can I do to help prevent falls? Wear shoes that: Do not have high heels. Have rubber bottoms. Are comfortable and fit you well. Are closed at the toe. Do not wear sandals. If you use a stepladder: Make sure that it is fully opened. Do not climb a closed stepladder. Make sure that both sides of the stepladder are locked into place. Ask someone to hold it for you, if possible. Clearly mark and make sure that you can see: Any grab bars or handrails. First and last steps. Where the edge of each step is. Use tools that help you move around (mobility aids) if they are needed. These include: Canes. Walkers. Scooters. Crutches. Turn on the lights when you go into a dark area. Replace any light bulbs as soon as they burn out. Set up your furniture so you have a clear path. Avoid moving your furniture around. If any of your floors are uneven, fix them. If there are any pets around you, be aware of where they are. Review your medicines with your doctor. Some medicines can make you feel dizzy. This can increase your chance of falling. Ask  your doctor what other things that you can do to help prevent falls. This information is not intended to replace advice given to you by your health care provider. Make sure you discuss any questions you have with your health care provider. Document Released: 10/17/2008 Document Revised: 05/29/2015  Document Reviewed: 01/25/2014 Elsevier Interactive Patient Education  2017 [employer].

## 2022-04-13 NOTE — Progress Notes (Signed)
      [address]       [place] [address]             [phone]        [facility] Medical Record #[id number] Date of Birth: 09/21/1945  Referring: Josephine Igo, DO Primary Care: de [place], Buren Kos, MD Primary Cardiologist:Mark Anne Fu, MD  Reason for visit:   follow-up  History of Present Illness:     77 year old female presents for follow-up.  Overall she is doing well.  She has not had a reaction likely from the glue on the telemetry leads.  Physical Exam: BP (!) 161/78 (BP Location: Left Arm, Patient Position: Sitting)   Pulse 70   Resp 18   Ht  (1.6 m)   Wt 155 lb (70.3 kg)   SpO2 96% Comment: RA  BMI 27.46 kg/m   Alert NAD Incision clean.   Abdomen, ND No peripheral edema   Diagnostic Studies & Laboratory data: Path:  FINAL MICROSCOPIC DIAGNOSIS:  A. LUNG, LEFT UPPER LOBE, WEDGE RESECTION: Granulomatous inflammation with necrosis. Negative for malignancy. See comment.  B. LYMPH NODE, LEVEL 9, BIOPSY: Anthracotic lymph node. Negative for granulomas or metastasis.  C. LYMPH NODE, HILAR, BIOPSY: Anthracotic lymph node. Negative for granulomas or metastasis.  D. LYMPH NODE, LEVEL 6, BIOPSY: Anthracotic lymph node. Negative for granulomas or metastasis.  E. LYMPH NODE, LEVEL 5#1, BIOPSY: Anthracotic lymph node. Negative for granulomas or metastasis.  F. LYMPH NODE, LEVEL 5#2, BIOPSY: Anthracotic lymph node. Negative for granulomas or metastasis.  G. LYMPH NODE, LEVEL 5#3, BIOPSY: Anthracotic lymph node. Negative for granulomas or metastasis.  H. LYMPH NODE, LEVEL 5#4, BIOPSY: Anthracotic lymph node. Negative for granulomas or metastasis.  I. LYMPH NODE, LEVEL 5#5, BIOPSY: Anthracotic lymph node. Negative for granulomas or metastasis.  J. LYMPH NODE, LEVEL 5#6, BIOPSY: Anthracotic lymph node. Negative for granulomas or metastasis.  K. LYMPH NODE, HILAR #2, BIOPSY: Anthracotic lymph  node. Negative for granulomas or metastasis.  L. LYMPH NODE, HILAR #3, BIOPSY: Anthracotic lymph node. Negative for granulomas or metastasis.     Assessment / Plan:   77yo female s/p LUL wedge resection.  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  I will see her back in 1 month with a chest x-ray.   Corliss Skains 04/16/2022 1:39 PM

## 2022-04-13 NOTE — Progress Notes (Signed)
Subjective:   Brittany Harper is a 77 y.o. female who presents for Medicare Annual (Subsequent) preventive examination.  Review of Systems    Virtual Visit via Telephone Note  I connected with  Brittany Harper on 04/13/22 at 10:15 AM EDT by telephone and verified that I am speaking with the correct person using two identifiers.  Location: Patient: Home Provider: Office Persons participating in the virtual visit: patient/Nurse Health Advisor   I discussed the limitations, risks, security and privacy concerns of performing an evaluation and management service by telephone and the availability of in person appointments. The patient expressed understanding and agreed to proceed.  Interactive audio and video telecommunications were attempted between this nurse and patient, however failed, due to patient having technical difficulties OR patient did not have access to video capability.  We continued and completed visit with audio only.  Some vital signs may be absent or patient reported.   Tillie Rung, LPN  Cardiac Risk Factors include: advanced age (>76men, >96 women);Other (see comment), Risk factor comments: AFIB     Objective:    Today's Vitals   04/13/22 1024 04/13/22 1025  Weight: 154 lb (69.9 kg)   Height: 5' 3.5" (1.613 m)   PainSc:  0-No pain   Body mass index is 26.85 kg/m.     04/05/2022    1:14 PM 04/06/2021    9:39 AM 12/27/2020    3:38 PM 04/04/2020    8:38 AM 10/16/2018   11:00 AM 10/16/2018    6:37 AM  Advanced Directives  Does Patient Have a Medical Advance Directive? Yes Yes No Yes Yes Yes  Type of Estate agent of [place];Living will Healthcare Power of [place];Living will  Living will;Out of facility DNR (pink MOST or yellow form);Healthcare Power of [employer] of [place];Living will Healthcare Power of [place];Living will  Does patient want to make changes to medical advance directive?  No - Patient declined  Yes  (MAU/Ambulatory/Procedural Areas - Information given) No - Patient declined   Copy of Healthcare Power of Attorney in Chart? No - copy requested No - copy requested  No - copy requested No - copy requested No - copy requested  Would patient like information on creating a medical advance directive?   Yes (ED - Information included in AVS)       Current Medications (verified) Outpatient Encounter Medications as of 04/13/2022  Medication Sig   acetaminophen (TYLENOL) 500 MG tablet Take 1,000 mg by mouth every 6 (six) hours as needed for mild pain.   AMBULATORY NON FORMULARY MEDICATION Apply 1 application topically daily. Hempvanna lotion   apixaban (ELIQUIS) 5 MG TABS tablet Take 1 tablet (5 mg total) by mouth 2 (two) times daily.   Calcium Carbonate-Vit D-Min (CALCIUM 1200) 1200-1000 MG-UNIT CHEW Chew 2 tablets by mouth daily. Slow release   dronedarone (MULTAQ) 400 MG tablet Take 1 tablet (400 mg total) by mouth 2 (two) times daily with a meal.   fexofenadine (ALLEGRA) 180 MG tablet Take 180 mg by mouth daily.   hypromellose (SYSTANE OVERNIGHT THERAPY) 0.3 % GEL ophthalmic ointment Place 1 application into both eyes at bedtime.   metroNIDAZOLE (METROCREAM) 0.75 % cream Apply 1 application on the skin twice a day   Misc Natural Products (OSTEO BI-FLEX ADV JOINT SHIELD PO) Take 2 tablets by mouth daily with lunch.    Multiple Vitamins-Minerals (CENTRUM SILVER ULTRA WOMENS PO) Take 1 tablet by mouth daily at 12 noon. 50+   omeprazole (PRILOSEC)  40 MG capsule Take 1 capsule (40 mg total) by mouth daily.   Polyethyl Glycol-Propyl Glycol (SYSTANE) 0.4-0.3 % SOLN Place 1 drop into both eyes every morning.   simvastatin (ZOCOR) 5 MG tablet Take 1 tablet (5 mg total) by mouth daily.   traMADol (ULTRAM) 50 MG tablet Take 1 tablet (50 mg total) by mouth every 6 (six) hours as needed for moderate pain.   No facility-administered encounter medications on file as of 04/13/2022.    Allergies (verified) Ivp  dye [iodinated contrast media], Augmentin [amoxicillin-pot clavulanate], Demerol [meperidine], Monosodium glutamate, Morphine and related, and Shellfish allergy   History: Past Medical History:  Diagnosis Date   Allergy    Arthritis    Asthma    Cancer    basal cell   Dysrhythmia    A. fib   Gastric ulcer without hemorrhage or perforation    GERD (gastroesophageal reflux disease)    Hiatal hernia with GERD and esophagitis 10/16/2018   Hx of skin cancer, basal cell    Hyperlipidemia    Memory change 04/05/2016   Past Surgical History:  Procedure Laterality Date   ABDOMINAL HYSTERECTOMY     BIOPSY  10/16/2018   Procedure: BIOPSY;  Surgeon: Shellia Cleverly, DO;  Location: [facility];  Service: Gastroenterology;;   CATARACT EXTRACTION Bilateral    COLONOSCOPY     ESOPHAGOGASTRODUODENOSCOPY (EGD) WITH PROPOFOL N/A 10/16/2018   Procedure: ESOPHAGOGASTRODUODENOSCOPY (EGD) WITH PROPOFOL;  Surgeon: Shellia Cleverly, DO;  Location: [facility];  Service: Gastroenterology;  Laterality: N/A;   NODE DISSECTION Left 04/07/2022   Procedure: NODE DISSECTION;  Surgeon: Corliss Skains, MD;  Location: [facility];  Service: Thoracic;  Laterality: Left;   TRANSORAL INCISIONLESS FUNDOPLICATION N/A 10/16/2018   Procedure: TRANSORAL INCISIONLESS FUNDOPLICATION;  Surgeon: Shellia Cleverly, DO;  Location: [facility];  Service: Gastroenterology;  Laterality: N/A;   Family History  Problem Relation Age of Onset   Arthritis Mother    Breast cancer Mother        breast and ovarian, and basal cell   Ovarian cancer Mother    Basal cell carcinoma Mother    Arthritis Father    Basal cell carcinoma Father        basal cell   Heart attack Father    Diabetes Sister    Obesity Sister    Arthritis Sister    Diabetes Paternal Uncle    Diabetes Paternal Grandmother    Diabetes Paternal Grandfather    Stomach cancer Paternal Hollie Salk' disease Maternal Grandfather    Heart attack Maternal  Grandfather    Colon cancer Neg Hx    Pancreatic cancer Neg Hx    Social History   Socioeconomic History   Marital status: Widowed    Spouse name: Not on file   Number of children: 4   Years of education: 49   Highest education level: Master's degree (e.g., MA, MS, MEng, MEd, MSW, MBA)  Occupational History   Occupation: retired Runner, broadcasting/film/video   Tobacco Use   Smoking status: Never   Smokeless tobacco: Never  Vaping Use   Vaping Use: Never used  Substance and Sexual Activity   Alcohol use: Yes    Alcohol/week: 2.0 - 3.0 standard drinks of alcohol    Types: 1 Glasses of wine, 1 - 2 Standard drinks or equivalent per week    Comment: occasionally   Drug use: No   Sexual activity: Not Currently  Other Topics Concern   Not on  file  Social History Narrative   Lives in [facility]. Actively involved with social interactions and the community.    Social Determinants of Health   Financial Resource Strain: Low Risk  (04/13/2022)   Overall Financial Resource Strain (CARDIA)    Difficulty of Paying Living Expenses: Not hard at all  Food Insecurity: No Food Insecurity (04/13/2022)   Hunger Vital Sign    Worried About Running Out of Food in the Last Year: Never true    Ran Out of Food in the Last Year: Never true  Transportation Needs: No Transportation Needs (04/13/2022)   PRAPARE - Administrator, Civil Service (Medical): No    Lack of Transportation (Non-Medical): No  Physical Activity: Sufficiently Active (04/13/2022)   Exercise Vital Sign    Days of Exercise per Week: 5 days    Minutes of Exercise per Session: 30 min  Stress: No Stress Concern Present (04/13/2022)   [employer] of Occupational Health - Occupational Stress Questionnaire    Feeling of Stress : Not at all  Social Connections: Moderately Integrated (04/13/2022)   Social Connection and Isolation Panel [NHANES]    Frequency of Communication with Friends and Family: More than three times a week     Frequency of Social Gatherings with Friends and Family: More than three times a week    Attends Religious Services: More than 4 times per year    Active Member of [employer] or Organizations: Yes    Attends Banker Meetings: More than 4 times per year    Marital Status: Widowed    Tobacco Counseling Counseling given: Not Answered   Clinical Intake:  Pre-visit preparation completed: No  Pain : No/denies pain Pain Score: 0-No pain     BMI - recorded: 26.85 Nutritional Status: BMI 25 -29 Overweight Nutritional Risks: None Diabetes: No  How often do you need to have someone help you when you read instructions, pamphlets, or other written materials from your doctor or pharmacy?: 1 - Never  Diabetic?  No  Interpreter Needed?: No  Information entered by :: Brittany Mulligan LPN   Activities of Daily Living    04/13/2022   10:31 AM 04/05/2022    1:17 PM  In your present state of health, do you have any difficulty performing the following activities:  Hearing? 0   Vision? 0   Difficulty concentrating or making decisions? 0   Walking or climbing stairs? 0   Dressing or bathing? 0   Doing errands, shopping? 0 0  Preparing Food and eating ? N   Using the Toilet? N   In the past six months, have you accidently leaked urine? N   Do you have problems with loss of bowel control? N   Managing your Medications? N   Managing your Finances? N   Housekeeping or managing your Housekeeping? N     Patient Care Team: [place], Buren Kos, MD as PCP - General (Family Medicine) Jake Bathe, MD as PCP - Cardiology (Cardiology) Lanier Prude, MD as PCP - Electrophysiology (Cardiology)  Indicate any recent Medical Services you may have received from other than [facility] providers in the past year (date may be approximate).     Assessment:   This is a routine wellness examination for Brittany Harper.  Hearing/Vision screen Hearing Screening - Comments:: Denies hearing difficulties    Vision Screening - Comments:: Wears rx glasses - up to date with routine eye exams with  Dr Burgess Estelle  Dietary issues  and exercise activities discussed: Exercise limited by: None identified   Goals Addressed               This Visit's Progress     Patient stated (pt-stated)        I want to finish project for son.       Depression Screen    04/13/2022   10:31 AM 04/06/2021    8:59 AM 04/04/2020    8:40 AM 03/31/2020    9:07 AM 03/15/2019    8:59 AM 01/06/2018    8:02 AM 12/24/2016    8:02 AM  PHQ 2/9 Scores  PHQ - 2 Score 0 0 0 0 2 0 0  PHQ- 9 Score    0 4    Exception Documentation  Medical reason         Fall Risk    04/13/2022   10:31 AM 04/06/2021    8:59 AM 04/04/2020    8:39 AM 03/31/2020    9:07 AM 03/15/2019    8:59 AM  Fall Risk   Falls in the past year? 0 0 1  0  Number falls in past yr: 0 0 0 0 0  Injury with Fall? 0 0 0 1 0  Risk for fall due to : No Fall Risks No Fall Risks No Fall Risks No Fall Risks   Follow up Falls prevention discussed Falls evaluation completed Falls evaluation completed      FALL RISK PREVENTION PERTAINING TO THE HOME:  Any stairs in or around the home? No  If so, are there any without handrails? No  Home free of loose throw rugs in walkways, pet beds, electrical cords, etc? Yes  Adequate lighting in your home to reduce risk of falls? Yes   ASSISTIVE DEVICES UTILIZED TO PREVENT FALLS:  Life alert? No  Use of a cane, walker or w/c? No  Grab bars in the bathroom? Yes  Shower chair or bench in shower? Yes  Elevated toilet seat or a handicapped toilet? Yes   TIMED UP AND GO:  Was the test performed? No . Audio Visit   Cognitive Function:        04/13/2022   10:33 AM 04/06/2021    9:40 AM 04/04/2020    8:42 AM  6CIT Screen  What Year? 0 points 0 points 0 points  What month? 0 points 0 points 0 points  What time? 0 points 0 points 0 points  Count back from 20 0 points 0 points 0 points  Months in reverse 0 points 0 points 0  points  Repeat phrase 0 points 0 points 0 points  Total Score 0 points 0 points 0 points    Immunizations Immunization History  Administered Date(s) Administered   Fluad Quad(high Dose 65+) 10/06/2021   Influenza, High Dose Seasonal PF 10/04/2016, 09/07/2018   Influenza-Unspecified 10/16/2014, 09/15/2015, 10/03/2017, 09/21/2019, 09/14/2020   Moderna Covid-19 Vaccine Bivalent Booster 3750yrs & up 10/25/2020   Moderna SARS-COV2 Booster Vaccination 04/17/2020   Moderna Sars-Covid-2 Vaccination 01/16/2019, 02/14/2019   Pneumococcal Conjugate-13 12/12/2014   Pneumococcal Polysaccharide-23 10/22/2008   Respiratory Syncytial Virus Vaccine,Recomb Aduvanted(Arexvy) 12/03/2021   Tdap 06/17/2011, 12/15/2015   Unspecified SARS-COV-2 Vaccination 11/04/2021   Zoster Recombinat (Shingrix) 01/17/2017, 03/28/2017   Zoster, Live 01/05/2004    TDAP status: Up to date  Flu Vaccine status: Up to date  Pneumococcal vaccine status: Up to date  Covid-19 vaccine status: Completed vaccines  Qualifies for Shingles Vaccine? Yes   Zostavax completed Yes  Shingrix Completed?: Yes  Screening Tests Health Maintenance  Topic Date Due   COVID-19 Vaccine (5 - 2023-24 season) 04/29/2022 (Originally 12/30/2021)   INFLUENZA VACCINE  08/05/2022   Medicare Annual Wellness (AWV)  04/13/2023   DTaP/Tdap/Td (3 - Td or Tdap) 12/14/2025   Pneumonia Vaccine 35+ Years old  Completed   DEXA SCAN  Completed   Hepatitis C Screening  Completed   Zoster Vaccines- Shingrix  Completed   HPV VACCINES  Aged Out   COLONOSCOPY (Pts 45-30yrs Insurance coverage will need to be confirmed)  Discontinued   Fecal DNA (Cologuard)  Discontinued    Health Maintenance  There are no preventive care reminders to display for this patient.   Colorectal cancer screening: No longer required.   Mammogram status: No longer required due to Age.  Bone Density status: Completed 06/09/20. Results reflect: Bone density results:  OSTEOPOROSIS. Repeat every   years.  Lung Cancer Screening: (Low Dose CT Chest recommended if Age 45-80 years, 30 pack-year currently smoking OR have quit w/in 15years.) does not qualify.    Additional Screening:  Hepatitis C Screening: does qualify; Completed 07/17/15  Vision Screening: Recommended annual ophthalmology exams for early detection of glaucoma and other disorders of the eye. Is the patient up to date with their annual eye exam?  Yes  Who is the provider or what is the name of the office in which the patient attends annual eye exams? Dr Burgess Estelle If pt is not established with a provider, would they like to be referred to a provider to establish care? No .   Dental Screening: Recommended annual dental exams for proper oral hygiene  Community Resource Referral / Chronic Care Management:  CRR required this visit?  No   CCM required this visit?  No      Plan:     I have personally reviewed and noted the following in the patient's chart:   Medical and social history Use of alcohol, tobacco or illicit drugs  Current medications and supplements including opioid prescriptions. Patient is currently taking opioid prescriptions. Information provided to patient regarding non-opioid alternatives. Patient advised to discuss non-opioid treatment plan with their provider. Functional ability and status Nutritional status Physical activity Advanced directives List of other physicians Hospitalizations, surgeries, and ER visits in previous 12 months Vitals Screenings to include cognitive, depression, and falls Referrals and appointments  In addition, I have reviewed and discussed with patient certain preventive protocols, quality metrics, and best practice recommendations. A written personalized care plan for preventive services as well as general preventive health recommendations were provided to patient.     Tillie Rung, LPN   8/0/8811   Nurse Notes:Patient request f/u  with concerns of continued Hot Flashes.

## 2022-04-16 ENCOUNTER — Ambulatory Visit (INDEPENDENT_AMBULATORY_CARE_PROVIDER_SITE_OTHER): Payer: Self-pay | Admitting: Thoracic Surgery (Cardiothoracic Vascular Surgery)

## 2022-04-16 VITALS — BP 161/78 | HR 70 | Resp 18 | Ht 63.0 in | Wt 155.0 lb

## 2022-04-16 DIAGNOSIS — Z9889 Other specified postprocedural states: Secondary | ICD-10-CM

## 2022-04-19 ENCOUNTER — Other Ambulatory Visit (HOSPITAL_BASED_OUTPATIENT_CLINIC_OR_DEPARTMENT_OTHER): Payer: Self-pay | Admitting: Nurse Practitioner

## 2022-04-19 DIAGNOSIS — K219 Gastro-esophageal reflux disease without esophagitis: Secondary | ICD-10-CM

## 2022-04-19 DIAGNOSIS — Z7689 Persons encountering health services in other specified circumstances: Secondary | ICD-10-CM

## 2022-04-19 MED ORDER — OMEPRAZOLE 40 MG PO CPDR
40.0000 mg | DELAYED_RELEASE_CAPSULE | Freq: Every day | ORAL | 0 refills | Status: DC
Start: 2022-04-19 — End: 2022-07-05
  Filled 2022-04-19: qty 90, 90d supply, fill #0

## 2022-04-19 NOTE — Telephone Encounter (Signed)
Received refill request for PPI. Patient last seen by Minna Merritts more than 1 year ago. Message sent to staff to contact patient for follow-up visit if she is continuing to see [place] as her PCP. For now, will allow for short supply of PPI for patient to arrange office visit with [place] or to schedule with alternative PCP if chosen.

## 2022-04-20 ENCOUNTER — Other Ambulatory Visit: Payer: Self-pay

## 2022-04-20 ENCOUNTER — Other Ambulatory Visit (HOSPITAL_BASED_OUTPATIENT_CLINIC_OR_DEPARTMENT_OTHER): Payer: Self-pay

## 2022-04-27 ENCOUNTER — Other Ambulatory Visit (HOSPITAL_BASED_OUTPATIENT_CLINIC_OR_DEPARTMENT_OTHER): Payer: Self-pay | Admitting: Family Medicine

## 2022-04-27 ENCOUNTER — Other Ambulatory Visit (HOSPITAL_BASED_OUTPATIENT_CLINIC_OR_DEPARTMENT_OTHER): Payer: Self-pay

## 2022-04-27 DIAGNOSIS — E782 Mixed hyperlipidemia: Secondary | ICD-10-CM

## 2022-04-28 ENCOUNTER — Other Ambulatory Visit (HOSPITAL_BASED_OUTPATIENT_CLINIC_OR_DEPARTMENT_OTHER): Payer: Self-pay

## 2022-04-28 ENCOUNTER — Other Ambulatory Visit: Payer: Self-pay

## 2022-04-28 MED ORDER — SIMVASTATIN 5 MG PO TABS
5.0000 mg | ORAL_TABLET | Freq: Every day | ORAL | 0 refills | Status: DC
Start: 2022-04-28 — End: 2022-05-24
  Filled 2022-04-28: qty 30, 30d supply, fill #0

## 2022-05-03 ENCOUNTER — Telehealth (HOSPITAL_BASED_OUTPATIENT_CLINIC_OR_DEPARTMENT_OTHER): Payer: Self-pay | Admitting: Family Medicine

## 2022-05-03 NOTE — Telephone Encounter (Signed)
I have left several messages for the pt to call us.

## 2022-05-03 NOTE — Telephone Encounter (Signed)
-----   Message from Hosie Poisson [place], MD sent at 04/19/2022 10:59 PM EDT ----- Patient needs appt scheduled for follow-up if she is continuing to be seen in our clinic. Her last office visit was January 2023 with Brittany Harper. Please schedule office visit to review chronic medical issues.

## 2022-05-10 LAB — FUNGUS CULTURE WITH STAIN

## 2022-05-10 LAB — FUNGAL ORGANISM REFLEX

## 2022-05-10 LAB — FUNGUS CULTURE RESULT

## 2022-05-17 NOTE — Progress Notes (Unsigned)
  Electrophysiology Office Follow up Visit Note:    Date:  05/17/2022   ID:  Brittany Harper, DOB October 13, 1945, MRN [id number]  PCP:  de [place], Buren Kos, MD  [facility] Cardiologist:  Donato Schultz, MD  [facility] Electrophysiologist:  Lanier Prude, MD    Interval History:    Brittany Harper is a 77 y.o. female who presents for a follow up visit.   I previously saw her for her AF. Pre op CT showed a lung mass that has ultimately been biopsied.  She was previously on multaq for rhythm control. We also discussed LAAO during our previous appointments but this was also cancelled because of the lung mass.        Past medical, surgical, social and family history were reviewed.  ROS:   Please see the history of present illness.    All other systems reviewed and are negative.  EKGs/Labs/Other Studies Reviewed:    The following studies were reviewed today:  EKG:  The ekg ordered today demonstrates ***   Physical Exam:    VS:  There were no vitals taken for this visit.    Wt Readings from Last 3 Encounters:  04/16/22 155 lb (70.3 kg)  04/13/22 154 lb (69.9 kg)  04/07/22 153 lb (69.4 kg)     GEN: *** Well nourished, well developed in no acute distress CARDIAC: ***RRR, no murmurs, rubs, gallops RESPIRATORY:  Clear to auscultation without rales, wheezing or rhonchi       ASSESSMENT:    1. PAF (paroxysmal atrial fibrillation) (HCC)   2. Lung nodule    PLAN:    In order of problems listed above:   #pAF On eliquis for stroke ppx.  ?ablation and watchman  #Lung nodule S/p resection by Dr Cliffton Asters.       Signed, Steffanie Dunn, MD, [place], [facility] 05/17/2022 10:23 PM    Electrophysiology [facility]

## 2022-05-18 ENCOUNTER — Ambulatory Visit: Payer: Medicare PPO | Attending: Cardiology | Admitting: Cardiology

## 2022-05-18 ENCOUNTER — Encounter: Payer: Self-pay | Admitting: Cardiology

## 2022-05-18 VITALS — BP 142/78 | HR 53 | Ht 63.0 in | Wt 153.2 lb

## 2022-05-18 DIAGNOSIS — R911 Solitary pulmonary nodule: Secondary | ICD-10-CM | POA: Diagnosis not present

## 2022-05-18 DIAGNOSIS — I480 Paroxysmal atrial fibrillation: Secondary | ICD-10-CM

## 2022-05-18 NOTE — Progress Notes (Signed)
**Note Brittany-Identified via Obfuscation** Electrophysiology Office Follow up Visit Note:    Date:  05/18/2022   ID:  Brittany Harper, DOB 05-May-1945, MRN [id number]  PCP:  Brittany Peru, Buren Kos, MD  [facility] Cardiologist:  Donato Schultz, MD  [facility] Electrophysiologist:  Brittany Prude, MD    Interval History:    Brittany Harper is a 77 y.o. female who presents for a follow up visit.   I previously saw her for her AF. Pre op CT showed a lung mass that has ultimately been biopsied.  She was previously on multaq for rhythm control. We also discussed LAAO during our previous appointments but this was also cancelled because of the lung mass.   Today, she states she is feeling good and has recovered well aside from some mild pruritus along one surgical scar. She is treating this with a topical cream. She has a follow-up appointment with Dr. Cliffton Asters this Friday.  Every week she monitors her heart rhythm at home and denies noticing any arrhythmias.  She denies any palpitations, chest pain, shortness of breath, or peripheral edema. No lightheadedness, headaches, syncope, orthopnea, or PND.     Past medical, surgical, social and family history were reviewed.  ROS:   Please see the history of present illness.    All other systems reviewed and are negative.  EKGs/Labs/Other Studies Reviewed:    The following studies were reviewed today:  EKG:  The ekg ordered today demonstrates sinus bradycardia   Physical Exam:    VS:  BP (!) 142/78   Pulse (!) 53   Ht 5\' 3"  (1.6 m)   Wt 153 lb 3.2 oz (69.5 kg)   SpO2 96%   BMI 27.14 kg/m     Wt Readings from Last 3 Encounters:  05/18/22 153 lb 3.2 oz (69.5 kg)  04/16/22 155 lb (70.3 kg)  04/13/22 154 lb (69.9 kg)     GEN:  Well nourished, well developed in no acute distress CARDIAC: RRR, no murmurs, rubs, gallops RESPIRATORY:  Clear to auscultation without rales, wheezing or rhonchi       ASSESSMENT:    1. PAF (paroxysmal atrial fibrillation) (HCC)   2. Lung  nodule    PLAN:    In order of problems listed above:   #pAF On eliquis for stroke ppx. Still desires a rhythm control strategy that avoids long term exposure to antiarrhythmic drugs which is veyr reasonable. I have discussed catheter ablation and she would like to proceed.  Discussed treatment options today for AF including antiarrhythmic drug therapy and ablation. Discussed risks, recovery and likelihood of success with each treatment strategy. Risk, benefits, and alternatives to EP study and radiofrequency ablation for afib were discussed. These risks include but are not limited to stroke, bleeding, vascular damage, tamponade, perforation, damage to the esophagus, lungs, and other structures, pulmonary vein stenosis, worsening renal function, and death.  Discussed potential need for repeat ablation procedures and antiarrhythmic drugs after an initial ablation. The patient understands these risk and wishes to proceed.  We will therefore proceed with catheter ablation at the next available time.  Carto, ICE, anesthesia are requested for the procedure.  Will also obtain CT PV protocol prior to the procedure to exclude LAA thrombus and further evaluate atrial anatomy.  -----------   I have seen Brittany Harper in the office today who is being considered for a Watchman left atrial appendage closure device. I believe they will benefit from this procedure given their history of atrial fibrillation, CHA2DS2-VASc score of 3  and unadjusted ischemic stroke rate of 3.2% per year. [name] chart has been reviewed and I feel that they would be a candidate for short term oral anticoagulation after Watchman implant.    It is my belief that after undergoing a LAA closure procedure, Brittany Harper will not need long term anticoagulation which eliminates anticoagulation side effects and major bleeding risk.    Procedural risks for the Watchman implant have been reviewed with the patient including a 0.5% risk of  stroke, <1% risk of perforation and <1% risk of device embolization. Other risks include bleeding, vascular damage, tamponade, worsening renal function, and death. The patient understands these risk and wishes to proceed.       The published clinical data on the safety and effectiveness of WATCHMAN include but are not limited to the following: - Holmes DR, Everlene Farrier, Sick P et al. for the PROTECT AF Investigators. Percutaneous closure of the left atrial appendage versus warfarin therapy for prevention of stroke in patients with atrial fibrillation: a randomised non-inferiority trial. Lancet 2009; 374: 534-42. Everlene Farrier, Doshi SK, Isa Rankin D et al. on behalf of the PROTECT AF Investigators. Percutaneous Left Atrial Appendage Closure for Stroke Prophylaxis in Patients With Atrial Fibrillation 2.3-Year Follow-up of the PROTECT AF (Watchman Left Atrial Appendage System for Embolic Protection in Patients With Atrial Fibrillation) Trial. Circulation 2013; 127:720-729. - Alli O, Doshi S,  Kar S, Reddy VY, Sievert H et al. Quality of Life Assessment in the Randomized PROTECT AF (Percutaneous Closure of the Left Atrial Appendage Versus Warfarin Therapy for Prevention of Stroke in Patients With Atrial Fibrillation) Trial of Patients at Risk for Stroke With Nonvalvular Atrial Fibrillation. J Am Coll Cardiol 2013; 61:1790-8. Aline August DR, [place]ia [place], Price M, Whisenant B, Sievert H, Doshi S, Huber K, Reddy V. Prospective randomized evaluation of the Watchman left atrial appendage Device in patients with atrial fibrillation versus long-term warfarin therapy; the PREVAIL trial. Journal of the [employer] of Cardiology, Vol. 4, No. 1, 2014, 1-11. - Kar S, Doshi SK, Sadhu A, Horton R, Osorio J et al. Primary outcome evaluation of a next-generation left atrial appendage closure device: results from the PINNACLE FLX trial. Circulation 2021;143(18)1754-1762.      After today's visit with the patient which was  dedicated solely for shared decision making visit regarding LAA closure device, the patient decided to proceed with the LAA appendage closure procedure scheduled to be done in the near future at [facility]. Prior to the procedure, I would like to obtain a gated CT scan of the chest with contrast timed for PV/LA visualization.      HAS-BLED score 1 Hypertension No  Abnormal renal and liver function (Dialysis, transplant, Cr >2.26 mg/dL /Cirrhosis or Bilirubin >2x Normal or AST/ALT/AP >3x Normal) No  Stroke No  Bleeding No  Labile INR (Unstable/high INR) No  Elderly (>65) Yes  Drugs or alcohol (? 8 drinks/week, anti-plt or NSAID) No    CHA2DS2-VASc Score = 3  [name] score is based upon: CHF History: 0 HTN History: 0 Diabetes History: 0 Stroke History: 0 Vascular Disease History: 0 Age Score: 2 Gender Score: 1     #Lung nodule S/p resection by Dr Cliffton Asters. Sees him in follow up this week. Seems to have recovered well post op.      I,Mathew Stumpf,acting as a Neurosurgeon for Brittany Prude, MD.,have documented all relevant documentation on the behalf of Brittany Prude, MD,as directed by  [facility] T  Dail Meece, MD while in the presence of Brittany Prude, MD.  I, Brittany Prude, MD, have reviewed all documentation for this visit. The documentation on 05/18/22 for the exam, diagnosis, procedures, and orders are all accurate and complete.   Signed, Steffanie Dunn, MD, [facility], [facility] 05/18/2022 12:32 PM    Electrophysiology [facility]

## 2022-05-18 NOTE — Patient Instructions (Addendum)
Medication Instructions:  Your physician recommends that you continue on your current medications as directed. Please refer to the Current Medication list given to you today.  *If you need a refill on your cardiac medications before your next appointment, please call your pharmacy*  Lab Work: BMET and CBC prior to CT scan and ablation - on August 14th between 7:30am and 4:30pm  Testing/Procedures: Your physician has recommended that you have an ablation. Catheter ablation is a medical procedure used to treat some cardiac arrhythmias (irregular heartbeats). During catheter ablation, a long, thin, flexible tube is put into a blood vessel in your groin (upper thigh), or neck. This tube is called an ablation catheter. It is then guided to your heart through the blood vessel. Radio frequency waves destroy small areas of heart tissue where abnormal heartbeats may cause an arrhythmia to start. Please see the instruction sheet given to you today. You are scheduled for Atrial Fibrillation Ablation on Tuesday, August 27 with Dr. Steffanie Dunn.Please arrive at the Main Entrance A at [facility]: [address] [place], [place] [address] at 5:30 AM    Follow-Up: At [facility], you and your health needs are our priority.  As part of our continuing mission to provide you with exceptional heart care, we have created designated Provider Care Teams.  These Care Teams include your primary Cardiologist (physician) and Advanced Practice Providers (APPs -  Physician Assistants and Nurse Practitioners) who all work together to provide you with the care you need, when you need it.  Your next appointment:   We will call you to arrange follow up appointments.

## 2022-05-19 ENCOUNTER — Other Ambulatory Visit: Payer: Self-pay | Admitting: Thoracic Surgery (Cardiothoracic Vascular Surgery)

## 2022-05-19 DIAGNOSIS — R911 Solitary pulmonary nodule: Secondary | ICD-10-CM

## 2022-05-21 ENCOUNTER — Ambulatory Visit
Admission: RE | Admit: 2022-05-21 | Discharge: 2022-05-21 | Disposition: A | Discharge status: Home / Self Care | Payer: Medicare PPO | Source: Ambulatory Visit | Attending: Thoracic Surgery (Cardiothoracic Vascular Surgery) | Admitting: Thoracic Surgery (Cardiothoracic Vascular Surgery)

## 2022-05-21 ENCOUNTER — Ambulatory Visit (INDEPENDENT_AMBULATORY_CARE_PROVIDER_SITE_OTHER): Payer: Self-pay | Admitting: Thoracic Surgery (Cardiothoracic Vascular Surgery)

## 2022-05-21 ENCOUNTER — Encounter: Payer: Self-pay | Admitting: Thoracic Surgery (Cardiothoracic Vascular Surgery)

## 2022-05-21 VITALS — BP 137/79 | HR 60 | Resp 20 | Ht 63.0 in | Wt 153.0 lb

## 2022-05-21 DIAGNOSIS — R911 Solitary pulmonary nodule: Secondary | ICD-10-CM

## 2022-05-21 DIAGNOSIS — J90 Pleural effusion, not elsewhere classified: Secondary | ICD-10-CM | POA: Diagnosis not present

## 2022-05-21 DIAGNOSIS — Z9889 Other specified postprocedural states: Secondary | ICD-10-CM

## 2022-05-21 DIAGNOSIS — J9811 Atelectasis: Secondary | ICD-10-CM | POA: Diagnosis not present

## 2022-05-21 NOTE — Progress Notes (Signed)
      [address]       [place] [address]             [phone]        Amby Goubeaux [facility] Medical Record #[id number] Date of Birth: Jul 15, 1945  Referring: Josephine Igo, DO Primary Care: de Peru, Buren Kos, MD Primary Cardiologist:Mark Anne Fu, MD  Reason for visit:   follow-up  History of Present Illness:     77yo female presents for her 1 month follow-up.  Overall, she is doing well.    Physical Exam: BP 137/79 (BP Location: Left Arm, Patient Position: Sitting, Cuff Size: Normal)   Pulse 60   Resp 20   Ht 5\' 3"  (1.6 m)   Wt 153 lb (69.4 kg)   SpO2 97% Comment: RA  BMI 27.10 kg/m   Alert NAD Abdomen, ND no peripheral edema   Diagnostic Studies & Laboratory data: CXR: small L effusion     Assessment / Plan:   77yo female s/p L RATS, wedge resection.  Overall, she is doing well.  Pathology was negative for malignancy.  She is asymptomatic from a respiratory standpoint, so I do not think that she will need a thoracentesis for this small effusion.  She will follow-up as needed.     Corliss Skains 05/21/2022 10:28 AM

## 2022-05-24 ENCOUNTER — Other Ambulatory Visit (HOSPITAL_BASED_OUTPATIENT_CLINIC_OR_DEPARTMENT_OTHER): Payer: Self-pay | Admitting: Family Medicine

## 2022-05-24 ENCOUNTER — Other Ambulatory Visit: Payer: Self-pay

## 2022-05-24 ENCOUNTER — Other Ambulatory Visit (HOSPITAL_BASED_OUTPATIENT_CLINIC_OR_DEPARTMENT_OTHER): Payer: Self-pay

## 2022-05-24 DIAGNOSIS — E782 Mixed hyperlipidemia: Secondary | ICD-10-CM

## 2022-05-24 LAB — ACID FAST CULTURE WITH REFLEXED SENSITIVITIES (MYCOBACTERIA): Acid Fast Culture: NEGATIVE

## 2022-05-26 ENCOUNTER — Other Ambulatory Visit (HOSPITAL_BASED_OUTPATIENT_CLINIC_OR_DEPARTMENT_OTHER): Payer: Self-pay

## 2022-05-26 MED ORDER — SIMVASTATIN 5 MG PO TABS
5.0000 mg | ORAL_TABLET | Freq: Every day | ORAL | 0 refills | Status: DC
Start: 2022-05-26 — End: 2022-07-05
  Filled 2022-05-26: qty 30, 30d supply, fill #0

## 2022-06-04 ENCOUNTER — Other Ambulatory Visit (HOSPITAL_BASED_OUTPATIENT_CLINIC_OR_DEPARTMENT_OTHER): Payer: Self-pay | Admitting: Family Medicine

## 2022-06-04 DIAGNOSIS — Z1231 Encounter for screening mammogram for malignant neoplasm of breast: Secondary | ICD-10-CM

## 2022-06-28 ENCOUNTER — Ambulatory Visit: Payer: Medicare PPO | Admitting: Cardiology

## 2022-07-01 ENCOUNTER — Telehealth (HOSPITAL_BASED_OUTPATIENT_CLINIC_OR_DEPARTMENT_OTHER): Payer: Self-pay | Admitting: Family Medicine

## 2022-07-01 ENCOUNTER — Other Ambulatory Visit (HOSPITAL_BASED_OUTPATIENT_CLINIC_OR_DEPARTMENT_OTHER): Payer: Self-pay | Admitting: Family Medicine

## 2022-07-01 ENCOUNTER — Other Ambulatory Visit (HOSPITAL_BASED_OUTPATIENT_CLINIC_OR_DEPARTMENT_OTHER): Payer: Self-pay

## 2022-07-01 ENCOUNTER — Encounter (HOSPITAL_BASED_OUTPATIENT_CLINIC_OR_DEPARTMENT_OTHER): Payer: Self-pay | Admitting: Pharmacist

## 2022-07-01 DIAGNOSIS — E782 Mixed hyperlipidemia: Secondary | ICD-10-CM

## 2022-07-01 NOTE — Telephone Encounter (Signed)
Patient called stated need refill but pharmacy stated not going through Simvastatin

## 2022-07-05 ENCOUNTER — Ambulatory Visit (INDEPENDENT_AMBULATORY_CARE_PROVIDER_SITE_OTHER): Payer: Medicare PPO | Admitting: Family Medicine

## 2022-07-05 ENCOUNTER — Telehealth (HOSPITAL_BASED_OUTPATIENT_CLINIC_OR_DEPARTMENT_OTHER): Payer: Self-pay | Admitting: Family Medicine

## 2022-07-05 ENCOUNTER — Other Ambulatory Visit (HOSPITAL_BASED_OUTPATIENT_CLINIC_OR_DEPARTMENT_OTHER): Payer: Self-pay

## 2022-07-05 ENCOUNTER — Other Ambulatory Visit (HOSPITAL_BASED_OUTPATIENT_CLINIC_OR_DEPARTMENT_OTHER): Payer: Self-pay | Admitting: Family Medicine

## 2022-07-05 VITALS — BP 139/62 | HR 54 | Ht 63.0 in | Wt 150.0 lb

## 2022-07-05 DIAGNOSIS — N951 Menopausal and female climacteric states: Secondary | ICD-10-CM | POA: Diagnosis not present

## 2022-07-05 DIAGNOSIS — E782 Mixed hyperlipidemia: Secondary | ICD-10-CM | POA: Diagnosis not present

## 2022-07-05 DIAGNOSIS — K2100 Gastro-esophageal reflux disease with esophagitis, without bleeding: Secondary | ICD-10-CM

## 2022-07-05 MED ORDER — OMEPRAZOLE 40 MG PO CPDR
40.0000 mg | DELAYED_RELEASE_CAPSULE | Freq: Every day | ORAL | 3 refills | Status: DC
Start: 2022-07-05 — End: 2022-08-31
  Filled 2022-07-05: qty 90, 90d supply, fill #0

## 2022-07-05 MED ORDER — SIMVASTATIN 5 MG PO TABS
5.0000 mg | ORAL_TABLET | Freq: Every day | ORAL | 3 refills | Status: DC
  Filled 2022-07-05: qty 30, 30d supply, fill #0
  Filled 2022-07-05 (×2): qty 90, 90d supply, fill #0

## 2022-07-05 NOTE — Telephone Encounter (Signed)
Prior phone encounters show that when pt has been attempted to be called to schedule an appt, pt was never able to get ahold of. I have sent pt a mychart message stating to her that she needs to call the office to schedule a follow up appt as she has not been seen since 2023 when she last saw Enid Skeens, NP.

## 2022-07-05 NOTE — Assessment & Plan Note (Signed)
Stable. Continue Omeprazole 40mg  daily. Avoid triggering foods.

## 2022-07-05 NOTE — Patient Instructions (Addendum)
   Menopause Resources   Alternative Therapies Recommended:   Cognitive-behavioral therapy  SSRI/SNRI (selective serotonin reuptake inhibitors/ serotonin-norepinephrine reuptake inhibitors)  - Such as Paxil, Prozac, Lexapro, Duloxetine, Effexor  Gabapentin  Fezolinetant ([place]) Neurokinin 3 Receptor Aantagonist    No clinical evidence to support the further recommendations:   Supplements/herbal remedies (Black Cohosh, Evening Primrose, etc)  Soy foods and soy extracts  Cannabinoids  Acupuncture

## 2022-07-05 NOTE — Telephone Encounter (Signed)
Refill has been sent to the pharmacy.  

## 2022-07-05 NOTE — Progress Notes (Signed)
Subjective:   Brittany Harper 03/13/1945 07/05/2022  Chief Complaint  Patient presents with   New Patient (Initial Visit)    Former pt of Enid Skeens, NP here to establish with new provider. Pt is also needing med refills. Pt denies any current concerns.    HPI: Brittany Harper presents today for re-assessment and management of chronic medical conditions.   HYPERLIPIDEMIA: Monisa Heiskell presents for the medical management of hyperlipidemia.  Patient's current HLD regimen is: Simvastatin 5mg  daily Patient is  currently taking prescribed medications for HLD.  Adhering to heathy diet: Yes Exercising regularly: Yes Denies myalgias.  Lab Results  Component Value Date   CHOL 182 03/31/2020   HDL 67 03/31/2020   LDLCALC 93 03/31/2020   TRIG 108 03/31/2020   CHOLHDL 2.7 03/31/2020   GERD: Brittany Harper presents for the medical management of GERD.  Current medication: Omeprazole 40mg  daily Well controlled: Not entirely controlled. Hx of hiatal hernia that contributes to symptoms.    Abdominal pain: No Dysphagia: No Nausea/Vomiting: No Hematemesis: No  Blood in stool: No Alcohol Use: No  Recent EGD: 2020   Vasomotor Symptoms:  Patient reports several years of hot flashes with menopause. She states she has tried hormonal therapy, SSRIs without relief. She was interested in [place] but cannot afford. She has tried OTC herbal blends as well without relief.    The following portions of the patient's history were reviewed and updated as appropriate: past medical history, past surgical history, family history, social history, allergies, medications, and problem list.   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 fib   Gastric ulcer without hemorrhage or perforation    GERD (gastroesophageal reflux disease)    Hiatal hernia with GERD and esophagitis 10/16/2018   Hx of skin cancer, basal cell    Hyperlipidemia    Memory change 04/05/2016   Past Surgical History:  Procedure Laterality Date   ABDOMINAL HYSTERECTOMY     BIOPSY  10/16/2018   Procedure: BIOPSY;  Surgeon: Shellia Cleverly, DO;  Location: [facility];  Service: Gastroenterology;;   CATARACT EXTRACTION Bilateral    COLONOSCOPY     ESOPHAGOGASTRODUODENOSCOPY (EGD) WITH PROPOFOL N/A 10/16/2018   Procedure: ESOPHAGOGASTRODUODENOSCOPY (EGD) WITH PROPOFOL;  Surgeon: Shellia Cleverly, DO;  Location: [facility];  Service: Gastroenterology;  Laterality: N/A;   LUNG SURGERY Left 04/07/2022   NODE DISSECTION Left 04/07/2022   Procedure: NODE DISSECTION;  Surgeon: Corliss Skains, MD;  Location: [facility] OR;  Service: Thoracic;  Laterality: Left;   TRANSORAL INCISIONLESS FUNDOPLICATION N/A 10/16/2018   Procedure: TRANSORAL INCISIONLESS FUNDOPLICATION;  Surgeon: Shellia Cleverly, DO;  Location: [facility];  Service: Gastroenterology;  Laterality: N/A;  Family History  Problem Relation Age of Onset   Arthritis Mother    Breast cancer Mother        breast and ovarian, and basal cell   Ovarian cancer Mother    Basal cell carcinoma Mother    Arthritis Father    Basal cell carcinoma Father        basal cell   Heart attack Father    Diabetes Sister     Obesity Sister    Arthritis Sister    Diabetes Paternal Uncle    Diabetes Paternal Grandmother    Diabetes Paternal Grandfather    Stomach cancer Paternal Brittany Harper' disease Maternal Grandfather    Heart attack Maternal Grandfather    Colon cancer Neg Hx    Pancreatic cancer Neg Hx    Outpatient Medications Prior to Visit  Medication Sig Dispense Refill   AMBULATORY NON FORMULARY MEDICATION Apply 1 application topically daily. Hempvanna lotion     apixaban (ELIQUIS) 5 MG TABS tablet Take 1 tablet (5 mg total) by mouth 2 (two) times daily. 180 tablet 3   Calcium Carbonate-Vit D-Min (CALCIUM 1200) 1200-1000 MG-UNIT CHEW Chew 2 tablets by mouth daily. Slow release     dronedarone (MULTAQ) 400 MG tablet Take 1 tablet (400 mg total) by mouth 2 (two) times daily with a meal. 180 tablet 2   fexofenadine (ALLEGRA) 180 MG tablet Take 180 mg by mouth daily.     hypromellose (SYSTANE OVERNIGHT THERAPY) 0.3 % GEL ophthalmic ointment Place 1 application into both eyes at bedtime.     metroNIDAZOLE (METROCREAM) 0.75 % cream Apply 1 application on the skin twice a day 45 g 11   Misc Natural Products (OSTEO BI-FLEX ADV JOINT SHIELD PO) Take 2 tablets by mouth daily with lunch.      Multiple Vitamins-Minerals (CENTRUM SILVER ULTRA WOMENS PO) Take 1 tablet by mouth daily at 12 noon. 50+     Polyethyl Glycol-Propyl Glycol (SYSTANE) 0.4-0.3 % SOLN Place 1 drop into both eyes every morning.     simvastatin (ZOCOR) 5 MG tablet Take 1 tablet (5 mg total) by mouth daily. 90 tablet 3   omeprazole (PRILOSEC) 40 MG capsule Take 1 capsule (40 mg total) by mouth daily. 90 capsule 0   No facility-administered medications prior to visit.   Allergies  Allergen Reactions   Ivp Dye [Iodinated Contrast Media] Shortness Of Breath    Feels hot   Augmentin [Amoxicillin-Pot Clavulanate] Nausea And Vomiting    Did it involve swelling of the face/tongue/throat, SOB, or low BP? No Did it involve sudden or severe  rash/hives, skin peeling, or any reaction on the inside of your mouth or nose? No Did you need to seek medical attention at a hospital or doctor's office? No When did it last happen?      20+ years If all above answers are "NO", may proceed with cephalosporin use.    Demerol [Meperidine] Nausea And Vomiting   Monosodium Glutamate Other (See Comments)    "Heart racing and hot feeling."   Morphine And Codeine Nausea And Vomiting   Shellfish Allergy      ROS: A complete ROS was performed with pertinent positives/negatives noted in the HPI. The remainder of the ROS are negative.    Objective:   Today's Vitals   07/05/22 1444  BP: 139/62  Pulse: (!) 54  SpO2: 97%  Weight: 150 lb (68 kg)  Height: 5\' 3"  (1.6 m)    GENERAL: Well-appearing, in  NAD. Well nourished.  SKIN: Pink, warm and dry. No rash, lesion, ulceration, or ecchymoses.  Head: Normocephalic. NECK: Trachea midline. Full ROM w/o pain or tenderness. No lymphadenopathy.  RESPIRATORY: Chest wall symmetrical. Respirations even and non-labored. Breath sounds clear to auscultation bilaterally.  CARDIAC: S1, S2 present, regular rate and rhythm without murmur or gallops. Peripheral pulses 2+ bilaterally.  EXTREMITIES: Without clubbing, cyanosis, or edema.  NEUROLOGIC: No motor or sensory deficits. Steady, even gait. C2-C12 intact.  PSYCH/MENTAL STATUS: Alert, oriented x 3. Cooperative, appropriate mood and affect.   Health Maintenance Due  Topic Date Due   COVID-19 Vaccine (5 - 2023-24 season) 12/30/2021    No results found for any visits on 07/05/22.  The ASCVD Risk score (Arnett DK, et al., 2019) failed to calculate for the following reasons:   The patient has a prior MI or stroke diagnosis       Assessment & Plan:  Mixed hyperlipidemia Assessment & Plan: Will obtain lipid profile today with labs as last checked in 2022. Pt has upcoming labs w/ CBC and BMP so will not repeat. Simvastatin refilled. Repeat with MWV in 10  months.   Orders: -     Lipid panel  Gastroesophageal reflux disease with esophagitis without hemorrhage Assessment & Plan: Stable. Continue Omeprazole 40mg  daily. Avoid triggering foods.   Orders: -     Omeprazole; Take 1 capsule (40 mg total) by mouth daily.  Dispense: 90 capsule; Refill: 3  Vasomotor symptoms due to menopause Assessment & Plan: Discussed prescription and alternative therapies with patient. She did not want to try therapies at this time. Discussed symptom management.    Patient to reach out to office if new, worrisome, or unresolved symptoms arise or if no improvement in patient's condition. Patient verbalized understanding and is agreeable to treatment plan. All questions answered to patient's satisfaction.    Return in 9 months (on 04/19/2023) for MWV (Labs Fasting prior) .   Yolanda Manges, FNP

## 2022-07-05 NOTE — Assessment & Plan Note (Signed)
Will obtain lipid profile today with labs as last checked in 2022. Pt has upcoming labs w/ CBC and BMP so will not repeat. Simvastatin refilled. Repeat with MWV in 10 months.

## 2022-07-05 NOTE — Telephone Encounter (Signed)
Pt has been scheduled for an appt with Jon Gills today 7/1.

## 2022-07-05 NOTE — Assessment & Plan Note (Signed)
Discussed prescription and alternative therapies with patient. She did not want to try therapies at this time. Discussed symptom management.

## 2022-07-05 NOTE — Telephone Encounter (Signed)
Please call the pt regarding Medication that she is now out of --

## 2022-07-06 ENCOUNTER — Other Ambulatory Visit (HOSPITAL_BASED_OUTPATIENT_CLINIC_OR_DEPARTMENT_OTHER): Payer: Self-pay | Admitting: *Deleted

## 2022-07-06 ENCOUNTER — Other Ambulatory Visit (HOSPITAL_BASED_OUTPATIENT_CLINIC_OR_DEPARTMENT_OTHER): Payer: Self-pay | Admitting: Family Medicine

## 2022-07-06 DIAGNOSIS — E782 Mixed hyperlipidemia: Secondary | ICD-10-CM

## 2022-07-06 LAB — LIPID PANEL
Chol/HDL Ratio: 3.1 ratio (ref 0.0–4.4)
Cholesterol, Total: 200 mg/dL — ABNORMAL HIGH (ref 100–199)
HDL: 64 mg/dL (ref >39)
LDL Chol Calc (NIH): 98 mg/dL (ref 0–99)
Triglycerides: 224 mg/dL — ABNORMAL HIGH (ref 0–149)
VLDL Cholesterol Cal: 38 mg/dL (ref 5–40)

## 2022-07-06 NOTE — Progress Notes (Signed)
Please let pt know that cholesterol and triglycerides are elevated from previous results 2 years ago. Increasing her statin therapy and starting an OTC Omega 3 Fish oil daily can help to reduce this along with diet and exercise. If she is agreeable to increase her statin, please let me know.  Irving Burton, Can we see if we can add on an A1c to her labs?

## 2022-07-07 ENCOUNTER — Other Ambulatory Visit (HOSPITAL_BASED_OUTPATIENT_CLINIC_OR_DEPARTMENT_OTHER): Payer: Self-pay | Admitting: *Deleted

## 2022-07-07 ENCOUNTER — Other Ambulatory Visit (HOSPITAL_BASED_OUTPATIENT_CLINIC_OR_DEPARTMENT_OTHER): Payer: Self-pay

## 2022-07-07 MED ORDER — SIMVASTATIN 10 MG PO TABS
10.0000 mg | ORAL_TABLET | Freq: Every day | ORAL | 3 refills | Status: DC
  Filled 2022-07-07: qty 90, 90d supply, fill #0
  Filled 2022-10-18: qty 90, 90d supply, fill #1
  Filled 2022-12-30 – 2023-01-06 (×2): qty 90, 90d supply, fill #2
  Filled 2023-04-13: qty 90, 90d supply, fill #3

## 2022-07-22 ENCOUNTER — Ambulatory Visit (HOSPITAL_BASED_OUTPATIENT_CLINIC_OR_DEPARTMENT_OTHER)
Admission: RE | Admit: 2022-07-22 | Discharge: 2022-07-22 | Disposition: A | Discharge status: Home / Self Care | Payer: Medicare PPO | Source: Ambulatory Visit | Attending: Family Medicine | Admitting: Family Medicine

## 2022-07-22 DIAGNOSIS — Z1231 Encounter for screening mammogram for malignant neoplasm of breast: Secondary | ICD-10-CM | POA: Diagnosis not present

## 2022-07-28 ENCOUNTER — Telehealth: Payer: Self-pay | Admitting: Pulmonary Disease

## 2022-07-28 NOTE — Telephone Encounter (Signed)
Pt. On recall list called pt. But was unaware after she had her surgery that she needs to have F/u's with the office please advise if you would still want me to sched. her

## 2022-07-29 ENCOUNTER — Other Ambulatory Visit (HOSPITAL_BASED_OUTPATIENT_CLINIC_OR_DEPARTMENT_OTHER): Payer: Self-pay

## 2022-07-29 NOTE — Telephone Encounter (Signed)
Called pt. Back let her know what NP Groce said and she wants to wait to see the response from Dr.Icard is due to she saying she didn't think she needed to come in for F/u's anymore

## 2022-08-02 NOTE — Telephone Encounter (Signed)
Called pt. Back to let her know

## 2022-08-18 ENCOUNTER — Ambulatory Visit: Payer: Medicare PPO | Attending: Family Medicine

## 2022-08-18 DIAGNOSIS — I480 Paroxysmal atrial fibrillation: Secondary | ICD-10-CM

## 2022-08-18 DIAGNOSIS — R911 Solitary pulmonary nodule: Secondary | ICD-10-CM

## 2022-08-19 LAB — CBC WITH DIFFERENTIAL/PLATELET
Basophils Absolute: 0 10*3/uL (ref 0.0–0.2)
Basos: 1 %
EOS (ABSOLUTE): 0.2 10*3/uL (ref 0.0–0.4)
Eos: 3 %
Hematocrit: 43 % (ref 34.0–46.6)
Hemoglobin: 14.7 g/dL (ref 11.1–15.9)
Immature Grans (Abs): 0 10*3/uL (ref 0.0–0.1)
Immature Granulocytes: 1 %
Lymphocytes Absolute: 1.9 10*3/uL (ref 0.7–3.1)
Lymphs: 30 %
MCH: 30.8 pg (ref 26.6–33.0)
MCHC: 34.2 g/dL (ref 31.5–35.7)
MCV: 90 fL (ref 79–97)
Monocytes Absolute: 0.6 10*3/uL (ref 0.1–0.9)
Monocytes: 10 %
Neutrophils Absolute: 3.6 10*3/uL (ref 1.4–7.0)
Neutrophils: 55 %
Platelets: 252 10*3/uL (ref 150–450)
RBC: 4.77 x10E6/uL (ref 3.77–5.28)
RDW: 12.2 % (ref 11.7–15.4)
WBC: 6.4 10*3/uL (ref 3.4–10.8)

## 2022-08-19 LAB — BASIC METABOLIC PANEL
BUN/Creatinine Ratio: 13 (ref 12–28)
BUN: 14 mg/dL (ref 8–27)
CO2: 25 mmol/L (ref 20–29)
Calcium: 9.6 mg/dL (ref 8.7–10.3)
Chloride: 104 mmol/L (ref 96–106)
Creatinine, Ser: 1.04 mg/dL — ABNORMAL HIGH (ref 0.57–1.00)
Glucose: 97 mg/dL (ref 70–99)
Potassium: 4.5 mmol/L (ref 3.5–5.2)
Sodium: 142 mmol/L (ref 134–144)
eGFR: 55 mL/min/{1.73_m2} — ABNORMAL LOW (ref >59)

## 2022-08-20 ENCOUNTER — Other Ambulatory Visit (HOSPITAL_BASED_OUTPATIENT_CLINIC_OR_DEPARTMENT_OTHER): Payer: Self-pay

## 2022-08-20 ENCOUNTER — Telehealth: Payer: Self-pay

## 2022-08-24 ENCOUNTER — Other Ambulatory Visit (HOSPITAL_COMMUNITY): Payer: Medicare PPO

## 2022-08-30 NOTE — Anesthesia Preprocedure Evaluation (Signed)
Anesthesia Evaluation  Patient identified by MRN, date of birth, ID band Patient awake    Reviewed: Allergy & Precautions, NPO status , Patient's Chart, lab work & pertinent test results  Airway Mallampati: III  TM Distance: >3 FB Neck ROM: Full  Mouth opening: Limited Mouth Opening  Dental  (+) Dental Advisory Given, Teeth Intact   Pulmonary neg pulmonary ROS   Pulmonary exam normal        Cardiovascular + dysrhythmias Atrial Fibrillation  Rhythm:Irregular Rate:Normal  11/2021 echo  1. Left ventricular ejection fraction, by estimation, is 60 to 65%. The  left ventricle has normal function. The left ventricle has no regional  wall motion abnormalities. Left ventricular diastolic parameters are  indeterminate.   2. Right ventricular systolic function is normal. The right ventricular  size is normal.   3. The mitral valve is normal in structure. Mild to moderate mitral valve  regurgitation. No evidence of mitral stenosis.   4. Tricuspid valve regurgitation is mild to moderate.   5. The aortic valve is normal in structure. Aortic valve regurgitation is  not visualized. No aortic stenosis is present.   6. The inferior vena cava is normal in size with greater than 50%  respiratory variability, suggesting right atrial pressure of 3 mmHg.      Neuro/Psych  Neuromuscular disease    GI/Hepatic Neg liver ROS, hiatal hernia, PUD,GERD  Medicated,,  Endo/Other  negative endocrine ROS    Renal/GU negative Renal ROS     Musculoskeletal  (+) Arthritis ,    Abdominal   Peds  Hematology negative hematology ROS (+)   Anesthesia Other Findings All: MS04, Meperidine, codeine, augmentin  Reproductive/Obstetrics                             Anesthesia Physical Anesthesia Plan  ASA: 3  Anesthesia Plan: General   Post-op Pain Management: Ofirmev IV (intra-op)*   Induction: Intravenous  PONV Risk Score  and Plan: 3 and Dexamethasone, Ondansetron and Treatment may vary due to age or medical condition  Airway Management Planned: Oral ETT  Additional Equipment: None  Intra-op Plan:   Post-operative Plan: Extubation in OR  Informed Consent: I have reviewed the patients History and Physical, chart, labs and discussed the procedure including the risks, benefits and alternatives for the proposed anesthesia with the patient or authorized representative who has indicated his/her understanding and acceptance.     Dental advisory given  Plan Discussed with: CRNA  Anesthesia Plan Comments:        Anesthesia Quick Evaluation

## 2022-08-30 NOTE — Pre-Procedure Instructions (Signed)
Attempted to call patient regarding procedure instructions for tomorrow.  Left voicemail on the following items: Arrival time 5:15 Nothing to eat or drink after midnight No meds AM of procedure Responsible person to drive you home and stay with you for 24 hrs  Have you missed any doses of anti-coagulant Eliquis- should be taken twice a day, if you have missed any doses please let us know.  Don't take dose in the morning.

## 2022-08-31 ENCOUNTER — Ambulatory Visit (HOSPITAL_BASED_OUTPATIENT_CLINIC_OR_DEPARTMENT_OTHER): Payer: Medicare PPO | Admitting: Certified Registered"

## 2022-08-31 ENCOUNTER — Ambulatory Visit (HOSPITAL_COMMUNITY): Payer: Medicare PPO | Admitting: Certified Registered"

## 2022-08-31 ENCOUNTER — Ambulatory Visit (HOSPITAL_COMMUNITY)
Admission: RE | Admit: 2022-08-31 | Discharge: 2022-08-31 | Disposition: A | Discharge status: Home / Self Care | Payer: Medicare PPO | Attending: Cardiology | Admitting: Cardiology

## 2022-08-31 ENCOUNTER — Other Ambulatory Visit (HOSPITAL_COMMUNITY): Payer: Self-pay

## 2022-08-31 ENCOUNTER — Ambulatory Visit (HOSPITAL_COMMUNITY)
Admission: RE | Disposition: A | Discharge status: Home / Self Care | Payer: Self-pay | Source: Home / Self Care | Attending: Cardiology

## 2022-08-31 DIAGNOSIS — Z7901 Long term (current) use of anticoagulants: Secondary | ICD-10-CM | POA: Diagnosis not present

## 2022-08-31 DIAGNOSIS — I480 Paroxysmal atrial fibrillation: Secondary | ICD-10-CM | POA: Insufficient documentation

## 2022-08-31 DIAGNOSIS — E785 Hyperlipidemia, unspecified: Secondary | ICD-10-CM | POA: Diagnosis not present

## 2022-08-31 DIAGNOSIS — K2100 Gastro-esophageal reflux disease with esophagitis, without bleeding: Secondary | ICD-10-CM

## 2022-08-31 DIAGNOSIS — I499 Cardiac arrhythmia, unspecified: Secondary | ICD-10-CM | POA: Diagnosis not present

## 2022-08-31 DIAGNOSIS — R911 Solitary pulmonary nodule: Secondary | ICD-10-CM | POA: Diagnosis not present

## 2022-08-31 DIAGNOSIS — I4891 Unspecified atrial fibrillation: Secondary | ICD-10-CM

## 2022-08-31 HISTORY — PX: ATRIAL FIBRILLATION ABLATION: EP1191

## 2022-08-31 LAB — POCT ACTIVATED CLOTTING TIME: Activated Clotting Time: 287 seconds

## 2022-08-31 SURGERY — ATRIAL FIBRILLATION ABLATION
Anesthesia: General

## 2022-08-31 MED ORDER — PROTAMINE SULFATE 10 MG/ML IV SOLN
INTRAVENOUS | Status: DC | PRN
Start: 2022-08-31 — End: 2022-08-31
  Administered 2022-08-31: 30 mg via INTRAVENOUS

## 2022-08-31 MED ORDER — ACETAMINOPHEN 10 MG/ML IV SOLN
INTRAVENOUS | Status: AC
  Filled 2022-08-31: qty 100

## 2022-08-31 MED ORDER — SODIUM CHLORIDE 0.9 % IV SOLN: 250.0000 mL | INTRAVENOUS | Status: DC | PRN

## 2022-08-31 MED ORDER — APIXABAN 5 MG PO TABS
5.0000 mg | ORAL_TABLET | Freq: Two times a day (BID) | ORAL | Status: DC
  Administered 2022-08-31: 5 mg via ORAL
  Filled 2022-08-31 (×2): qty 1

## 2022-08-31 MED ORDER — COLCHICINE 0.6 MG PO TABS
0.6000 mg | ORAL_TABLET | Freq: Two times a day (BID) | ORAL | 0 refills | Status: DC
  Filled 2022-08-31: qty 10, 5d supply, fill #0

## 2022-08-31 MED ORDER — FENTANYL CITRATE (PF) 250 MCG/5ML IJ SOLN
INTRAMUSCULAR | Status: DC | PRN
  Administered 2022-08-31: 50 ug via INTRAVENOUS

## 2022-08-31 MED ORDER — ATROPINE SULFATE 1 MG/ML IV SOLN
INTRAVENOUS | Status: DC | PRN
  Administered 2022-08-31: .5 mg via INTRAVENOUS

## 2022-08-31 MED ORDER — ONDANSETRON HCL 4 MG/2ML IJ SOLN
INTRAMUSCULAR | Status: DC | PRN
  Administered 2022-08-31: 4 mg via INTRAVENOUS

## 2022-08-31 MED ORDER — ACETAMINOPHEN 325 MG PO TABS: 650.0000 mg | ORAL_TABLET | ORAL | Status: DC | PRN

## 2022-08-31 MED ORDER — SODIUM CHLORIDE 0.9 % IV SOLN: INTRAVENOUS | Status: DC

## 2022-08-31 MED ORDER — HEPARIN SODIUM (PORCINE) 1000 UNIT/ML IJ SOLN
INTRAMUSCULAR | Status: DC | PRN
  Administered 2022-08-31: 4000 [IU] via INTRAVENOUS
  Administered 2022-08-31: 11000 [IU] via INTRAVENOUS

## 2022-08-31 MED ORDER — SODIUM CHLORIDE 0.9% FLUSH: 3.0000 mL | Freq: Two times a day (BID) | INTRAVENOUS | Status: DC

## 2022-08-31 MED ORDER — LIDOCAINE 2% (20 MG/ML) 5 ML SYRINGE
INTRAMUSCULAR | Status: DC | PRN
  Administered 2022-08-31: 100 mg via INTRAVENOUS

## 2022-08-31 MED ORDER — SODIUM CHLORIDE 0.9% FLUSH: 3.0000 mL | INTRAVENOUS | Status: DC | PRN

## 2022-08-31 MED ORDER — FENTANYL CITRATE (PF) 100 MCG/2ML IJ SOLN
INTRAMUSCULAR | Status: AC
  Filled 2022-08-31: qty 2

## 2022-08-31 MED ORDER — PHENYLEPHRINE 80 MCG/ML (10ML) SYRINGE FOR IV PUSH (FOR BLOOD PRESSURE SUPPORT)
PREFILLED_SYRINGE | INTRAVENOUS | Status: DC | PRN
  Administered 2022-08-31 (×2): 80 ug via INTRAVENOUS

## 2022-08-31 MED ORDER — HEPARIN (PORCINE) IN NACL 1000-0.9 UT/500ML-% IV SOLN
INTRAVENOUS | Status: DC | PRN
  Administered 2022-08-31 (×4): 500 mL

## 2022-08-31 MED ORDER — ROCURONIUM BROMIDE 10 MG/ML (PF) SYRINGE
PREFILLED_SYRINGE | INTRAVENOUS | Status: DC | PRN
  Administered 2022-08-31: 50 mg via INTRAVENOUS

## 2022-08-31 MED ORDER — SUGAMMADEX SODIUM 200 MG/2ML IV SOLN
INTRAVENOUS | Status: DC | PRN
  Administered 2022-08-31: 150 mg via INTRAVENOUS

## 2022-08-31 MED ORDER — DEXAMETHASONE SODIUM PHOSPHATE 10 MG/ML IJ SOLN
INTRAMUSCULAR | Status: DC | PRN
  Administered 2022-08-31: 5 mg via INTRAVENOUS

## 2022-08-31 MED ORDER — PANTOPRAZOLE SODIUM 40 MG PO TBEC
40.0000 mg | DELAYED_RELEASE_TABLET | Freq: Every day | ORAL | 0 refills | Status: DC
Start: 2022-08-31 — End: 2022-10-18
  Filled 2022-08-31: qty 45, 45d supply, fill #0

## 2022-08-31 MED ORDER — PHENYLEPHRINE HCL-NACL 20-0.9 MG/250ML-% IV SOLN
INTRAVENOUS | Status: DC | PRN
  Administered 2022-08-31: 25 ug/min via INTRAVENOUS

## 2022-08-31 MED ORDER — COLCHICINE 0.6 MG PO TABS
0.6000 mg | ORAL_TABLET | Freq: Two times a day (BID) | ORAL | Status: DC
  Administered 2022-08-31: 0.6 mg via ORAL
  Filled 2022-08-31 (×2): qty 1

## 2022-08-31 MED ORDER — PANTOPRAZOLE SODIUM 40 MG PO TBEC
40.0000 mg | DELAYED_RELEASE_TABLET | Freq: Every day | ORAL | Status: DC
  Administered 2022-08-31: 40 mg via ORAL
  Filled 2022-08-31 (×2): qty 1

## 2022-08-31 MED ORDER — OMEPRAZOLE 40 MG PO CPDR
40.0000 mg | DELAYED_RELEASE_CAPSULE | Freq: Every day | ORAL | Status: DC
Start: 2022-08-31 — End: 2022-09-28

## 2022-08-31 MED ORDER — ACETAMINOPHEN 10 MG/ML IV SOLN
INTRAVENOUS | Status: DC | PRN
Start: 2022-08-31 — End: 2022-08-31
  Administered 2022-08-31: 1000 mg via INTRAVENOUS

## 2022-08-31 MED ORDER — PROPOFOL 10 MG/ML IV BOLUS
INTRAVENOUS | Status: DC | PRN
Start: 2022-08-31 — End: 2022-08-31
  Administered 2022-08-31: 130 mg via INTRAVENOUS

## 2022-08-31 MED ORDER — ONDANSETRON HCL 4 MG/2ML IJ SOLN: 4.0000 mg | Freq: Four times a day (QID) | INTRAMUSCULAR | Status: DC | PRN

## 2022-08-31 SURGICAL SUPPLY — 22 items
BAG SNAP BAND KOVER 36X36 (MISCELLANEOUS) IMPLANT
CABLE PFA RX CATH CONN (CABLE) IMPLANT
CATH 8FR REPROCESSED SOUNDSTAR (CATHETERS) ×1 IMPLANT
CATH 8FR SOUNDSTAR REPROCESSED (CATHETERS) IMPLANT
CATH FARAWAVE ABLATION 31 (CATHETERS) IMPLANT
CATH OCTARAY 2.0 F 3-3-3-3-3 (CATHETERS) IMPLANT
CATH WEB BI DIR CSDF CRV REPRO (CATHETERS) IMPLANT
CLOSURE PERCLOSE PROSTYLE (VASCULAR PRODUCTS) IMPLANT
COVER SWIFTLINK CONNECTOR (BAG) ×1 IMPLANT
DILATOR VESSEL 38 20CM 12FR (INTRODUCER) IMPLANT
DILATOR VESSEL 38 20CM 16FR (INTRODUCER) IMPLANT
GUIDEWIRE INQWIRE 1.5J.035X260 (WIRE) IMPLANT
INQWIRE 1.5J .035X260CM (WIRE) ×1
PACK EP LATEX FREE (CUSTOM PROCEDURE TRAY) ×1
PACK EP LF (CUSTOM PROCEDURE TRAY) ×1 IMPLANT
PAD DEFIB RADIO PHYSIO CONN (PAD) ×1 IMPLANT
PATCH CARTO3 (PAD) IMPLANT
SHEATH FARADRIVE STEERABLE (SHEATH) IMPLANT
SHEATH PINNACLE 8F 10CM (SHEATH) IMPLANT
SHEATH PINNACLE 9F 10CM (SHEATH) IMPLANT
SHEATH PROBE COVER 6X72 (BAG) IMPLANT
SHEATH WIRE KIT BAYLIS SL1 (KITS) IMPLANT

## 2022-08-31 NOTE — Anesthesia Postprocedure Evaluation (Signed)
Anesthesia Post Note  Patient: Brittany Harper  Procedure(s) Performed: ATRIAL FIBRILLATION ABLATION     Patient location during evaluation: PACU Anesthesia Type: General Level of consciousness: awake and alert Pain management: pain level controlled Vital Signs Assessment: post-procedure vital signs reviewed and stable Respiratory status: spontaneous breathing, nonlabored ventilation, respiratory function stable and patient connected to nasal cannula oxygen Cardiovascular status: blood pressure returned to baseline and stable Postop Assessment: no apparent nausea or vomiting Anesthetic complications: no   No notable events documented.  Last Vitals:  Vitals:   08/31/22 1230 08/31/22 1300  BP: 124/73 (!) 150/68  Pulse: 63 70  Resp: 15 20  Temp:    SpO2: 95% 97%    Last Pain:  Vitals:   08/31/22 1102  TempSrc:   PainSc: 0-No pain                 Trevor Iha

## 2022-08-31 NOTE — Transfer of Care (Signed)
Immediate Anesthesia Transfer of Care Note  Patient: Brittany Harper  Procedure(s) Performed: ATRIAL FIBRILLATION ABLATION  Patient Location: cath lab Anesthesia Type:General  Level of Consciousness: awake, alert , and oriented  Airway & Oxygen Therapy: Patient Spontanous Breathing  Post-op Assessment: Report given to RN and Post -op Vital signs reviewed and stable  Post vital signs: Reviewed and stable  Last Vitals:  Vitals Value Taken Time  BP 135/74 08/31/22 1008  Temp    Pulse 61 08/31/22 1010  Resp 15 08/31/22 1010  SpO2 98 % 08/31/22 1010  Vitals shown include unfiled device data.  Last Pain:  Vitals:   08/31/22 0623  TempSrc:   PainSc: 0-No pain         Complications: No notable events documented.

## 2022-08-31 NOTE — Discharge Instructions (Signed)

## 2022-08-31 NOTE — Progress Notes (Signed)
Dr Lalla Brothers in and stitch removed and pressure held x due to ooze noted and no further oozing noted; pressure dressing placed

## 2022-08-31 NOTE — Progress Notes (Signed)
Patient walked to the bathroom, no difficulties. Groin sites level 0, clean dry and intact.

## 2022-08-31 NOTE — H&P (Signed)
Electrophysiology Office Follow up Visit Note:     Date:  08/31/2022    ID:  Brittany Harper, DOB 25-Mar-1945, MRN [id number]   PCP:  de [place], Buren Kos, MD        [facility] Cardiologist:  Donato Schultz, MD  [facility] Electrophysiologist:  Lanier Prude, MD      Interval History:     Brittany Harper is a 77 y.o. female who presents for a follow up visit.    I previously saw her for her AF. Pre op CT showed a lung mass that has ultimately been biopsied.  She was previously on multaq for rhythm control. We also discussed LAAO during our previous appointments but this was also cancelled because of the lung mass.    Today, she states she is feeling good and has recovered well aside from some mild pruritus along one surgical scar. She is treating this with a topical cream. She has a follow-up appointment with Dr. Cliffton Asters this Friday.   Every week she monitors her heart rhythm at home and denies noticing any arrhythmias.   She denies any palpitations, chest pain, shortness of breath, or peripheral edema. No lightheadedness, headaches, syncope, orthopnea, or PND.   Presents for PVI today.;   Objective Past medical, surgical, social and family history were reviewed.   ROS:   Please see the history of present illness.    All other systems reviewed and are negative.   EKGs/Labs/Other Studies Reviewed:     The following studies were reviewed today:   EKG:  The ekg ordered today demonstrates sinus bradycardia     Physical Exam:     VS:  BP 160/81   Pulse 48   Ht 5\' 3"  (1.6 m)   Wt 153 lb 3.2 oz (69.5 kg)   SpO2 96%   BMI 27.14 kg/m         Wt Readings from Last 3 Encounters:  05/18/22 153 lb 3.2 oz (69.5 kg)  04/16/22 155 lb (70.3 kg)  04/13/22 154 lb (69.9 kg)      GEN:  Well nourished, well developed in no acute distress CARDIAC: RRR, no murmurs, rubs, gallops RESPIRATORY:  Clear to auscultation without rales, wheezing or rhonchi           Assessment ASSESSMENT:     1. PAF (paroxysmal atrial fibrillation) (HCC)   2. Lung nodule     PLAN:     In order of problems listed above:     #pAF On eliquis for stroke ppx. Still desires a rhythm control strategy that avoids long term exposure to antiarrhythmic drugs which is veyr reasonable. I have discussed catheter ablation and she would like to proceed.   Discussed treatment options today for AF including antiarrhythmic drug therapy and ablation. Discussed risks, recovery and likelihood of success with each treatment strategy. Risk, benefits, and alternatives to EP study and ablation for afib were discussed. These risks include but are not limited to stroke, bleeding, vascular damage, tamponade, perforation, damage to the esophagus, lungs, phrenic nerve and other structures, pulmonary vein stenosis, worsening renal function, coronary vasospasm and death.  Discussed potential need for repeat ablation procedures and antiarrhythmic drugs after an initial ablation. The patient understands these risk and wishes to proceed.  We will therefore proceed with catheter ablation at the next available time.  Carto, ICE, anesthesia are requested for the procedure.  Will also obtain CT PV protocol prior to the procedure to exclude LAA thrombus and further evaluate atrial  anatomy.  Presents for PVI today. Procedure reviewed.   Signed, Steffanie Dunn, MD, [facility], [facility] 08/31/2022 Electrophysiology [facility]

## 2022-08-31 NOTE — Anesthesia Procedure Notes (Signed)
Procedure Name: Intubation Date/Time: 08/31/2022 7:41 AM  Performed by: Alwyn Ren, CRNAPre-anesthesia Checklist: Patient identified, Emergency Drugs available, Suction available and Patient being monitored Patient Re-evaluated:Patient Re-evaluated prior to induction Oxygen Delivery Method: Circle system utilized Preoxygenation: Pre-oxygenation with 100% oxygen Induction Type: IV induction Ventilation: Mask ventilation without difficulty Laryngoscope Size: Miller and 2 Grade View: Grade II Tube type: Oral Tube size: 7.0 mm Number of attempts: 2 Airway Equipment and Method: Stylet and Oral airway Placement Confirmation: ETT inserted through vocal cords under direct vision, positive ETCO2 and breath sounds checked- equal and bilateral Secured at: 21 cm Tube secured with: Tape Dental Injury: Teeth and Oropharynx as per pre-operative assessment

## 2022-09-01 ENCOUNTER — Encounter (HOSPITAL_COMMUNITY): Payer: Self-pay | Admitting: Cardiology

## 2022-09-01 MED FILL — Fentanyl Citrate Preservative Free (PF) Inj 100 MCG/2ML: INTRAMUSCULAR | Qty: 1 | Status: AC

## 2022-09-03 ENCOUNTER — Telehealth: Payer: Self-pay | Admitting: Cardiology

## 2022-09-03 NOTE — Telephone Encounter (Signed)
Spoke with patient who reports that she has been having diarrhea for the past 24 hours. She was prescribed colchicine and pantoprazole after her ablation. I advised her that she can go ahead and stop the colchicine to see if symptoms resolve. She will let us know if she has any chest pain/pressure. Advised her to use over the counter imodium as needed as well. She does report having some worsening reflux symptoms. She was previously on omeprazole and switched to pantoprazole after her ablation. She is wondering if she should switch back to omeprazole. Advised her to keep trying the pantoprazole and let us know if symptoms do not resolve.

## 2022-09-03 NOTE — Telephone Encounter (Signed)
Pt c/o medication issue:  1. Name of Medication:   colchicine 0.6 MG tablet  pantoprazole (PROTONIX) 40 MG tablet   2. How are you currently taking this medication (dosage and times per day)?   As prescribed since surgery  3. Are you having a reaction (difficulty breathing--STAT)?   Continuous diarrhea for last 24 hours.  4. What is your medication issue?   Patient requested call back from April regarding questions from ablation and medication reaction.

## 2022-09-07 ENCOUNTER — Other Ambulatory Visit (HOSPITAL_BASED_OUTPATIENT_CLINIC_OR_DEPARTMENT_OTHER): Payer: Self-pay

## 2022-09-07 MED ORDER — FLUAD 0.5 ML IM SUSY
0.5000 mL | PREFILLED_SYRINGE | Freq: Once | INTRAMUSCULAR | 0 refills | Status: AC
  Filled 2022-09-07: qty 0.5, 1d supply, fill #0

## 2022-09-15 ENCOUNTER — Telehealth: Payer: Self-pay

## 2022-09-15 NOTE — Telephone Encounter (Signed)
Called to discuss Watchman planning.   Left message to call back.

## 2022-09-15 NOTE — Telephone Encounter (Signed)
The patient reports she does not wish to schedule LAAO at this time.  She has been through a lot this year with open surgery and an ablation.  She will keep her appointment with Dr. Lalla Brothers in December and if she wishes to proceed with Watchman at that time, she will be contacted. She was grateful for assistance and agreed with plan.

## 2022-09-28 ENCOUNTER — Ambulatory Visit (HOSPITAL_COMMUNITY)
Admit: 2022-09-28 | Discharge: 2022-09-28 | Disposition: A | Discharge status: Home / Self Care | Payer: Medicare PPO | Source: Ambulatory Visit | Attending: Physician Assistant | Admitting: Physician Assistant

## 2022-09-28 ENCOUNTER — Encounter (HOSPITAL_COMMUNITY): Payer: Self-pay | Admitting: Physician Assistant

## 2022-09-28 VITALS — BP 118/80 | HR 48 | Ht 63.5 in | Wt 147.4 lb

## 2022-09-28 DIAGNOSIS — D6869 Other thrombophilia: Secondary | ICD-10-CM | POA: Diagnosis not present

## 2022-09-28 DIAGNOSIS — I480 Paroxysmal atrial fibrillation: Secondary | ICD-10-CM | POA: Insufficient documentation

## 2022-09-28 DIAGNOSIS — Z5181 Encounter for therapeutic drug level monitoring: Secondary | ICD-10-CM

## 2022-09-28 DIAGNOSIS — E785 Hyperlipidemia, unspecified: Secondary | ICD-10-CM | POA: Diagnosis present

## 2022-09-28 DIAGNOSIS — Z7901 Long term (current) use of anticoagulants: Secondary | ICD-10-CM | POA: Insufficient documentation

## 2022-09-28 DIAGNOSIS — Z79899 Other long term (current) drug therapy: Secondary | ICD-10-CM

## 2022-09-28 DIAGNOSIS — R9431 Abnormal electrocardiogram [ECG] [EKG]: Secondary | ICD-10-CM | POA: Insufficient documentation

## 2022-09-28 DIAGNOSIS — I4891 Unspecified atrial fibrillation: Secondary | ICD-10-CM | POA: Diagnosis present

## 2022-09-28 NOTE — Progress Notes (Signed)
Primary Care Physician: de [place], Buren Kos, MD Primary Cardiologist: Donato Schultz, MD Electrophysiologist: Lanier Prude, MD  Referring Physician: Dr Beola Cord is a 77 y.o. female with a history of HLD and atrial fibrillation who presents for follow up in the [facility]. Patient was initially treated with Multaq. She was seen by Dr Lalla Brothers and underwent afib ablation on 08/31/22. Patient is on Eliquis for a CHADS2VASC score of 3.  On follow up today, patient reports that she has done well since her ablation. She has not had any interim symptoms of afib. She denies chest pain, swallowing pain, or groin issues.   Today, she denies symptoms of palpitations, chest pain, shortness of breath, orthopnea, PND, lower extremity edema, dizziness, presyncope, syncope, snoring, daytime somnolence, bleeding, or neurologic sequela. The patient is tolerating medications without difficulties and is otherwise without complaint today.    Atrial Fibrillation Risk Factors:  she does not have symptoms or diagnosis of sleep apnea. she does not have a history of rheumatic fever.   Atrial Fibrillation Management history:  Previous antiarrhythmic drugs: Multaq Previous cardioversions: none Previous ablations: 08/31/22 Anticoagulation history: Eliquis  ROS- All systems are reviewed and negative except as per the HPI above.  Past Medical History:  Diagnosis Date   Allergy    Arthritis    Asthma    Cancer (HCC)    basal cell   Dysrhythmia    A. fib   Gastric ulcer without hemorrhage or perforation    GERD (gastroesophageal reflux disease)    Hiatal hernia with GERD and esophagitis 10/16/2018   Hx of skin cancer, basal cell    Hyperlipidemia    Memory change 04/05/2016    Current Outpatient Medications  Medication Sig Dispense Refill   acetaminophen (TYLENOL) 500 MG tablet Take 500 mg by mouth every 6 (six) hours as needed for moderate pain.      AMBULATORY NON FORMULARY MEDICATION Apply 1 application  topically in the morning and at bedtime. Hempvanna lotion     apixaban (ELIQUIS) 5 MG TABS tablet Take 1 tablet (5 mg total) by mouth 2 (two) times daily. 180 tablet 3   Calcium Carb-Cholecalciferol (CALCIUM 600 + D PO) Take 2 tablets by mouth daily.     dronedarone (MULTAQ) 400 MG tablet Take 1 tablet (400 mg total) by mouth 2 (two) times daily with a meal. 180 tablet 2   fexofenadine (ALLEGRA) 180 MG tablet Take 180 mg by mouth daily.     hypromellose (SYSTANE OVERNIGHT THERAPY) 0.3 % GEL ophthalmic ointment Place 1 application into both eyes at bedtime.     metroNIDAZOLE (METROCREAM) 0.75 % cream Apply 1 application on the skin twice a day 45 g 11   Misc Natural Products (OSTEO BI-FLEX ADV JOINT SHIELD PO) Take 2 tablets by mouth daily with lunch.      Multiple Vitamins-Minerals (CENTRUM SILVER ULTRA WOMENS PO) Take 1 tablet by mouth daily at 12 noon. 50+     pantoprazole (PROTONIX) 40 MG tablet Take 1 tablet (40 mg total) by mouth daily. 45 tablet 0   Polyethyl Glycol-Propyl Glycol (SYSTANE) 0.4-0.3 % SOLN Place 1 drop into both eyes every morning.     simvastatin (ZOCOR) 10 MG tablet Take 1 tablet (10 mg total) by mouth at bedtime. 90 tablet 3   No current facility-administered medications for this encounter.    Physical Exam: BP 118/80   Pulse (!) 48   Ht 5' 3.5" (1.613  m)   Wt 66.9 kg   BMI 25.70 kg/m   GEN: Well nourished, well developed in no acute distress NECK: No JVD; No carotid bruits CARDIAC: Regular rate and rhythm, no murmurs, rubs, gallops RESPIRATORY:  Clear to auscultation without rales, wheezing or rhonchi  ABDOMEN: Soft, non-tender, non-distended EXTREMITIES:  No edema; No deformity   Wt Readings from Last 3 Encounters:  09/28/22 66.9 kg  08/31/22 68 kg  07/05/22 68 kg     EKG today demonstrates  SB Vent. rate 48 BPM PR interval 138 ms QRS duration 90 ms QT/QTcB 508/453 ms  Echo 11/23/21  demonstrated   1. Left ventricular ejection fraction, by estimation, is 60 to 65%. The  left ventricle has normal function. The left ventricle has no regional  wall motion abnormalities. Left ventricular diastolic parameters are  indeterminate.   2. Right ventricular systolic function is normal. The right ventricular  size is normal.   3. The mitral valve is normal in structure. Mild to moderate mitral valve  regurgitation. No evidence of mitral stenosis.   4. Tricuspid valve regurgitation is mild to moderate.   5. The aortic valve is normal in structure. Aortic valve regurgitation is  not visualized. No aortic stenosis is present.   6. The inferior vena cava is normal in size with greater than 50%  respiratory variability, suggesting right atrial pressure of 3 mmHg.    CHA2DS2-VASc Score = 3  [name] score is based upon: CHF History: 0 HTN History: 0 Diabetes History: 0 Stroke History: 0 Vascular Disease History: 0 Age Score: 2 Gender Score: 1       ASSESSMENT AND PLAN: Paroxysmal Atrial Fibrillation (ICD10:  I48.0) [name] CHA2DS2-VASc score is 3, indicating a 3.2% annual risk of stroke.   S/p afib ablation 08/31/22 Patient appears to be maintaining SR Continue Multaq 400 mg BID for now Continue Eliquis 5 mg BID with no missed doses for 3 months post ablation.   Secondary Hypercoagulable State (ICD10:  D68.69) The patient is at significant risk for stroke/thromboembolism based upon her CHA2DS2-VASc Score of 3.  Continue Apixaban (Eliquis).    Follow up with Dr Lalla Brothers as scheduled.       Jorja Loa PA-C Afib Clinic [facility] [address] [place], [place] [address] [phone]

## 2022-10-01 DIAGNOSIS — L821 Other seborrheic keratosis: Secondary | ICD-10-CM | POA: Diagnosis not present

## 2022-10-01 DIAGNOSIS — L570 Actinic keratosis: Secondary | ICD-10-CM | POA: Diagnosis not present

## 2022-10-01 DIAGNOSIS — D23122 Other benign neoplasm of skin of left lower eyelid, including canthus: Secondary | ICD-10-CM | POA: Diagnosis not present

## 2022-10-08 ENCOUNTER — Ambulatory Visit: Payer: Medicare PPO | Attending: Cardiology | Admitting: Cardiology

## 2022-10-08 ENCOUNTER — Encounter: Payer: Self-pay | Admitting: Cardiology

## 2022-10-08 VITALS — BP 140/80 | HR 63 | Ht 63.5 in | Wt 146.4 lb

## 2022-10-08 DIAGNOSIS — D6869 Other thrombophilia: Secondary | ICD-10-CM | POA: Diagnosis not present

## 2022-10-08 DIAGNOSIS — I480 Paroxysmal atrial fibrillation: Secondary | ICD-10-CM

## 2022-10-08 NOTE — Patient Instructions (Signed)

## 2022-10-08 NOTE — Progress Notes (Signed)
Cardiology Office Note:  .   Date:  10/08/2022  ID:  Brittany Harper, DOB 1945-03-03, MRN [id number] PCP: de [place], Raymond J, MD  [facility] Providers Cardiologist:  Donato Schultz, MD Electrophysiologist:  Lanier Prude, MD     History of Present Illness: .   Brittany Harper is a 77 y.o. female Discussed with the use of AI scribe software  History of Present Illness    The patient, with a history of atrial fibrillation, underwent an ablation procedure. She has been on Eliquis and reports doing well post-ablation. Prior to the ablation, a nodule was discovered on her lung during a preoperative evaluation. This led to a delay in the ablation procedure, thankfully benign lesion, Dr. Cliffton Asters resected  The patient has also been on Multaq, which she expresses a desire to discontinue. She is considering the possibility of long-term blood thinner therapy instead of undergoing a Watchman procedure. She is scheduled to see Dr. Lalla Brothers in December, at which point the discontinuation of Multaq may be discussed.  In addition to atrial fibrillation, the patient has a history of hyperlipidemia, for which she is on simvastatin. She reports no new symptoms or health issues since her last visit.           Studies Reviewed: .         Risk Assessment/Calculations:           Physical Exam:   VS:  BP (!) 140/80   Pulse 63   Ht 5' 3.5" (1.613 m)   Wt 146 lb 6.4 oz (66.4 kg)   SpO2 95%   BMI 25.53 kg/m    Wt Readings from Last 3 Encounters:  10/08/22 146 lb 6.4 oz (66.4 kg)  09/28/22 147 lb 6.4 oz (66.9 kg)  08/31/22 150 lb (68 kg)    GEN: Well nourished, well developed in no acute distress NECK: No JVD; No carotid bruits CARDIAC: RRR, no murmurs, no rubs, no gallops RESPIRATORY:  Clear to auscultation without rales, wheezing or rhonchi  ABDOMEN: Soft, non-tender, non-distended EXTREMITIES:  No edema; No deformity   ASSESSMENT AND PLAN: .        Atrial  Fibrillation Successful ablation on August 31, 2022. Currently on Eliquis and Multaq. No recurrence of symptoms. Discussed potential discontinuation of Multaq in December 2024 with Dr. Lalla Brothers. -Continue Eliquis and Multaq until follow-up with Dr. Lalla Brothers. -Plan for potential discontinuation of Multaq in December 2024. -Currently sinus bradycardia last EKG shows heart rate of 48 bpm prior to that in the 50s.  She is asymptomatic with this.  Doing well.  Lung Nodule Resolved. No current issues. -No further action required at this time.  Dr. Cliffton Asters performed resection.  Thankfully no malignancy.  Interestingly Irving Burton, her daughter is an infectious disease doctor at the [employer] and knew Dr. Cliffton Asters from college.Marland Kitchen  Hyperlipidemia On Simvastatin. -Continue Simvastatin.  General Health Maintenance / Followup Plans -Schedule annual follow-up appointment.               Signed, Donato Schultz, MD

## 2022-10-18 ENCOUNTER — Other Ambulatory Visit: Payer: Self-pay | Admitting: Student

## 2022-10-19 ENCOUNTER — Other Ambulatory Visit: Payer: Self-pay

## 2022-10-23 ENCOUNTER — Other Ambulatory Visit (HOSPITAL_BASED_OUTPATIENT_CLINIC_OR_DEPARTMENT_OTHER): Payer: Self-pay

## 2022-10-25 ENCOUNTER — Other Ambulatory Visit (HOSPITAL_BASED_OUTPATIENT_CLINIC_OR_DEPARTMENT_OTHER): Payer: Self-pay

## 2022-10-25 MED ORDER — PANTOPRAZOLE SODIUM 40 MG PO TBEC
40.0000 mg | DELAYED_RELEASE_TABLET | Freq: Every day | ORAL | 0 refills | Status: DC
  Filled 2022-10-25: qty 45, 45d supply, fill #0

## 2022-11-23 ENCOUNTER — Other Ambulatory Visit (HOSPITAL_BASED_OUTPATIENT_CLINIC_OR_DEPARTMENT_OTHER): Payer: Self-pay

## 2022-11-23 ENCOUNTER — Other Ambulatory Visit: Payer: Self-pay | Admitting: Cardiology

## 2022-11-23 ENCOUNTER — Other Ambulatory Visit: Payer: Self-pay

## 2022-11-23 MED ORDER — METRONIDAZOLE 0.75 % EX CREA
TOPICAL_CREAM | Freq: Two times a day (BID) | CUTANEOUS | 2 refills | Status: DC
  Filled 2022-11-23: qty 45, 30d supply, fill #0
  Filled 2023-02-22: qty 45, 30d supply, fill #1
  Filled 2023-04-02: qty 45, 30d supply, fill #2

## 2022-11-23 MED ORDER — MULTAQ 400 MG PO TABS
400.0000 mg | ORAL_TABLET | Freq: Two times a day (BID) | ORAL | 0 refills | Status: DC
  Filled 2022-11-23: qty 60, 30d supply, fill #0

## 2022-11-25 ENCOUNTER — Other Ambulatory Visit (HOSPITAL_BASED_OUTPATIENT_CLINIC_OR_DEPARTMENT_OTHER): Payer: Self-pay

## 2022-12-07 NOTE — Progress Notes (Unsigned)
Cardiology Office Note:  .   Date:  12/07/2022  ID:  Brittany Harper, DOB 01-15-1945, MRN [id number] PCP: de [place], Raymond J, MD  [facility] Providers Cardiologist:  Donato Schultz, MD Electrophysiologist:  Lanier Prude, MD {  History of Present Illness: .   Brittany Harper is a 77 y.o. female w/PMHx of HLD, GERD, AFib  She saw Dr. Lalla Brothers 05/18/22, discussed pre-ablation CT discovered a lung mss, and plans/discussion for Afib ablation +/- LAAO placed on hold. Underwent LUL wedge resection/lymph node bx 04/07/22 (negative for malignancy) and was recovering well. No symptoms of Afib Planned to pursue AFib ablation and LAAO   Looks like there was some discussion of stopping Multaq post ablation  PVI ablation 08/31/22  She saw Afib clinic as usual, doing well, no symptoms of AFib, no procedural complications, symptoms  She saw Dr. Anne Fu 10/08/22, seems she was giving thoughto staying on [facility] rather then pursuing LAAO.   SB without symptoms No changes made   Today's visit is scheduled as a post ablation visit  ROS: ***  *** symptoms, burden *** procedural issues? *** stop multaq *** brady? Symptoms *** watchman? Never? *** eliquis, dose, bleeding, labs  Arrhythmia/AAD hx Multaq started 2022 PVI ablation 08/31/22  Studies Reviewed: Marland Kitchen    EKG done today and reviewed by myself:  ***   08/31/22: EPS/ablation CONCLUSIONS: 1. Successful PVI 2. Intracardiac echo reveals trivial pericardial effusion, normal left atrial architecture 3. No early apparent complications. 4. Colchicine 0.6mg  PO BID x 5 days 5. Protonix 40mg  PO daily x 45 days   03/31/22: Stress myoview   The study is normal. The study is low risk.   No ST deviation was noted.   LV perfusion is normal. There is no evidence of ischemia. There is no evidence of infarction.   Left ventricular function is normal. Nuclear stress EF: 62 %. The left ventricular ejection fraction is normal (55-65%). End diastolic  cavity size is normal. End systolic cavity size is normal.   11/23/2021: TTE  1. Left ventricular ejection fraction, by estimation, is 60 to 65%. The  left ventricle has normal function. The left ventricle has no regional  wall motion abnormalities. Left ventricular diastolic parameters are  indeterminate.   2. Right ventricular systolic function is normal. The right ventricular  size is normal.   3. The mitral valve is normal in structure. Mild to moderate mitral valve  regurgitation. No evidence of mitral stenosis.   4. Tricuspid valve regurgitation is mild to moderate.   5. The aortic valve is normal in structure. Aortic valve regurgitation is  not visualized. No aortic stenosis is present.   6. The inferior vena cava is normal in size with greater than 50%  respiratory variability, suggesting right atrial pressure of 3 mmHg.      Risk Assessment/Calculations:    Physical Exam:   VS:  There were no vitals taken for this visit.   Wt Readings from Last 3 Encounters:  10/08/22 146 lb 6.4 oz (66.4 kg)  09/28/22 147 lb 6.4 oz (66.9 kg)  08/31/22 150 lb (68 kg)    GEN: Well nourished, well developed in no acute distress NECK: No JVD; No carotid bruits CARDIAC: ***RRR, no murmurs, rubs, gallops RESPIRATORY:  *** CTA b/l without rales, wheezing or rhonchi  ABDOMEN: Soft, non-tender, non-distended EXTREMITIES:  *** No edema; No deformity   ASSESSMENT AND PLAN: .    paroxysmal AFib CHA2DS2Vasc is 3, on Eliquis, *** appropriately dosed *** burden  by symptoms  Secondary hypercoagulable state 2/2 AFib     {Are you ordering a CV Procedure (e.g. stress test, cath, DCCV, TEE, etc)?   Press F2        :[id number]}     Dispo: ***  Signed, Sheilah Pigeon, PA-C

## 2022-12-08 ENCOUNTER — Ambulatory Visit: Payer: Medicare PPO | Admitting: Cardiology

## 2022-12-09 ENCOUNTER — Encounter: Payer: Self-pay | Admitting: Physician Assistant

## 2022-12-09 ENCOUNTER — Ambulatory Visit: Payer: Medicare PPO | Attending: Physician Assistant | Admitting: Physician Assistant

## 2022-12-09 VITALS — BP 138/68 | HR 48 | Ht 63.5 in | Wt 150.0 lb

## 2022-12-09 DIAGNOSIS — D6869 Other thrombophilia: Secondary | ICD-10-CM

## 2022-12-09 DIAGNOSIS — I480 Paroxysmal atrial fibrillation: Secondary | ICD-10-CM | POA: Diagnosis not present

## 2022-12-09 NOTE — Patient Instructions (Signed)
Medication Instructions:   STOP TAKING :   MULTAQ 400 MG   Your physician recommends that you continue on your current medications as directed. Please refer to the Current Medication list given to you today.   *If you need a refill on your cardiac medications before your next appointment, please call your pharmacy*   Lab Work: NONE ORDERED  TODAY   If you have labs (blood work) drawn today and your tests are completely normal, you will receive your results only by: MyChart Message (if you have MyChart) OR A paper copy in the mail If you have any lab test that is abnormal or we need to change your treatment, we will call you to review the results.   Testing/Procedures: NONE ORDERED  TODAY     Follow-Up: At [facility], you and your health needs are our priority.  As part of our continuing mission to provide you with exceptional heart care, we have created designated Provider Care Teams.  These Care Teams include your primary Cardiologist (physician) and Advanced Practice Providers (APPs -  Physician Assistants and Nurse Practitioners) who all work together to provide you with the care you need, when you need it.  We recommend signing up for the patient portal called "MyChart".  Sign up information is provided on this After Visit Summary.  MyChart is used to connect with patients for Virtual Visits (Telemedicine).  Patients are able to view lab/test results, encounter notes, upcoming appointments, etc.  Non-urgent messages can be sent to your provider as well.   To learn more about what you can do with MyChart, go to [email or web address].    Your next appointment:    6 month(s)  Provider:    You may see Lanier Prude, MD or one of the following Advanced Practice Providers on your designated Care Team:   Francis Dowse, [place]   Other Instructions

## 2022-12-28 ENCOUNTER — Other Ambulatory Visit: Payer: Self-pay | Admitting: Cardiology

## 2022-12-30 ENCOUNTER — Other Ambulatory Visit (HOSPITAL_BASED_OUTPATIENT_CLINIC_OR_DEPARTMENT_OTHER): Payer: Self-pay

## 2022-12-30 ENCOUNTER — Other Ambulatory Visit: Payer: Self-pay

## 2022-12-30 ENCOUNTER — Other Ambulatory Visit: Payer: Self-pay | Admitting: Cardiology

## 2022-12-30 DIAGNOSIS — I480 Paroxysmal atrial fibrillation: Secondary | ICD-10-CM

## 2022-12-30 MED ORDER — APIXABAN 5 MG PO TABS
5.0000 mg | ORAL_TABLET | Freq: Two times a day (BID) | ORAL | 3 refills | Status: DC
  Filled 2022-12-30 – 2023-01-06 (×2): qty 180, 90d supply, fill #0
  Filled 2023-04-27: qty 180, 90d supply, fill #1
  Filled 2023-07-24: qty 180, 90d supply, fill #2
  Filled 2023-10-21: qty 180, 90d supply, fill #3

## 2022-12-30 MED ORDER — PANTOPRAZOLE SODIUM 40 MG PO TBEC
40.0000 mg | DELAYED_RELEASE_TABLET | Freq: Every day | ORAL | 3 refills | Status: DC
  Filled 2022-12-30: qty 90, 90d supply, fill #0
  Filled 2023-04-02: qty 90, 90d supply, fill #1
  Filled 2023-07-24: qty 90, 90d supply, fill #2

## 2022-12-30 NOTE — Telephone Encounter (Signed)
Prescription refill request for Eliquis received. Indication: a fib Last office visit: 12/09/22 Scr: 1.04 08/18/22 epic Age: 77 Weight: 68kg

## 2023-01-06 ENCOUNTER — Other Ambulatory Visit (HOSPITAL_BASED_OUTPATIENT_CLINIC_OR_DEPARTMENT_OTHER): Payer: Self-pay

## 2023-01-24 ENCOUNTER — Other Ambulatory Visit (HOSPITAL_BASED_OUTPATIENT_CLINIC_OR_DEPARTMENT_OTHER): Payer: Self-pay

## 2023-02-21 ENCOUNTER — Other Ambulatory Visit (HOSPITAL_BASED_OUTPATIENT_CLINIC_OR_DEPARTMENT_OTHER): Payer: Self-pay

## 2023-02-22 ENCOUNTER — Other Ambulatory Visit: Payer: Self-pay

## 2023-02-22 ENCOUNTER — Other Ambulatory Visit (HOSPITAL_BASED_OUTPATIENT_CLINIC_OR_DEPARTMENT_OTHER): Payer: Self-pay

## 2023-03-29 DIAGNOSIS — H524 Presbyopia: Secondary | ICD-10-CM | POA: Diagnosis not present

## 2023-03-29 DIAGNOSIS — H26493 Other secondary cataract, bilateral: Secondary | ICD-10-CM | POA: Diagnosis not present

## 2023-03-29 DIAGNOSIS — H52203 Unspecified astigmatism, bilateral: Secondary | ICD-10-CM | POA: Diagnosis not present

## 2023-03-29 DIAGNOSIS — H5201 Hypermetropia, right eye: Secondary | ICD-10-CM | POA: Diagnosis not present

## 2023-03-29 DIAGNOSIS — H04123 Dry eye syndrome of bilateral lacrimal glands: Secondary | ICD-10-CM | POA: Diagnosis not present

## 2023-03-29 DIAGNOSIS — H18513 Endothelial corneal dystrophy, bilateral: Secondary | ICD-10-CM | POA: Diagnosis not present

## 2023-04-01 DIAGNOSIS — L820 Inflamed seborrheic keratosis: Secondary | ICD-10-CM | POA: Diagnosis not present

## 2023-04-19 ENCOUNTER — Encounter: Payer: Self-pay | Admitting: Cardiology

## 2023-04-19 ENCOUNTER — Encounter (HOSPITAL_BASED_OUTPATIENT_CLINIC_OR_DEPARTMENT_OTHER): Payer: Medicare PPO

## 2023-04-20 ENCOUNTER — Encounter: Payer: Self-pay | Admitting: Cardiology

## 2023-04-20 ENCOUNTER — Ambulatory Visit: Attending: Cardiology | Admitting: Cardiology

## 2023-04-20 VITALS — BP 120/82 | HR 59 | Ht 63.5 in | Wt 144.2 lb

## 2023-04-20 DIAGNOSIS — I480 Paroxysmal atrial fibrillation: Secondary | ICD-10-CM | POA: Diagnosis not present

## 2023-04-20 DIAGNOSIS — D6869 Other thrombophilia: Secondary | ICD-10-CM | POA: Diagnosis not present

## 2023-04-20 NOTE — Progress Notes (Signed)
 Cardiology Office Note:  .   Date:  04/20/2023  ID:  Brittany Harper, DOB 08/05/1945, MRN [id number] PCP: de [place], Raymond J, MD  [facility] Providers Cardiologist:  Dorothye Gathers, MD Electrophysiologist:  Boyce Byes, MD     History of Present Illness: .   Brittany Harper is a 78 y.o. female Discussed the use of AI scribe software for clinical note transcription with the patient, who gave verbal consent to proceed.  History of Present Illness Brittany Harper "Brittany Harper" is a 78 year old female with atrial fibrillation post-PVI ablation who presents for follow-up.  She experienced shortness of breath over the weekend, which has since improved. On Friday, she felt 'in a fog,' very tired, and aching all over, with some shortness of breath. She slept extensively and felt better by Saturday.  She has a history of atrial fibrillation and underwent PVI ablation on August 31, 2022. She previously used Multaq in 2022. She is currently taking Eliquis 5 mg twice a day for anticoagulation. She no longer wishes to pursue the Watchman device and prefers to remain on oral anticoagulation.  She experiences daily chest pain attributed to GERD, described as 'heartburn.' She takes pantoprazole half an hour before breakfast but still experiences heartburn within half an hour of eating. She sometimes takes Pepcid or drinks water to alleviate symptoms. She had a fundoplication procedure four years ago for a hiatal hernia, but recent imaging showed a moderate hiatal hernia again.  She mentions a recent wide QRS finding on her weekly [employer] readings. Her heart rate was noted to be 55 beats per minute during these readings.  She takes simvastatin 10 mg daily for hyperlipidemia, with an LDL of 98. Her CT calcium score is 17, placing her in the 29th percentile.    Studies Reviewed: Aaron Aas   EKG Interpretation Date/Time:  Wednesday April 20 2023 10:44:40 EDT Ventricular Rate:  53 PR Interval:  138 QRS  Duration:  84 QT Interval:  446 QTC Calculation: 418 R Axis:   28  Text Interpretation: Sinus bradycardia Nonspecific ST and T wave abnormality When compared with ECG of 09-Dec-2022 15:24, No significant change since last tracing Confirmed by Dorothye Gathers ([id number]) on 04/20/2023 10:47:02 AM    Results LABS Hemoglobin: 13.5 g/dL  RADIOLOGY CT calcium score: 17, 29th percentile  DIAGNOSTIC Stress test: Normal, low risk (2024) Echocardiography: EF 65% (2023) EKG: Narrow complex QRS, sinus rhythm, heart rate 59 bpm (04/20/2023) Risk Assessment/Calculations:            Physical Exam:   VS:  BP 120/82   Pulse (!) 59   Ht 5' 3.5" (1.613 m)   Wt 144 lb 3.2 oz (65.4 kg)   SpO2 96%   BMI 25.14 kg/m    Wt Readings from Last 3 Encounters:  04/20/23 144 lb 3.2 oz (65.4 kg)  12/09/22 150 lb (68 kg)  10/08/22 146 lb 6.4 oz (66.4 kg)    GEN: Well nourished, well developed in no acute distress NECK: No JVD; No carotid bruits CARDIAC: RRR, no murmurs, no rubs, no gallops RESPIRATORY:  Clear to auscultation without rales, wheezing or rhonchi  ABDOMEN: Soft, non-tender, non-distended EXTREMITIES:  No edema; No deformity   ASSESSMENT AND PLAN: .    Assessment and Plan Assessment & Plan Atrial Fibrillation She is here for follow-up after PVI ablation on August 31, 2022. Currently on Eliquis 5 mg twice a day for anticoagulation. She prefers to continue oral anticoagulation over the Watchman device. Recent EKG  Harper narrow complex QRS with sinus rhythm and a heart rate of 59 bpm, which is reassuring. - Continue Eliquis 5 mg twice a day - Perform EKG to assess QRS complex  Shortness of Breath She reported shortness of breath over the weekend, associated with a viral-like illness, including fatigue and body aches, which resolved with rest. The shortness of breath is not related to cardiac issues as confirmed by the recent EKG. - Advise rest if similar symptoms recur  Hyperlipidemia She is  on simvastatin 10 mg daily for hyperlipidemia. Her LDL cholesterol is currently 98 mg/dL, which is acceptable. - Continue simvastatin 10 mg daily  Gastroesophageal Reflux Disease (GERD) She experiences daily heartburn despite taking pantoprazole. Sometimes takes Pepcid or drinks water for relief. Had a fundoplication four years ago, but a recent CT scan showed a moderate hiatal hernia, indicating recurrence. The heartburn is not related to coronary artery disease as confirmed by a CT scan. Fundoplication was explained to work in 96% of cases, indicating she is among the 4% where it did not fully resolve the issue. - Consider alternative GERD management strategies - Discuss potential surgical options for hiatal hernia recurrence  Hiatal Hernia She had a fundoplication four years ago, but a recent CT scan showed a moderate hiatal hernia, indicating recurrence. This may be contributing to her GERD symptoms. - Discuss potential surgical options for hiatal hernia recurrence         Dispo: 1 yr  Signed, Dorothye Gathers, MD

## 2023-04-20 NOTE — Patient Instructions (Signed)

## 2023-04-27 ENCOUNTER — Other Ambulatory Visit (HOSPITAL_BASED_OUTPATIENT_CLINIC_OR_DEPARTMENT_OTHER): Payer: Self-pay

## 2023-05-03 ENCOUNTER — Ambulatory Visit (HOSPITAL_BASED_OUTPATIENT_CLINIC_OR_DEPARTMENT_OTHER): Admitting: *Deleted

## 2023-05-03 ENCOUNTER — Encounter (HOSPITAL_BASED_OUTPATIENT_CLINIC_OR_DEPARTMENT_OTHER): Payer: Self-pay

## 2023-05-03 DIAGNOSIS — Z0000 Encounter for general adult medical examination without abnormal findings: Secondary | ICD-10-CM

## 2023-05-03 NOTE — Progress Notes (Signed)
 Subjective:   Brittany Harper is a 78 y.o. female who presents for Medicare Annual (Subsequent) preventive examination.  Visit Complete: Virtual I connected with  Brittany Harper on 05/03/23 by a audio enabled telemedicine application and verified that I am speaking with the correct person using two identifiers.  Patient Location: Home  Provider Location: Home Office  I discussed the limitations of evaluation and management by telemedicine. The patient expressed understanding and agreed to proceed.  Vital Signs: Because this visit was a virtual/telehealth visit, some criteria may be missing or patient reported. Any vitals not documented were not able to be obtained and vitals that have been documented are patient reported.  Patient Medicare AWV questionnaire was completed by the patient on 05-02-2023; I have confirmed that all information answered by patient is correct and no changes since this date.  Cardiac Risk Factors include: advanced age (>66men, >75 women)     Objective:    There were no vitals filed for this visit. There is no height or weight on file to calculate BMI.     05/03/2023   10:49 AM 08/31/2022    6:23 AM 04/05/2022    1:14 PM 04/06/2021    9:39 AM 12/27/2020    3:38 PM 04/04/2020    8:38 AM 10/16/2018   11:00 AM  Advanced Directives  Does Patient Have a Medical Advance Directive? Yes Yes Yes Yes No Yes Yes  Type of Estate agent of [employer] Power of [place];Living will Healthcare Power of [place];Living will Healthcare Power of [place];Living will  Living will;Out of facility DNR (pink MOST or yellow form);Healthcare Power of [employer] of [place];Living will  Does patient want to make changes to medical advance directive?  No - Patient declined  No - Patient declined  Yes (MAU/Ambulatory/Procedural Areas - Information given) No - Patient declined  Copy of Healthcare Power of Attorney in Chart? Yes - validated most recent  copy scanned in chart (See row information) No - copy requested No - copy requested No - copy requested  No - copy requested No - copy requested  Would patient like information on creating a medical advance directive?     Yes (ED - Information included in AVS)      Current Medications (verified) Outpatient Encounter Medications as of 05/03/2023  Medication Sig   acetaminophen  (TYLENOL ) 500 MG tablet Take 500 mg by mouth every 6 (six) hours as needed for moderate pain.   AMBULATORY NON FORMULARY MEDICATION Apply 1 application  topically in the morning and at bedtime. Hempvanna lotion   apixaban  (ELIQUIS ) 5 MG TABS tablet Take 1 tablet (5 mg total) by mouth 2 (two) times daily.   Calcium  Carb-Cholecalciferol (CALCIUM  600 + D PO) Take 2 tablets by mouth daily.   fexofenadine (ALLEGRA) 180 MG tablet Take 180 mg by mouth daily.   hypromellose (SYSTANE OVERNIGHT THERAPY) 0.3 % GEL ophthalmic ointment Place 1 application into both eyes at bedtime.   metroNIDAZOLE  (METROCREAM ) 0.75 % cream Apply 1 application on the skin twice a day   Misc Natural Products (OSTEO BI-FLEX ADV JOINT SHIELD PO) Take 2 tablets by mouth daily with lunch.    Multiple Vitamins-Minerals (CENTRUM SILVER ULTRA WOMENS PO) Take 1 tablet by mouth daily at 12 noon. 50+   pantoprazole  (PROTONIX ) 40 MG tablet Take 1 tablet (40 mg total) by mouth daily.   Polyethyl Glycol-Propyl Glycol (SYSTANE) 0.4-0.3 % SOLN Place 1 drop into both eyes every morning.   simvastatin  (ZOCOR ) 10  MG tablet Take 1 tablet (10 mg total) by mouth at bedtime.   No facility-administered encounter medications on file as of 05/03/2023.    Allergies (verified) Ivp dye [iodinated contrast media], Augmentin [amoxicillin-pot clavulanate], Demerol [meperidine], Monosodium glutamate, Morphine and codeine, and Shellfish allergy   History: Past Medical History:  Diagnosis Date   Allergy    Arthritis    Asthma    Cancer (HCC)    basal cell   Dysrhythmia    A.  fib   Gastric ulcer without hemorrhage or perforation    GERD (gastroesophageal reflux disease)    Hiatal hernia with GERD and esophagitis 10/16/2018   Hx of skin cancer, basal cell    Hyperlipidemia    Memory change 04/05/2016   Past Surgical History:  Procedure Laterality Date   ABDOMINAL HYSTERECTOMY     ATRIAL FIBRILLATION ABLATION N/A 08/31/2022   Procedure: ATRIAL FIBRILLATION ABLATION;  Surgeon: Boyce Byes, MD;  Location: [facility];  Service: Cardiovascular;  Laterality: N/A;   BIOPSY  10/16/2018   Procedure: BIOPSY;  Surgeon: Annis Kinder, DO;  Location: [facility];  Service: Gastroenterology;;   CATARACT EXTRACTION Bilateral    COLONOSCOPY     ESOPHAGOGASTRODUODENOSCOPY (EGD) WITH PROPOFOL  N/A 10/16/2018   Procedure: ESOPHAGOGASTRODUODENOSCOPY (EGD) WITH PROPOFOL ;  Surgeon: Annis Kinder, DO;  Location: [facility];  Service: Gastroenterology;  Laterality: N/A;   LUNG SURGERY Left 04/07/2022   NODE DISSECTION Left 04/07/2022   Procedure: NODE DISSECTION;  Surgeon: Hilarie Lovely, MD;  Location: [facility] OR;  Service: Thoracic;  Laterality: Left;   TRANSORAL INCISIONLESS FUNDOPLICATION N/A 10/16/2018   Procedure: TRANSORAL INCISIONLESS FUNDOPLICATION;  Surgeon: Annis Kinder, DO;  Location: [facility];  Service: Gastroenterology;  Laterality: N/A;   Family History  Problem Relation Age of Onset   Arthritis Mother    Breast cancer Mother        breast and ovarian, and basal cell   Ovarian cancer Mother    Basal cell carcinoma Mother    Arthritis Father    Basal cell carcinoma Father        basal cell   Heart attack Father    Diabetes Sister    Obesity Sister    Arthritis Sister    Diabetes Paternal Uncle    Diabetes Paternal Grandmother    Diabetes Paternal Grandfather    Stomach cancer Paternal Ruthann Cover' disease Maternal Grandfather    Heart attack Maternal Grandfather    Colon cancer Neg Hx    Pancreatic cancer Neg Hx     Social History   Socioeconomic History   Marital status: Widowed    Spouse name: Not on file   Number of children: 4   Years of education: 79   Highest education level: Master's degree (e.g., MA, MS, MEng, MEd, MSW, MBA)  Occupational History   Occupation: retired Runner, broadcasting/film/video   Tobacco Use   Smoking status: Never   Smokeless tobacco: Never   Tobacco comments:    Never smoked 09/28/22  Vaping Use   Vaping status: Never Used  Substance and Sexual Activity   Alcohol  use: Yes    Alcohol /week: 2.0 - 3.0 standard drinks of alcohol     Types: 1 Glasses of wine, 1 - 2 Standard drinks or equivalent per week    Comment: 1-2 glasses of wine weekly 09/28/22   Drug use: No   Sexual activity: Not Currently  Other Topics Concern   Not on file  Social History  Narrative   Lives in [facility]. Actively involved with social interactions and the community.    Social Drivers of Corporate investment banker Strain: Low Risk  (05/03/2023)   Overall Financial Resource Strain (CARDIA)    Difficulty of Paying Living Expenses: Not hard at all  Food Insecurity: No Food Insecurity (05/03/2023)   Hunger Vital Sign    Worried About Running Out of Food in the Last Year: Never true    Ran Out of Food in the Last Year: Never true  Transportation Needs: No Transportation Needs (05/03/2023)   PRAPARE - Administrator, Civil Service (Medical): No    Lack of Transportation (Non-Medical): No  Physical Activity: Sufficiently Active (05/03/2023)   Exercise Vital Sign    Days of Exercise per Week: 5 days    Minutes of Exercise per Session: 30 min  Stress: No Stress Concern Present (05/03/2023)   [employer] of Occupational Health - Occupational Stress Questionnaire    Feeling of Stress : Only a little  Social Connections: Moderately Integrated (05/03/2023)   Social Connection and Isolation Panel [NHANES]    Frequency of Communication with Friends and Family: Three times a week     Frequency of Social Gatherings with Friends and Family: More than three times a week    Attends Religious Services: More than 4 times per year    Active Member of Clubs or Organizations: Yes    Attends Banker Meetings: More than 4 times per year    Marital Status: Widowed    Tobacco Counseling Counseling given: Not Answered Tobacco comments: Never smoked 09/28/22   Clinical Intake:  Pre-visit preparation completed: Yes  Pain : No/denies pain        How often do you need to have someone help you when you read instructions, pamphlets, or other written materials from your doctor or pharmacy?: 1 - Never  Interpreter Needed?: No  Information entered by :: Kieth Pelt LPN   Activities of Daily Living    05/03/2023   10:51 AM 05/02/2023   11:42 AM  In your present state of health, do you have any difficulty performing the following activities:  Hearing? 0 0  Vision? 0 0  Difficulty concentrating or making decisions? 0 0  Walking or climbing stairs? 0 0  Dressing or bathing? 0 0  Doing errands, shopping? 0 0  Preparing Food and eating ? N N  Using the Toilet? N N  In the past six months, have you accidently leaked urine? N N  Do you have problems with loss of bowel control? N N  Managing your Medications? N N  Managing your Finances? N N  Housekeeping or managing your Housekeeping? N N    Patient Care Team: de [place], Alonza Jansky, MD as PCP - General (Family Medicine) Hugh Madura, MD as PCP - Cardiology (Cardiology) Boyce Byes, MD as PCP - Electrophysiology (Cardiology)  Indicate any recent Medical Services you may have received from other than [facility] providers in the past year (date may be approximate).     Assessment:   This is a routine wellness examination for Brittany Harper.  Hearing/Vision screen Hearing Screening - Comments:: No trouble hearing Vision Screening - Comments:: [facility] Up to date   Goals Addressed             This Visit's  Progress    Patient Stated       More consistent with exercise  Depression Screen    05/03/2023   10:51 AM 07/05/2022    2:47 PM 04/13/2022   10:31 AM 04/06/2021    8:59 AM 04/04/2020    8:40 AM 03/31/2020    9:07 AM 03/15/2019    8:59 AM  PHQ 2/9 Scores  PHQ - 2 Score 2 0 0 0 0 0 2  PHQ- 9 Score 5 0    0 4  Exception Documentation    Medical reason       Fall Risk    05/03/2023   10:50 AM 05/02/2023   11:42 AM 07/05/2022    2:46 PM 04/13/2022   10:31 AM 04/06/2021    8:59 AM  Fall Risk   Falls in the past year? 0 0 0 0 0  Number falls in past yr: 0  0 0 0  Injury with Fall? 0  0 0 0  Risk for fall due to :   No Fall Risks No Fall Risks No Fall Risks  Follow up Falls evaluation completed;Education provided;Falls prevention discussed  Falls evaluation completed Falls prevention discussed Falls evaluation completed    MEDICARE RISK AT HOME: Medicare Risk at Home Any stairs in or around the home?: No If so, are there any without handrails?: No Home free of loose throw rugs in walkways, pet beds, electrical cords, etc?: Yes Adequate lighting in your home to reduce risk of falls?: Yes Life alert?: No Use of a cane, walker or w/c?: No Grab bars in the bathroom?: Yes Shower chair or bench in shower?: Yes Elevated toilet seat or a handicapped toilet?: No  TIMED UP AND GO:  Was the test performed?  No    Cognitive Function:        05/03/2023   10:50 AM 04/13/2022   10:33 AM 04/06/2021    9:40 AM 04/04/2020    8:42 AM  6CIT Screen  What Year? 0 points 0 points 0 points 0 points  What month? 0 points 0 points 0 points 0 points  What time? 0 points 0 points 0 points 0 points  Count back from 20 0 points 0 points 0 points 0 points  Months in reverse 0 points 0 points 0 points 0 points  Repeat phrase 0 points 0 points 0 points 0 points  Total Score 0 points 0 points 0 points 0 points    Immunizations Immunization History  Administered Date(s) Administered   Fluad Quad(high  Dose 65+) 10/06/2021   Fluad Trivalent(High Dose 65+) 09/07/2022   Influenza, High Dose Seasonal PF 10/04/2016, 09/07/2018   Influenza-Unspecified 10/16/2014, 09/15/2015, 10/03/2017, 09/21/2019, 09/14/2020   Moderna Covid-19 Fall Seasonal Vaccine 23yrs & older 04/29/2022, 09/20/2022   Moderna Covid-19 Vaccine Bivalent Booster 64yrs & up 10/25/2020   Moderna SARS-COV2 Booster Vaccination 04/17/2020   Moderna Sars-Covid-2 Vaccination 01/16/2019, 02/14/2019, 11/20/2019   Pneumococcal Conjugate-13 12/12/2014   Pneumococcal Polysaccharide-23 10/22/2008   Respiratory Syncytial Virus Vaccine ,Recomb Aduvanted(Arexvy ) 12/03/2021   Tdap 06/17/2011, 12/15/2015   Unspecified SARS-COV-2 Vaccination 11/04/2021   Zoster Recombinant(Shingrix) 01/17/2017, 03/28/2017   Zoster, Live 01/05/2004    TDAP status: Up to date  Flu Vaccine status: Up to date  Pneumococcal vaccine status: Up to date  Covid-19 vaccine status: Information provided on how to obtain vaccines.   Qualifies for Shingles Vaccine? No   Zostavax completed Yes   Shingrix Completed?: Yes  Screening Tests Health Maintenance  Topic Date Due   COVID-19 Vaccine (8 - Moderna risk 2024-25 season) 03/20/2023   INFLUENZA VACCINE  08/05/2023  Medicare Annual Wellness (AWV)  05/02/2024   DTaP/Tdap/Td (3 - Td or Tdap) 12/14/2025   Pneumonia Vaccine 67+ Years old  Completed   DEXA SCAN  Completed   Hepatitis C Screening  Completed   Zoster Vaccines- Shingrix  Completed   HPV VACCINES  Aged Out   Meningococcal B Vaccine  Aged Out   Colonoscopy  Discontinued   Fecal DNA (Cologuard)  Discontinued    Health Maintenance  Health Maintenance Due  Topic Date Due   COVID-19 Vaccine (8 - Moderna risk 2024-25 season) 03/20/2023    Colorectal cancer screening: No longer required.   Bone Density status: Completed 2022. Results reflect: Bone density results: OSTEOPENIA. Repeat every 3-5 years.  Lung Cancer Screening: (Low Dose CT Chest  recommended if Age 59-80 years, 20 pack-year currently smoking OR have quit w/in 15years.) does not qualify.   Lung Cancer Screening Referral:   Additional Screening:  Hepatitis C Screening: does not qualify; Completed 2017  Vision Screening: Recommended annual ophthalmology exams for early detection of glaucoma and other disorders of the eye. Is the patient up to date with their annual eye exam?  Yes  Who is the provider or what is the name of the office in which the patient attends annual eye exams? [facility] If pt is not established with a provider, would they like to be referred to a provider to establish care? No .   Dental Screening: Recommended annual dental exams for proper oral hygiene    Community Resource Referral / Chronic Care Management: CRR required this visit?  No   CCM required this visit?  No     Plan:     I have personally reviewed and noted the following in the patient's chart:   Medical and social history Use of alcohol , tobacco or illicit drugs  Current medications and supplements including opioid prescriptions. Patient is not currently taking opioid prescriptions. Functional ability and status Nutritional status Physical activity Advanced directives List of other physicians Hospitalizations, surgeries, and ER visits in previous 12 months Vitals Screenings to include cognitive, depression, and falls Referrals and appointments  In addition, I have reviewed and discussed with patient certain preventive protocols, quality metrics, and best practice recommendations. A written personalized care plan for preventive services as well as general preventive health recommendations were provided to patient.     Kieth Pelt, LPN   01/06/7251   After Visit Summary: (MyChart) Due to this being a telephonic visit, the after visit summary with patients personalized plan was offered to patient via MyChart   Nurse Notes:

## 2023-05-03 NOTE — Patient Instructions (Signed)
 Ms. Brittany Harper , Thank you for taking time to come for your Medicare Wellness Visit. I appreciate your ongoing commitment to your health goals. Please review the following plan we discussed and let me know if I can assist you in the future.   Screening recommendations/referrals: Colonoscopy: no longer required Mammogram:  Bone Density: up to date  education provided Recommended yearly ophthalmology/optometry visit for glaucoma screening and checkup Recommended yearly dental visit for hygiene and checkup  Vaccinations: Influenza vaccine: up to date Pneumococcal vaccine: up to date Tdap vaccine: up to date Shingles vaccine: up to date      Preventive Care 65 Years and Older, Female Preventive care refers to lifestyle choices and visits with your health care provider that can promote health and wellness. What does preventive care include? A yearly physical exam. This is also called an annual well check. Dental exams once or twice a year. Routine eye exams. Ask your health care provider how often you should have your eyes checked. Personal lifestyle choices, including: Daily care of your teeth and gums. Regular physical activity. Eating a healthy diet. Avoiding tobacco and drug use. Limiting alcohol  use. Practicing safe sex. Taking low-dose aspirin  every day. Taking vitamin and mineral supplements as recommended by your health care provider. What happens during an annual well check? The services and screenings done by your health care provider during your annual well check will depend on your age, overall health, lifestyle risk factors, and family history of disease. Counseling  Your health care provider may ask you questions about your: Alcohol  use. Tobacco use. Drug use. Emotional well-being. Home and relationship well-being. Sexual activity. Eating habits. History of falls. Memory and ability to understand (cognition). Work and work Astronomer. Reproductive  health. Screening  You may have the following tests or measurements: Height, weight, and BMI. Blood pressure. Lipid and cholesterol levels. These may be checked every 5 years, or more frequently if you are over 36 years old. Skin check. Lung cancer screening. You may have this screening every year starting at age 8 if you have a 30-pack-year history of smoking and currently smoke or have quit within the past 15 years. Fecal occult blood test (FOBT) of the stool. You may have this test every year starting at age 12. Flexible sigmoidoscopy or colonoscopy. You may have a sigmoidoscopy every 5 years or a colonoscopy every 10 years starting at age 31. Hepatitis C blood test. Hepatitis B blood test. Sexually transmitted disease (STD) testing. Diabetes screening. This is done by checking your blood sugar (glucose) after you have not eaten for a while (fasting). You may have this done every 1-3 years. Bone density scan. This is done to screen for osteoporosis. You may have this done starting at age 67. Mammogram. This may be done every 1-2 years. Talk to your health care provider about how often you should have regular mammograms. Talk with your health care provider about your test results, treatment options, and if necessary, the need for more tests. Vaccines  Your health care provider may recommend certain vaccines, such as: Influenza vaccine. This is recommended every year. Tetanus, diphtheria, and acellular pertussis (Tdap, Td) vaccine. You may need a Td booster every 10 years. Zoster vaccine. You may need this after age 84. Pneumococcal 13-valent conjugate (PCV13) vaccine. One dose is recommended after age 70. Pneumococcal polysaccharide (PPSV23) vaccine. One dose is recommended after age 47. Talk to your health care provider about which screenings and vaccines you need and how often you need them.  This information is not intended to replace advice given to you by your health care provider.  Make sure you discuss any questions you have with your health care provider. Document Released: 01/17/2015 Document Revised: 09/10/2015 Document Reviewed: 10/22/2014 Elsevier Interactive Patient Education  2017 [employer].  Fall Prevention in the Home Falls can cause injuries. They can happen to people of all ages. There are many things you can do to make your home safe and to help prevent falls. What can I do on the outside of my home? Regularly fix the edges of walkways and driveways and fix any cracks. Remove anything that might make you trip as you walk through a door, such as a raised step or threshold. Trim any bushes or trees on the path to your home. Use bright outdoor lighting. Clear any walking paths of anything that might make someone trip, such as rocks or tools. Regularly check to see if handrails are loose or broken. Make sure that both sides of any steps have handrails. Any raised decks and porches should have guardrails on the edges. Have any leaves, snow, or ice cleared regularly. Use sand or salt on walking paths during winter. Clean up any spills in your garage right away. This includes oil or grease spills. What can I do in the bathroom? Use night lights. Install grab bars by the toilet and in the tub and shower. Do not use towel bars as grab bars. Use non-skid mats or decals in the tub or shower. If you need to sit down in the shower, use a plastic, non-slip stool. Keep the floor dry. Clean up any water that spills on the floor as soon as it happens. Remove soap buildup in the tub or shower regularly. Attach bath mats securely with double-sided non-slip rug tape. Do not have throw rugs and other things on the floor that can make you trip. What can I do in the bedroom? Use night lights. Make sure that you have a light by your bed that is easy to reach. Do not use any sheets or blankets that are too big for your bed. They should not hang down onto the floor. Have a  firm chair that has side arms. You can use this for support while you get dressed. Do not have throw rugs and other things on the floor that can make you trip. What can I do in the kitchen? Clean up any spills right away. Avoid walking on wet floors. Keep items that you use a lot in easy-to-reach places. If you need to reach something above you, use a strong step stool that has a grab bar. Keep electrical cords out of the way. Do not use floor polish or wax that makes floors slippery. If you must use wax, use non-skid floor wax. Do not have throw rugs and other things on the floor that can make you trip. What can I do with my stairs? Do not leave any items on the stairs. Make sure that there are handrails on both sides of the stairs and use them. Fix handrails that are broken or loose. Make sure that handrails are as long as the stairways. Check any carpeting to make sure that it is firmly attached to the stairs. Fix any carpet that is loose or worn. Avoid having throw rugs at the top or bottom of the stairs. If you do have throw rugs, attach them to the floor with carpet tape. Make sure that you have a light switch at the  top of the stairs and the bottom of the stairs. If you do not have them, ask someone to add them for you. What else can I do to help prevent falls? Wear shoes that: Do not have high heels. Have rubber bottoms. Are comfortable and fit you well. Are closed at the toe. Do not wear sandals. If you use a stepladder: Make sure that it is fully opened. Do not climb a closed stepladder. Make sure that both sides of the stepladder are locked into place. Ask someone to hold it for you, if possible. Clearly mark and make sure that you can see: Any grab bars or handrails. First and last steps. Where the edge of each step is. Use tools that help you move around (mobility aids) if they are needed. These include: Canes. Walkers. Scooters. Crutches. Turn on the lights when you  go into a dark area. Replace any light bulbs as soon as they burn out. Set up your furniture so you have a clear path. Avoid moving your furniture around. If any of your floors are uneven, fix them. If there are any pets around you, be aware of where they are. Review your medicines with your doctor. Some medicines can make you feel dizzy. This can increase your chance of falling. Ask your doctor what other things that you can do to help prevent falls. This information is not intended to replace advice given to you by your health care provider. Make sure you discuss any questions you have with your health care provider. Document Released: 10/17/2008 Document Revised: 05/29/2015 Document Reviewed: 01/25/2014 Elsevier Interactive Patient Education  2017 [employer].

## 2023-06-10 ENCOUNTER — Other Ambulatory Visit (HOSPITAL_BASED_OUTPATIENT_CLINIC_OR_DEPARTMENT_OTHER): Payer: Self-pay | Admitting: Family Medicine

## 2023-06-10 DIAGNOSIS — Z1231 Encounter for screening mammogram for malignant neoplasm of breast: Secondary | ICD-10-CM

## 2023-07-05 ENCOUNTER — Other Ambulatory Visit (HOSPITAL_BASED_OUTPATIENT_CLINIC_OR_DEPARTMENT_OTHER): Payer: Self-pay | Admitting: Family Medicine

## 2023-07-05 ENCOUNTER — Other Ambulatory Visit (HOSPITAL_BASED_OUTPATIENT_CLINIC_OR_DEPARTMENT_OTHER): Payer: Self-pay

## 2023-07-05 ENCOUNTER — Encounter (HOSPITAL_BASED_OUTPATIENT_CLINIC_OR_DEPARTMENT_OTHER): Payer: Self-pay | Admitting: *Deleted

## 2023-07-05 MED ORDER — SIMVASTATIN 10 MG PO TABS
10.0000 mg | ORAL_TABLET | Freq: Every day | ORAL | 3 refills | Status: AC
  Filled 2023-07-05: qty 90, 90d supply, fill #0
  Filled 2023-10-25: qty 90, 90d supply, fill #1
  Filled 2024-01-26: qty 90, 90d supply, fill #2

## 2023-07-25 ENCOUNTER — Encounter (HOSPITAL_BASED_OUTPATIENT_CLINIC_OR_DEPARTMENT_OTHER): Payer: Self-pay | Admitting: Radiology

## 2023-07-25 ENCOUNTER — Ambulatory Visit (HOSPITAL_BASED_OUTPATIENT_CLINIC_OR_DEPARTMENT_OTHER)
Admission: RE | Admit: 2023-07-25 | Discharge: 2023-07-25 | Disposition: A | Discharge status: Home / Self Care | Source: Ambulatory Visit | Attending: Family Medicine | Admitting: Family Medicine

## 2023-07-25 ENCOUNTER — Other Ambulatory Visit (HOSPITAL_BASED_OUTPATIENT_CLINIC_OR_DEPARTMENT_OTHER): Payer: Self-pay

## 2023-07-25 DIAGNOSIS — Z1231 Encounter for screening mammogram for malignant neoplasm of breast: Secondary | ICD-10-CM | POA: Diagnosis not present

## 2023-07-28 ENCOUNTER — Other Ambulatory Visit: Payer: Self-pay | Admitting: Family Medicine

## 2023-07-28 DIAGNOSIS — R928 Other abnormal and inconclusive findings on diagnostic imaging of breast: Secondary | ICD-10-CM

## 2023-08-01 ENCOUNTER — Other Ambulatory Visit: Payer: Self-pay | Admitting: Family Medicine

## 2023-08-01 DIAGNOSIS — R928 Other abnormal and inconclusive findings on diagnostic imaging of breast: Secondary | ICD-10-CM

## 2023-08-01 DIAGNOSIS — R921 Mammographic calcification found on diagnostic imaging of breast: Secondary | ICD-10-CM

## 2023-08-03 ENCOUNTER — Ambulatory Visit (HOSPITAL_BASED_OUTPATIENT_CLINIC_OR_DEPARTMENT_OTHER): Payer: Self-pay | Admitting: Family Medicine

## 2023-08-03 ENCOUNTER — Other Ambulatory Visit (HOSPITAL_BASED_OUTPATIENT_CLINIC_OR_DEPARTMENT_OTHER): Payer: Self-pay

## 2023-08-03 MED ORDER — METRONIDAZOLE 0.75 % EX CREA
TOPICAL_CREAM | Freq: Two times a day (BID) | CUTANEOUS | 2 refills | Status: AC
  Filled 2023-08-03: qty 45, 30d supply, fill #0
  Filled 2023-09-24: qty 45, 30d supply, fill #1
  Filled 2024-01-10: qty 45, 30d supply, fill #2

## 2023-08-03 NOTE — Telephone Encounter (Signed)
 Called and LVM/ related the message from Dr. De [place].

## 2023-08-19 ENCOUNTER — Ambulatory Visit
Admission: RE | Admit: 2023-08-19 | Discharge: 2023-08-19 | Disposition: A | Discharge status: Home / Self Care | Source: Ambulatory Visit | Attending: Family Medicine | Admitting: Family Medicine

## 2023-08-19 ENCOUNTER — Ambulatory Visit

## 2023-08-19 ENCOUNTER — Other Ambulatory Visit: Payer: Self-pay | Admitting: Family Medicine

## 2023-08-19 ENCOUNTER — Other Ambulatory Visit

## 2023-08-19 DIAGNOSIS — R928 Other abnormal and inconclusive findings on diagnostic imaging of breast: Secondary | ICD-10-CM | POA: Diagnosis not present

## 2023-08-19 DIAGNOSIS — R921 Mammographic calcification found on diagnostic imaging of breast: Secondary | ICD-10-CM

## 2023-08-25 ENCOUNTER — Ambulatory Visit
Admission: RE | Admit: 2023-08-25 | Discharge: 2023-08-25 | Disposition: A | Discharge status: Home / Self Care | Source: Ambulatory Visit | Attending: Family Medicine | Admitting: Family Medicine

## 2023-08-25 DIAGNOSIS — R921 Mammographic calcification found on diagnostic imaging of breast: Secondary | ICD-10-CM

## 2023-08-25 DIAGNOSIS — N6489 Other specified disorders of breast: Secondary | ICD-10-CM | POA: Diagnosis not present

## 2023-08-25 HISTORY — PX: BREAST BIOPSY: SHX20

## 2023-08-26 LAB — SURGICAL PATHOLOGY

## 2023-09-27 ENCOUNTER — Other Ambulatory Visit (HOSPITAL_BASED_OUTPATIENT_CLINIC_OR_DEPARTMENT_OTHER): Payer: Self-pay

## 2023-09-27 MED ORDER — COMIRNATY 30 MCG/0.3ML IM SUSY
0.3000 mL | PREFILLED_SYRINGE | Freq: Once | INTRAMUSCULAR | 0 refills | Status: AC
  Filled 2023-09-27: qty 0.3, 1d supply, fill #0

## 2023-10-04 DIAGNOSIS — D2261 Melanocytic nevi of right upper limb, including shoulder: Secondary | ICD-10-CM | POA: Diagnosis not present

## 2023-10-04 DIAGNOSIS — I788 Other diseases of capillaries: Secondary | ICD-10-CM | POA: Diagnosis not present

## 2023-10-04 DIAGNOSIS — L570 Actinic keratosis: Secondary | ICD-10-CM | POA: Diagnosis not present

## 2023-10-04 DIAGNOSIS — L821 Other seborrheic keratosis: Secondary | ICD-10-CM | POA: Diagnosis not present

## 2023-10-04 DIAGNOSIS — D225 Melanocytic nevi of trunk: Secondary | ICD-10-CM | POA: Diagnosis not present

## 2023-10-21 ENCOUNTER — Other Ambulatory Visit (HOSPITAL_BASED_OUTPATIENT_CLINIC_OR_DEPARTMENT_OTHER): Payer: Self-pay

## 2023-10-21 ENCOUNTER — Other Ambulatory Visit: Payer: Self-pay

## 2023-10-25 ENCOUNTER — Other Ambulatory Visit (HOSPITAL_BASED_OUTPATIENT_CLINIC_OR_DEPARTMENT_OTHER): Payer: Self-pay

## 2023-11-25 ENCOUNTER — Encounter (HOSPITAL_BASED_OUTPATIENT_CLINIC_OR_DEPARTMENT_OTHER): Payer: Self-pay | Admitting: Family Medicine

## 2023-11-25 ENCOUNTER — Ambulatory Visit (INDEPENDENT_AMBULATORY_CARE_PROVIDER_SITE_OTHER): Admitting: Family Medicine

## 2023-11-25 ENCOUNTER — Other Ambulatory Visit (HOSPITAL_BASED_OUTPATIENT_CLINIC_OR_DEPARTMENT_OTHER): Payer: Self-pay

## 2023-11-25 VITALS — BP 133/72 | HR 82 | Wt 147.0 lb

## 2023-11-25 DIAGNOSIS — Z0000 Encounter for general adult medical examination without abnormal findings: Secondary | ICD-10-CM

## 2023-11-25 DIAGNOSIS — N951 Menopausal and female climacteric states: Secondary | ICD-10-CM

## 2023-11-25 DIAGNOSIS — K219 Gastro-esophageal reflux disease without esophagitis: Secondary | ICD-10-CM | POA: Diagnosis not present

## 2023-11-25 MED ORDER — OMEPRAZOLE 20 MG PO CPDR
20.0000 mg | DELAYED_RELEASE_CAPSULE | Freq: Every day | ORAL | 1 refills | Status: AC
  Filled 2023-11-25 (×2): qty 90, 90d supply, fill #0

## 2023-11-25 MED ORDER — ESCITALOPRAM OXALATE 5 MG PO TABS
5.0000 mg | ORAL_TABLET | Freq: Every day | ORAL | 1 refills | Status: AC
Start: 2023-11-25 — End: ?
  Filled 2023-11-25 (×2): qty 30, 30d supply, fill #0

## 2023-11-25 NOTE — Assessment & Plan Note (Signed)
 We reviewed options today.  This has been ongoing longstanding issue for patient over the past 20+ years.  She notes that she has tried antidepressants in the past.  Does not recall any of hormonal therapies in the past.  Rachell has been discussed before, however indicates that she has not tried this. After discussing options, we can proceed initially with SSRI, starting with low-dose approach at low-dose.  Did discuss that if symptoms persist after 2 weeks, can increase from 5 mg up to 10 mg.  Will plan to assess progress in about 1 month.  Depending on progress, can continue with Lexapro  at that time, consider adjusting dose, consider switching to alternative antidepressant.

## 2023-11-25 NOTE — Assessment & Plan Note (Signed)
 Has currently been taking pantoprazole , however symptoms not completely controlled.  She was switched to this by her cardiologist, however has never really had any good control with pantoprazole .  She notes that she will occasionally use omeprazole  over-the-counter and finds that this provides a bit more relief for her.  We discussed options, can proceed with regular use of omeprazole  at this time.  Will have patient stop pantoprazole  and start omeprazole  and we can monitor progress at next appointment in about 1 month

## 2023-11-25 NOTE — Progress Notes (Signed)
 Subjective:    CC: Annual Physical Exam  HPI:  Brittany Harper is a 78 y.o. presenting for annual physical  I reviewed the past medical history, family history, social history, surgical history, and allergies today and no changes were needed.  Please see the problem list section below in epic for further details.  Past Medical History: Past Medical History:  Diagnosis Date   Allergy    Arthritis    Asthma    Cancer (HCC)    basal cell   Dysrhythmia    A. fib   Gastric ulcer without hemorrhage or perforation    GERD (gastroesophageal reflux disease)    Hiatal hernia with GERD and esophagitis 10/16/2018   Hx of skin cancer, basal cell    Hyperlipidemia    Memory change 04/05/2016   Past Surgical History: Past Surgical History:  Procedure Laterality Date   ABDOMINAL HYSTERECTOMY     ATRIAL FIBRILLATION ABLATION N/A 08/31/2022   Procedure: ATRIAL FIBRILLATION ABLATION;  Surgeon: Cindie Ole DASEN, MD;  Location: [facility];  Service: Cardiovascular;  Laterality: N/A;   BIOPSY  10/16/2018   Procedure: BIOPSY;  Surgeon: San Sandor GAILS, DO;  Location: [facility];  Service: Gastroenterology;;   BREAST BIOPSY Left 08/25/2023   MM LT BREAST BX W LOC DEV 1ST LESION IMAGE BX SPEC STEREO GUIDE 08/25/2023 [facility]   CATARACT EXTRACTION Bilateral    COLONOSCOPY     ESOPHAGOGASTRODUODENOSCOPY (EGD) WITH PROPOFOL  N/A 10/16/2018   Procedure: ESOPHAGOGASTRODUODENOSCOPY (EGD) WITH PROPOFOL ;  Surgeon: San Sandor GAILS, DO;  Location: [facility];  Service: Gastroenterology;  Laterality: N/A;   LUNG SURGERY Left 04/07/2022   NODE DISSECTION Left 04/07/2022   Procedure: NODE DISSECTION;  Surgeon: Shyrl Linnie KIDD, MD;  Location: [facility];  Service: Thoracic;  Laterality: Left;   TRANSORAL INCISIONLESS FUNDOPLICATION N/A 10/16/2018   Procedure: TRANSORAL INCISIONLESS FUNDOPLICATION;  Surgeon: San Sandor GAILS, DO;  Location: [facility];  Service: Gastroenterology;   Laterality: N/A;   Social History: Social History   Socioeconomic History   Marital status: Widowed    Spouse name: Not on file   Number of children: 4   Years of education: 40   Highest education level: Master's degree (e.g., MA, MS, MEng, MEd, MSW, MBA)  Occupational History   Occupation: retired runner, broadcasting/film/video   Tobacco Use   Smoking status: Never   Smokeless tobacco: Never   Tobacco comments:    Never smoked 09/28/22  Vaping Use   Vaping status: Never Used  Substance and Sexual Activity   Alcohol  use: Yes    Alcohol /week: 2.0 - 3.0 standard drinks of alcohol     Types: 1 Glasses of wine, 1 - 2 Standard drinks or equivalent per week    Comment: 1-2 glasses of wine weekly 09/28/22   Drug use: No   Sexual activity: Not Currently  Other Topics Concern   Not on file  Social History Narrative   Lives in [facility]. Actively involved with social interactions and the community.    Social Drivers of Corporate Investment Banker Strain: Low Risk  (11/21/2023)   Overall Financial Resource Strain (CARDIA)    Difficulty of Paying Living Expenses: Not very hard  Food Insecurity: No Food Insecurity (11/21/2023)   Hunger Vital Sign    Worried About Running Out of Food in the Last Year: Never true    Ran Out of Food in the Last Year: Never true  Transportation Needs: No Transportation Needs (11/21/2023)   PRAPARE -  Administrator, Civil Service (Medical): No    Lack of Transportation (Non-Medical): No  Physical Activity: Sufficiently Active (11/21/2023)   Exercise Vital Sign    Days of Exercise per Week: 5 days    Minutes of Exercise per Session: 30 min  Stress: No Stress Concern Present (11/21/2023)   [employer] of Occupational Health - Occupational Stress Questionnaire    Feeling of Stress: Only a little  Social Connections: Moderately Integrated (11/21/2023)   Social Connection and Isolation Panel    Frequency of Communication with Friends and  Family: Twice a week    Frequency of Social Gatherings with Friends and Family: More than three times a week    Attends Religious Services: More than 4 times per year    Active Member of [employer] or Organizations: Yes    Attends Banker Meetings: More than 4 times per year    Marital Status: Widowed   Family History: Family History  Problem Relation Age of Onset   Arthritis Mother    Breast cancer Mother        breast and ovarian, and basal cell   Ovarian cancer Mother    Basal cell carcinoma Mother    Arthritis Father    Basal cell carcinoma Father        basal cell   Heart attack Father    Diabetes Sister    Obesity Sister    Arthritis Sister    Diabetes Paternal Uncle    Diabetes Paternal Grandmother    Diabetes Paternal Grandfather    Stomach cancer Paternal Wylie Gavel' disease Maternal Grandfather    Heart attack Maternal Grandfather    Colon cancer Neg Hx    Pancreatic cancer Neg Hx    Allergies: Allergies  Allergen Reactions   Ivp Dye [Iodinated Contrast Media] Shortness Of Breath and Palpitations    Feels hot   Augmentin [Amoxicillin-Pot Clavulanate] Nausea And Vomiting   Demerol [Meperidine] Nausea And Vomiting   Monosodium Glutamate Other (See Comments)    Heart racing and hot feeling.   Morphine And Codeine Nausea And Vomiting   Shellfish Allergy Palpitations    Feels hot   Medications: See med rec.  Review of Systems: No headache, visual changes, nausea, vomiting, diarrhea, constipation, dizziness, abdominal pain, skin rash, fevers, chills, night sweats, swollen lymph nodes, weight loss, chest pain, body aches, joint swelling, muscle aches, shortness of breath, mood changes, visual or auditory hallucinations.  Objective:    BP 133/72 (BP Location: Left Arm, Patient Position: Sitting, Cuff Size: Small)   Pulse 82   Wt 147 lb (66.7 kg)   SpO2 99%   BMI 25.63 kg/m   General: Well Developed, well nourished, and in no acute  distress. Neuro: Alert and oriented x3, extra-ocular muscles intact, sensation grossly intact. Cranial nerves II through XII are intact, motor, sensory, and coordinative functions are all intact. HEENT: Normocephalic, atraumatic, pupils equal round reactive to light, neck supple, no masses, no lymphadenopathy, thyroid  nonpalpable. Oropharynx, nasopharynx, external ear canals are unremarkable. Skin: Warm and dry, no rashes noted. Cardiac: Regular rate and rhythm, no murmurs rubs or gallops. Respiratory: Clear to auscultation bilaterally. Not using accessory muscles, speaking in full sentences. Abdominal: Soft, nontender, nondistended, positive bowel sounds, no masses, no organomegaly. Musculoskeletal: Shoulder, elbow, wrist, hip, knee, ankle stable, and with full range of motion.  Impression and Recommendations:    Wellness examination Assessment & Plan: Routine HCM labs ordered. HCM reviewed/discussed. Anticipatory guidance regarding  healthy weight, lifestyle and choices given. Recommend healthy diet.  Recommend approximately 150 minutes/week of moderate intensity exercise Recommend regular dental and vision exams Always use seatbelt/lap and shoulder restraints Recommend using smoke alarms and checking batteries at least twice a year Recommend using sunscreen when outside Discussed immunization recommendations  Orders: -     CBC with Differential/Platelet -     Comprehensive metabolic panel with GFR -     Lipid panel  Gastroesophageal reflux disease, unspecified whether esophagitis present Assessment & Plan: Has currently been taking pantoprazole , however symptoms not completely controlled.  She was switched to this by her cardiologist, however has never really had any good control with pantoprazole .  She notes that she will occasionally use omeprazole  over-the-counter and finds that this provides a bit more relief for her.  We discussed options, can proceed with regular use of omeprazole   at this time.  Will have patient stop pantoprazole  and start omeprazole  and we can monitor progress at next appointment in about 1 month   Vasomotor symptoms due to menopause Assessment & Plan: We reviewed options today.  This has been ongoing longstanding issue for patient over the past 20+ years.  She notes that she has tried antidepressants in the past.  Does not recall any of hormonal therapies in the past.  Rachell has been discussed before, however indicates that she has not tried this. After discussing options, we can proceed initially with SSRI, starting with low-dose approach at low-dose.  Did discuss that if symptoms persist after 2 weeks, can increase from 5 mg up to 10 mg.  Will plan to assess progress in about 1 month.  Depending on progress, can continue with Lexapro  at that time, consider adjusting dose, consider switching to alternative antidepressant.   Other orders -     Escitalopram  Oxalate; Take 1 tablet (5 mg total) by mouth daily.  Dispense: 30 tablet; Refill: 1 -     Omeprazole ; Take 1 capsule (20 mg total) by mouth daily.  Dispense: 90 capsule; Refill: 1  Return in about 4 weeks (around 12/23/2023) for med check.   ___________________________________________ Kenisha Lynds de [place], MD, ABFM, [facility]

## 2023-11-25 NOTE — Assessment & Plan Note (Signed)

## 2023-11-29 LAB — COMPREHENSIVE METABOLIC PANEL WITH GFR
ALT: 16 IU/L (ref 0–32)
AST: 22 IU/L (ref 0–40)
Albumin: 4.2 g/dL (ref 3.8–4.8)
Alkaline Phosphatase: 74 IU/L (ref 49–135)
BUN/Creatinine Ratio: 14 (ref 12–28)
BUN: 13 mg/dL (ref 8–27)
Bilirubin Total: 0.4 mg/dL (ref 0.0–1.2)
CO2: 23 mmol/L (ref 20–29)
Calcium: 9.3 mg/dL (ref 8.7–10.3)
Chloride: 103 mmol/L (ref 96–106)
Creatinine, Ser: 0.9 mg/dL (ref 0.57–1.00)
Globulin, Total: 2.2 g/dL (ref 1.5–4.5)
Glucose: 93 mg/dL (ref 70–99)
Potassium: 4.1 mmol/L (ref 3.5–5.2)
Sodium: 141 mmol/L (ref 134–144)
Total Protein: 6.4 g/dL (ref 6.0–8.5)
eGFR: 65 mL/min/1.73 (ref >59)

## 2023-11-29 LAB — CBC WITH DIFFERENTIAL/PLATELET
Basophils Absolute: 0 x10E3/uL (ref 0.0–0.2)
Basos: 1 %
EOS (ABSOLUTE): 0.2 x10E3/uL (ref 0.0–0.4)
Eos: 3 %
Hematocrit: 43.3 % (ref 34.0–46.6)
Hemoglobin: 13.9 g/dL (ref 11.1–15.9)
Immature Grans (Abs): 0 x10E3/uL (ref 0.0–0.1)
Immature Granulocytes: 0 %
Lymphocytes Absolute: 1.9 x10E3/uL (ref 0.7–3.1)
Lymphs: 32 %
MCH: 30 pg (ref 26.6–33.0)
MCHC: 32.1 g/dL (ref 31.5–35.7)
MCV: 93 fL (ref 79–97)
Monocytes Absolute: 0.6 x10E3/uL (ref 0.1–0.9)
Monocytes: 10 %
Neutrophils Absolute: 3.2 x10E3/uL (ref 1.4–7.0)
Neutrophils: 54 %
Platelets: 247 x10E3/uL (ref 150–450)
RBC: 4.64 x10E6/uL (ref 3.77–5.28)
RDW: 12.5 % (ref 11.7–15.4)
WBC: 5.8 x10E3/uL (ref 3.4–10.8)

## 2023-11-29 LAB — LIPID PANEL
Chol/HDL Ratio: 2.7 ratio (ref 0.0–4.4)
Cholesterol, Total: 177 mg/dL (ref 100–199)
HDL: 65 mg/dL (ref >39)
LDL Chol Calc (NIH): 92 mg/dL (ref 0–99)
Triglycerides: 115 mg/dL (ref 0–149)
VLDL Cholesterol Cal: 20 mg/dL (ref 5–40)

## 2023-11-30 ENCOUNTER — Ambulatory Visit (HOSPITAL_BASED_OUTPATIENT_CLINIC_OR_DEPARTMENT_OTHER): Payer: Self-pay | Admitting: Family Medicine

## 2023-12-23 ENCOUNTER — Ambulatory Visit (HOSPITAL_BASED_OUTPATIENT_CLINIC_OR_DEPARTMENT_OTHER): Admitting: Family Medicine

## 2024-01-10 ENCOUNTER — Other Ambulatory Visit: Payer: Self-pay | Admitting: Family Medicine

## 2024-01-10 DIAGNOSIS — R921 Mammographic calcification found on diagnostic imaging of breast: Secondary | ICD-10-CM

## 2024-01-26 ENCOUNTER — Other Ambulatory Visit (HOSPITAL_BASED_OUTPATIENT_CLINIC_OR_DEPARTMENT_OTHER): Payer: Self-pay

## 2024-01-26 ENCOUNTER — Other Ambulatory Visit: Payer: Self-pay

## 2024-01-26 ENCOUNTER — Other Ambulatory Visit: Payer: Self-pay | Admitting: Cardiology

## 2024-01-26 DIAGNOSIS — I480 Paroxysmal atrial fibrillation: Secondary | ICD-10-CM

## 2024-01-26 MED ORDER — APIXABAN 5 MG PO TABS
5.0000 mg | ORAL_TABLET | Freq: Two times a day (BID) | ORAL | 3 refills | Status: AC
  Filled 2024-01-26: qty 180, 90d supply, fill #0
  Filled 2024-01-27: qty 60, 30d supply, fill #0

## 2024-01-27 ENCOUNTER — Other Ambulatory Visit (HOSPITAL_BASED_OUTPATIENT_CLINIC_OR_DEPARTMENT_OTHER): Payer: Self-pay

## 2024-02-27 ENCOUNTER — Encounter

## 2024-04-23 ENCOUNTER — Ambulatory Visit: Admitting: Cardiology

## 2024-05-29 ENCOUNTER — Encounter (HOSPITAL_BASED_OUTPATIENT_CLINIC_OR_DEPARTMENT_OTHER)
